# Patient Record
Sex: Female | Born: 1935 | Race: White | Hispanic: No | Marital: Married | Smoking: Never smoker
Health system: Southern US, Community
[De-identification: ages and names within clinical notes are randomized; demographics above are authoritative.]

## PROBLEM LIST (undated history)

## (undated) DIAGNOSIS — J449 Chronic obstructive pulmonary disease, unspecified: Secondary | ICD-10-CM

## (undated) DIAGNOSIS — I509 Heart failure, unspecified: Secondary | ICD-10-CM

## (undated) DIAGNOSIS — E785 Hyperlipidemia, unspecified: Secondary | ICD-10-CM

## (undated) DIAGNOSIS — E119 Type 2 diabetes mellitus without complications: Secondary | ICD-10-CM

## (undated) DIAGNOSIS — K7460 Unspecified cirrhosis of liver: Secondary | ICD-10-CM

## (undated) HISTORY — PX: ESOPHAGOGASTRODUODENOSCOPY: SHX1529

## (undated) HISTORY — PX: BLADDER REPAIR: SHX76

## (undated) HISTORY — DX: Hyperlipidemia, unspecified: E78.5

## (undated) HISTORY — PX: APPENDECTOMY: SHX54

## (undated) HISTORY — PX: GALLBLADDER SURGERY: SHX652

## (undated) HISTORY — PX: COLONOSCOPY: SHX174

## (undated) HISTORY — PX: ABDOMINAL HYSTERECTOMY: SHX81

---

## 2007-03-15 ENCOUNTER — Encounter (INDEPENDENT_AMBULATORY_CARE_PROVIDER_SITE_OTHER): Payer: Self-pay | Admitting: Interventional Radiology

## 2007-03-15 ENCOUNTER — Ambulatory Visit (HOSPITAL_COMMUNITY): Admission: RE | Admit: 2007-03-15 | Discharge: 2007-03-15 | Payer: Self-pay | Admitting: Internal Medicine

## 2007-04-05 ENCOUNTER — Ambulatory Visit (HOSPITAL_COMMUNITY): Admission: RE | Admit: 2007-04-05 | Discharge: 2007-04-05 | Payer: Self-pay | Admitting: Internal Medicine

## 2007-05-23 ENCOUNTER — Ambulatory Visit: Payer: Self-pay | Admitting: Cardiology

## 2007-07-02 ENCOUNTER — Ambulatory Visit: Payer: Self-pay | Admitting: Cardiology

## 2007-07-29 ENCOUNTER — Ambulatory Visit: Payer: Self-pay | Admitting: Cardiology

## 2007-09-13 ENCOUNTER — Ambulatory Visit (HOSPITAL_COMMUNITY): Admission: RE | Admit: 2007-09-13 | Discharge: 2007-09-13 | Payer: Self-pay | Admitting: Internal Medicine

## 2010-02-12 ENCOUNTER — Ambulatory Visit (HOSPITAL_COMMUNITY)
Admission: RE | Admit: 2010-02-12 | Discharge: 2010-02-12 | Payer: Self-pay | Source: Home / Self Care | Attending: Internal Medicine | Admitting: Internal Medicine

## 2010-03-31 ENCOUNTER — Encounter (HOSPITAL_COMMUNITY): Payer: Self-pay | Admitting: Internal Medicine

## 2010-04-05 ENCOUNTER — Ambulatory Visit (HOSPITAL_COMMUNITY)
Admission: RE | Admit: 2010-04-05 | Discharge: 2010-04-05 | Payer: Self-pay | Source: Home / Self Care | Attending: Internal Medicine | Admitting: Internal Medicine

## 2010-04-08 ENCOUNTER — Ambulatory Visit: Admit: 2010-04-08 | Payer: Self-pay | Admitting: Internal Medicine

## 2010-04-13 NOTE — Op Note (Signed)
  NAMELATERA, MCLIN             ACCOUNT NO.:  [id number]  MEDICAL RECORD NO.:  [id number]          PATIENT TYPE:  AMB  LOCATION:  DAY                           FACILITY:  [facility]  PHYSICIAN:  Lionel December, M.D.    DATE OF BIRTH:  Sep 05, 1935  DATE OF PROCEDURE:  04/05/2010 DATE OF DISCHARGE:  04/05/2010                              OPERATIVE REPORT   PROCEDURE:  Small bowel given capsule study.  INDICATION:  Ms. Renninger is 75 year old Caucasian female with GI bleed of occult origin and anemia.  She presented with history of dark stools and/or melena and anemia and received 2 units of PRBCs.  She had EGD and a colonoscopy at [facility] in [place] and no source of blood loss was found.  She was therefore referred over for small bowel given capsule study.  The patient told us that she had an episode back in 2009 and required 2 units of PRBCs prior to diagnosis and therapy of her lymphoma.  Procedure risks were reviewed with the patient and informed consent was obtained.  FINDINGS:  The patient was able to swallow given capsule without any difficulty.  Capsule reached her stomach in less than 1 minute and the duodenal bulb in 33 minutes and 35 seconds and ileocecal valve in 2 hours 51 minutes and 38 seconds and cecum at 2 hours 51 minutes and 39 seconds.  There was focal prominence to bulbar mucosa but this was only seen in frame at 35 minutes and 0 second and felt to be possibly an artifact. Rest of the small bowel mucosa was normal.  In the stomach, there are multiple telangiectasia at antrum.  FINAL DIAGNOSES: 1. 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 2. Normal small bowel study.  RECOMMENDATIONS:  The patient advised to continue hold off oral iron therapy.  If she experience melena, we would consider EGD with APC ablation of these vascular lesions in the stomach.  However, if she has bright red blood per rectum, may consider GI bleeding  scan first.  If we are not able to maintain her hemoglobin up to a respectable level or above 10 g and her stools are always heme-positive, we would consider exam in her upper GI tract and APC ablation of these telangiectasia.     Lionel December, M.D.     NR/MEDQ  D:  04/11/2010  T:  04/12/2010  Job:  [id number]  cc:   Lia Hopping Fax: [phone]  Normand Sloop, MD Fax: [phone]  Electronically Signed by Lionel December M.D. on 04/13/2010 02:02:25 PM

## 2010-07-12 DIAGNOSIS — R079 Chest pain, unspecified: Secondary | ICD-10-CM

## 2010-07-23 NOTE — Assessment & Plan Note (Signed)
[facility]                          [facility] OFFICE NOTE   NAME:Brooke Gutierrez, Brooke Gutierrez                    MRN:          [id number]  DATE:07/02/2007                            DOB:          28-Feb-1936    REFERRING PHYSICIAN:  Erasmo Downer, MD   REFERRING PHYSICIAN:  Dr. Erasmo Downer and Dr. Ubaldo Glassing.   HISTORY OF PRESENT ILLNESS:  The patient is a 75 year old female with  Hodgkin's lymphoma undergoing chemotherapy.  The patient was  hospitalized in March with ectopic atrial tachycardia and started on  Cardizem.  She reports now several recurrences of palpitations.  She was  also admitted on June 11, 2007, with hyperglycemia.  Although the  records indicate the patient has history of atrial fibrillation this  is not correct and this was never documented.  All prior tracings  demonstrate a narrow complex QRS tachycardia.  The patient states that  on exertion and sometimes at rest she has palpitations almost occurring  on a daily basis.  She has no chest pain or shortness of breath.   MEDICATIONS:  1. Lisinopril 5 mg a day.  2. Nitrofur 100 mg p.o. daily.  3. Imipramine.  4. TriCor.  5. Metformin.  6. Diltiazem 360 daily.  7. Protonix 40 daily.  8. Humalog.  9. NovoLog.  10.Lantus insulin.   PHYSICAL EXAMINATION:  VITAL SIGNS:  Blood pressure is 102/64, heart  rate 93, weight 154 pounds.  NECK:  Normal carotid upstroke, no carotid bruits.  LUNGS:  Clear breath sounds bilaterally.  HEART:  Regular rate and rhythm.  Normal S1, S2.  No murmurs, rubs or  gallops.  ABDOMEN:  Soft, nontender, no rebound or guarding.  Good bowel sounds.  EXTREMITIES:  No clubbing, cyanosis or edema.   PROBLEM LIST:  1. History of Hodgkin's lymphoma.  2. Status post chemotherapy.  3. History of pleuritic chest pain negative for PE by CT scan.  4. Atrial tachycardia, possible recurrence.  5. No evidence of structural heart disease by 2-D echo despite  prior      use of Adriamycin.   PLAN:  1. A followup echo will be obtained in the next couple of weeks but I      do not think the patient has cardiomyopathy.  2. The patient will be given a cardiac monitor to reassess her      arrhythmia burden.  3. Have given the patient a prescription for Lopressor as needed if      she has recurrent palpitations to use in conjunction with her      Cardizem.     Learta Codding, MD,FACC  Electronically Signed    GED/MedQ  DD: 07/02/2007  DT: 07/02/2007  Job #: [id number]   cc:   Lebron Conners. Darovsky, M.D.  Erasmo Downer, MD

## 2010-11-28 LAB — CBC
HCT: 35.2 — ABNORMAL LOW
MCV: 83.4
Platelets: 200
WBC: 4.3

## 2011-05-06 DIAGNOSIS — I5033 Acute on chronic diastolic (congestive) heart failure: Secondary | ICD-10-CM

## 2011-08-01 ENCOUNTER — Encounter: Payer: Medicare Other | Admitting: Internal Medicine

## 2011-08-01 DIAGNOSIS — D696 Thrombocytopenia, unspecified: Secondary | ICD-10-CM

## 2011-08-01 DIAGNOSIS — R161 Splenomegaly, not elsewhere classified: Secondary | ICD-10-CM

## 2011-08-01 DIAGNOSIS — D649 Anemia, unspecified: Secondary | ICD-10-CM

## 2011-08-01 DIAGNOSIS — C8190 Hodgkin lymphoma, unspecified, unspecified site: Secondary | ICD-10-CM

## 2011-09-12 DIAGNOSIS — C8589 Other specified types of non-Hodgkin lymphoma, extranodal and solid organ sites: Secondary | ICD-10-CM

## 2011-09-12 DIAGNOSIS — Z452 Encounter for adjustment and management of vascular access device: Secondary | ICD-10-CM

## 2011-09-25 ENCOUNTER — Encounter (INDEPENDENT_AMBULATORY_CARE_PROVIDER_SITE_OTHER): Payer: Medicare Other | Admitting: Internal Medicine

## 2011-09-25 DIAGNOSIS — C8190 Hodgkin lymphoma, unspecified, unspecified site: Secondary | ICD-10-CM

## 2011-09-25 DIAGNOSIS — K7460 Unspecified cirrhosis of liver: Secondary | ICD-10-CM

## 2011-10-28 DIAGNOSIS — C8589 Other specified types of non-Hodgkin lymphoma, extranodal and solid organ sites: Secondary | ICD-10-CM

## 2011-10-28 DIAGNOSIS — Z452 Encounter for adjustment and management of vascular access device: Secondary | ICD-10-CM

## 2011-12-10 ENCOUNTER — Encounter (INDEPENDENT_AMBULATORY_CARE_PROVIDER_SITE_OTHER): Payer: Self-pay | Admitting: Internal Medicine

## 2011-12-10 DIAGNOSIS — Z452 Encounter for adjustment and management of vascular access device: Secondary | ICD-10-CM

## 2011-12-10 DIAGNOSIS — C8589 Other specified types of non-Hodgkin lymphoma, extranodal and solid organ sites: Secondary | ICD-10-CM

## 2012-01-21 DIAGNOSIS — C8589 Other specified types of non-Hodgkin lymphoma, extranodal and solid organ sites: Secondary | ICD-10-CM

## 2012-01-21 DIAGNOSIS — Z452 Encounter for adjustment and management of vascular access device: Secondary | ICD-10-CM

## 2012-03-01 DIAGNOSIS — Z452 Encounter for adjustment and management of vascular access device: Secondary | ICD-10-CM

## 2012-03-01 DIAGNOSIS — C8190 Hodgkin lymphoma, unspecified, unspecified site: Secondary | ICD-10-CM

## 2012-04-13 DIAGNOSIS — C8190 Hodgkin lymphoma, unspecified, unspecified site: Secondary | ICD-10-CM

## 2012-04-13 DIAGNOSIS — Z452 Encounter for adjustment and management of vascular access device: Secondary | ICD-10-CM

## 2012-05-25 DIAGNOSIS — Z452 Encounter for adjustment and management of vascular access device: Secondary | ICD-10-CM

## 2012-05-25 DIAGNOSIS — C8190 Hodgkin lymphoma, unspecified, unspecified site: Secondary | ICD-10-CM

## 2012-07-06 DIAGNOSIS — C8190 Hodgkin lymphoma, unspecified, unspecified site: Secondary | ICD-10-CM

## 2012-07-06 DIAGNOSIS — Z452 Encounter for adjustment and management of vascular access device: Secondary | ICD-10-CM

## 2012-08-17 DIAGNOSIS — Z452 Encounter for adjustment and management of vascular access device: Secondary | ICD-10-CM

## 2012-08-17 DIAGNOSIS — C8190 Hodgkin lymphoma, unspecified, unspecified site: Secondary | ICD-10-CM

## 2012-09-16 DIAGNOSIS — C8190 Hodgkin lymphoma, unspecified, unspecified site: Secondary | ICD-10-CM

## 2012-09-28 DIAGNOSIS — Z452 Encounter for adjustment and management of vascular access device: Secondary | ICD-10-CM

## 2012-09-28 DIAGNOSIS — C8190 Hodgkin lymphoma, unspecified, unspecified site: Secondary | ICD-10-CM

## 2012-11-09 DIAGNOSIS — Z452 Encounter for adjustment and management of vascular access device: Secondary | ICD-10-CM

## 2012-11-09 DIAGNOSIS — C8589 Other specified types of non-Hodgkin lymphoma, extranodal and solid organ sites: Secondary | ICD-10-CM

## 2012-12-22 DIAGNOSIS — Z452 Encounter for adjustment and management of vascular access device: Secondary | ICD-10-CM

## 2012-12-22 DIAGNOSIS — C8190 Hodgkin lymphoma, unspecified, unspecified site: Secondary | ICD-10-CM

## 2013-02-02 DIAGNOSIS — Z452 Encounter for adjustment and management of vascular access device: Secondary | ICD-10-CM

## 2013-02-02 DIAGNOSIS — C8190 Hodgkin lymphoma, unspecified, unspecified site: Secondary | ICD-10-CM

## 2014-10-11 ENCOUNTER — Other Ambulatory Visit: Payer: Self-pay | Admitting: Internal Medicine

## 2014-10-11 DIAGNOSIS — R921 Mammographic calcification found on diagnostic imaging of breast: Secondary | ICD-10-CM

## 2014-10-17 ENCOUNTER — Ambulatory Visit
Admission: RE | Admit: 2014-10-17 | Discharge: 2014-10-17 | Disposition: A | Discharge status: Home / Self Care | Payer: Medicare Other | Source: Ambulatory Visit | Attending: Internal Medicine | Admitting: Internal Medicine

## 2014-10-17 ENCOUNTER — Other Ambulatory Visit: Payer: Self-pay | Admitting: Internal Medicine

## 2014-10-17 DIAGNOSIS — R921 Mammographic calcification found on diagnostic imaging of breast: Secondary | ICD-10-CM

## 2015-04-16 DIAGNOSIS — J45909 Unspecified asthma, uncomplicated: Secondary | ICD-10-CM | POA: Diagnosis not present

## 2015-04-16 DIAGNOSIS — J441 Chronic obstructive pulmonary disease with (acute) exacerbation: Secondary | ICD-10-CM | POA: Diagnosis not present

## 2015-04-16 DIAGNOSIS — C8588 Other specified types of non-Hodgkin lymphoma, lymph nodes of multiple sites: Secondary | ICD-10-CM | POA: Diagnosis not present

## 2015-04-16 DIAGNOSIS — E785 Hyperlipidemia, unspecified: Secondary | ICD-10-CM | POA: Diagnosis not present

## 2015-04-16 DIAGNOSIS — G8929 Other chronic pain: Secondary | ICD-10-CM | POA: Diagnosis not present

## 2015-04-16 DIAGNOSIS — K7581 Nonalcoholic steatohepatitis (NASH): Secondary | ICD-10-CM | POA: Diagnosis not present

## 2015-04-16 DIAGNOSIS — C8590 Non-Hodgkin lymphoma, unspecified, unspecified site: Secondary | ICD-10-CM | POA: Diagnosis not present

## 2015-04-16 DIAGNOSIS — E119 Type 2 diabetes mellitus without complications: Secondary | ICD-10-CM | POA: Diagnosis not present

## 2015-04-16 DIAGNOSIS — N12 Tubulo-interstitial nephritis, not specified as acute or chronic: Secondary | ICD-10-CM | POA: Diagnosis not present

## 2015-04-16 DIAGNOSIS — R52 Pain, unspecified: Secondary | ICD-10-CM | POA: Diagnosis not present

## 2015-04-16 DIAGNOSIS — I10 Essential (primary) hypertension: Secondary | ICD-10-CM | POA: Diagnosis not present

## 2015-04-16 DIAGNOSIS — J449 Chronic obstructive pulmonary disease, unspecified: Secondary | ICD-10-CM | POA: Diagnosis not present

## 2015-04-26 DIAGNOSIS — N3000 Acute cystitis without hematuria: Secondary | ICD-10-CM | POA: Diagnosis not present

## 2015-04-26 DIAGNOSIS — Z6830 Body mass index (BMI) 30.0-30.9, adult: Secondary | ICD-10-CM | POA: Diagnosis not present

## 2015-04-27 DIAGNOSIS — E119 Type 2 diabetes mellitus without complications: Secondary | ICD-10-CM | POA: Diagnosis not present

## 2015-05-03 DIAGNOSIS — Z95828 Presence of other vascular implants and grafts: Secondary | ICD-10-CM | POA: Diagnosis not present

## 2015-05-03 DIAGNOSIS — I864 Gastric varices: Secondary | ICD-10-CM | POA: Diagnosis not present

## 2015-05-03 DIAGNOSIS — D638 Anemia in other chronic diseases classified elsewhere: Secondary | ICD-10-CM | POA: Diagnosis not present

## 2015-05-03 DIAGNOSIS — C8120 Mixed cellularity classical Hodgkin lymphoma, unspecified site: Secondary | ICD-10-CM | POA: Diagnosis not present

## 2015-05-03 DIAGNOSIS — D696 Thrombocytopenia, unspecified: Secondary | ICD-10-CM | POA: Diagnosis not present

## 2015-05-03 DIAGNOSIS — K745 Biliary cirrhosis, unspecified: Secondary | ICD-10-CM | POA: Diagnosis not present

## 2015-05-08 DIAGNOSIS — R339 Retention of urine, unspecified: Secondary | ICD-10-CM | POA: Diagnosis not present

## 2015-05-28 DIAGNOSIS — Z95828 Presence of other vascular implants and grafts: Secondary | ICD-10-CM | POA: Diagnosis not present

## 2015-06-12 DIAGNOSIS — R339 Retention of urine, unspecified: Secondary | ICD-10-CM | POA: Diagnosis not present

## 2015-06-22 DIAGNOSIS — E1165 Type 2 diabetes mellitus with hyperglycemia: Secondary | ICD-10-CM | POA: Diagnosis not present

## 2015-07-09 DIAGNOSIS — Z95828 Presence of other vascular implants and grafts: Secondary | ICD-10-CM | POA: Diagnosis not present

## 2015-07-10 DIAGNOSIS — R339 Retention of urine, unspecified: Secondary | ICD-10-CM | POA: Diagnosis not present

## 2015-07-30 DIAGNOSIS — I10 Essential (primary) hypertension: Secondary | ICD-10-CM | POA: Diagnosis not present

## 2015-07-30 DIAGNOSIS — Z6830 Body mass index (BMI) 30.0-30.9, adult: Secondary | ICD-10-CM | POA: Diagnosis not present

## 2015-07-30 DIAGNOSIS — E119 Type 2 diabetes mellitus without complications: Secondary | ICD-10-CM | POA: Diagnosis not present

## 2015-07-30 DIAGNOSIS — E1165 Type 2 diabetes mellitus with hyperglycemia: Secondary | ICD-10-CM | POA: Diagnosis not present

## 2015-07-30 DIAGNOSIS — Z79899 Other long term (current) drug therapy: Secondary | ICD-10-CM | POA: Diagnosis not present

## 2015-07-30 DIAGNOSIS — K7469 Other cirrhosis of liver: Secondary | ICD-10-CM | POA: Diagnosis not present

## 2015-08-03 DIAGNOSIS — K140 Glossitis: Secondary | ICD-10-CM | POA: Diagnosis not present

## 2015-08-07 DIAGNOSIS — R339 Retention of urine, unspecified: Secondary | ICD-10-CM | POA: Diagnosis not present

## 2015-08-20 DIAGNOSIS — Z95828 Presence of other vascular implants and grafts: Secondary | ICD-10-CM | POA: Diagnosis not present

## 2015-08-23 DIAGNOSIS — E119 Type 2 diabetes mellitus without complications: Secondary | ICD-10-CM | POA: Diagnosis not present

## 2015-08-23 DIAGNOSIS — H43812 Vitreous degeneration, left eye: Secondary | ICD-10-CM | POA: Diagnosis not present

## 2015-08-23 DIAGNOSIS — Z961 Presence of intraocular lens: Secondary | ICD-10-CM | POA: Diagnosis not present

## 2015-09-05 DIAGNOSIS — R339 Retention of urine, unspecified: Secondary | ICD-10-CM | POA: Diagnosis not present

## 2015-09-05 DIAGNOSIS — N390 Urinary tract infection, site not specified: Secondary | ICD-10-CM | POA: Diagnosis not present

## 2015-09-07 DIAGNOSIS — R339 Retention of urine, unspecified: Secondary | ICD-10-CM | POA: Diagnosis not present

## 2015-09-10 DIAGNOSIS — E1165 Type 2 diabetes mellitus with hyperglycemia: Secondary | ICD-10-CM | POA: Diagnosis not present

## 2015-09-13 DIAGNOSIS — N390 Urinary tract infection, site not specified: Secondary | ICD-10-CM | POA: Diagnosis not present

## 2015-09-14 DIAGNOSIS — N390 Urinary tract infection, site not specified: Secondary | ICD-10-CM | POA: Diagnosis not present

## 2015-09-14 DIAGNOSIS — K140 Glossitis: Secondary | ICD-10-CM | POA: Diagnosis not present

## 2015-09-15 DIAGNOSIS — N390 Urinary tract infection, site not specified: Secondary | ICD-10-CM | POA: Diagnosis not present

## 2015-09-17 DIAGNOSIS — N390 Urinary tract infection, site not specified: Secondary | ICD-10-CM | POA: Diagnosis not present

## 2015-09-17 DIAGNOSIS — R339 Retention of urine, unspecified: Secondary | ICD-10-CM | POA: Diagnosis not present

## 2015-09-18 DIAGNOSIS — N390 Urinary tract infection, site not specified: Secondary | ICD-10-CM | POA: Diagnosis not present

## 2015-09-19 DIAGNOSIS — N390 Urinary tract infection, site not specified: Secondary | ICD-10-CM | POA: Diagnosis not present

## 2015-09-20 DIAGNOSIS — N390 Urinary tract infection, site not specified: Secondary | ICD-10-CM | POA: Diagnosis not present

## 2015-09-21 DIAGNOSIS — N390 Urinary tract infection, site not specified: Secondary | ICD-10-CM | POA: Diagnosis not present

## 2015-09-22 DIAGNOSIS — N390 Urinary tract infection, site not specified: Secondary | ICD-10-CM | POA: Diagnosis not present

## 2015-10-01 ENCOUNTER — Encounter (HOSPITAL_COMMUNITY): Payer: Medicare Other

## 2015-10-01 DIAGNOSIS — Z452 Encounter for adjustment and management of vascular access device: Secondary | ICD-10-CM | POA: Diagnosis not present

## 2015-10-03 DIAGNOSIS — N390 Urinary tract infection, site not specified: Secondary | ICD-10-CM | POA: Diagnosis not present

## 2015-10-03 DIAGNOSIS — R339 Retention of urine, unspecified: Secondary | ICD-10-CM | POA: Diagnosis not present

## 2015-10-08 DIAGNOSIS — R339 Retention of urine, unspecified: Secondary | ICD-10-CM | POA: Diagnosis not present

## 2015-10-30 DIAGNOSIS — I10 Essential (primary) hypertension: Secondary | ICD-10-CM | POA: Diagnosis not present

## 2015-10-30 DIAGNOSIS — Z6831 Body mass index (BMI) 31.0-31.9, adult: Secondary | ICD-10-CM | POA: Diagnosis not present

## 2015-10-30 DIAGNOSIS — K210 Gastro-esophageal reflux disease with esophagitis: Secondary | ICD-10-CM | POA: Diagnosis not present

## 2015-10-30 DIAGNOSIS — K7469 Other cirrhosis of liver: Secondary | ICD-10-CM | POA: Diagnosis not present

## 2015-10-30 DIAGNOSIS — E119 Type 2 diabetes mellitus without complications: Secondary | ICD-10-CM | POA: Diagnosis not present

## 2015-11-07 DIAGNOSIS — R339 Retention of urine, unspecified: Secondary | ICD-10-CM | POA: Diagnosis not present

## 2015-11-13 DIAGNOSIS — Z452 Encounter for adjustment and management of vascular access device: Secondary | ICD-10-CM | POA: Diagnosis not present

## 2015-11-15 DIAGNOSIS — N390 Urinary tract infection, site not specified: Secondary | ICD-10-CM | POA: Diagnosis not present

## 2015-11-15 DIAGNOSIS — R3915 Urgency of urination: Secondary | ICD-10-CM | POA: Diagnosis not present

## 2015-11-19 DIAGNOSIS — E1165 Type 2 diabetes mellitus with hyperglycemia: Secondary | ICD-10-CM | POA: Diagnosis not present

## 2015-11-28 DIAGNOSIS — R3915 Urgency of urination: Secondary | ICD-10-CM | POA: Diagnosis not present

## 2015-12-10 DIAGNOSIS — R339 Retention of urine, unspecified: Secondary | ICD-10-CM | POA: Diagnosis not present

## 2015-12-25 DIAGNOSIS — Z452 Encounter for adjustment and management of vascular access device: Secondary | ICD-10-CM | POA: Diagnosis not present

## 2015-12-26 DIAGNOSIS — C44722 Squamous cell carcinoma of skin of right lower limb, including hip: Secondary | ICD-10-CM | POA: Diagnosis not present

## 2015-12-26 DIAGNOSIS — L821 Other seborrheic keratosis: Secondary | ICD-10-CM | POA: Diagnosis not present

## 2015-12-26 DIAGNOSIS — L570 Actinic keratosis: Secondary | ICD-10-CM | POA: Diagnosis not present

## 2015-12-26 DIAGNOSIS — L720 Epidermal cyst: Secondary | ICD-10-CM | POA: Diagnosis not present

## 2015-12-26 DIAGNOSIS — L309 Dermatitis, unspecified: Secondary | ICD-10-CM | POA: Diagnosis not present

## 2015-12-26 DIAGNOSIS — D485 Neoplasm of uncertain behavior of skin: Secondary | ICD-10-CM | POA: Diagnosis not present

## 2016-01-07 DIAGNOSIS — R339 Retention of urine, unspecified: Secondary | ICD-10-CM | POA: Diagnosis not present

## 2016-01-23 DIAGNOSIS — N390 Urinary tract infection, site not specified: Secondary | ICD-10-CM | POA: Diagnosis not present

## 2016-01-23 DIAGNOSIS — R300 Dysuria: Secondary | ICD-10-CM | POA: Diagnosis not present

## 2016-01-24 DIAGNOSIS — D485 Neoplasm of uncertain behavior of skin: Secondary | ICD-10-CM | POA: Diagnosis not present

## 2016-01-24 DIAGNOSIS — C44722 Squamous cell carcinoma of skin of right lower limb, including hip: Secondary | ICD-10-CM | POA: Diagnosis not present

## 2016-01-24 DIAGNOSIS — D0472 Carcinoma in situ of skin of left lower limb, including hip: Secondary | ICD-10-CM | POA: Diagnosis not present

## 2016-01-24 DIAGNOSIS — L570 Actinic keratosis: Secondary | ICD-10-CM | POA: Diagnosis not present

## 2016-01-24 DIAGNOSIS — D0471 Carcinoma in situ of skin of right lower limb, including hip: Secondary | ICD-10-CM | POA: Diagnosis not present

## 2016-01-28 DIAGNOSIS — N390 Urinary tract infection, site not specified: Secondary | ICD-10-CM | POA: Diagnosis not present

## 2016-01-28 DIAGNOSIS — L0100 Impetigo, unspecified: Secondary | ICD-10-CM | POA: Diagnosis not present

## 2016-01-29 DIAGNOSIS — N390 Urinary tract infection, site not specified: Secondary | ICD-10-CM | POA: Diagnosis not present

## 2016-01-30 DIAGNOSIS — I10 Essential (primary) hypertension: Secondary | ICD-10-CM | POA: Diagnosis not present

## 2016-01-30 DIAGNOSIS — N390 Urinary tract infection, site not specified: Secondary | ICD-10-CM | POA: Diagnosis not present

## 2016-01-30 DIAGNOSIS — Z6831 Body mass index (BMI) 31.0-31.9, adult: Secondary | ICD-10-CM | POA: Diagnosis not present

## 2016-01-30 DIAGNOSIS — E119 Type 2 diabetes mellitus without complications: Secondary | ICD-10-CM | POA: Diagnosis not present

## 2016-01-30 DIAGNOSIS — Z0000 Encounter for general adult medical examination without abnormal findings: Secondary | ICD-10-CM | POA: Diagnosis not present

## 2016-01-30 DIAGNOSIS — K210 Gastro-esophageal reflux disease with esophagitis: Secondary | ICD-10-CM | POA: Diagnosis not present

## 2016-01-30 DIAGNOSIS — K7469 Other cirrhosis of liver: Secondary | ICD-10-CM | POA: Diagnosis not present

## 2016-01-30 DIAGNOSIS — Z1389 Encounter for screening for other disorder: Secondary | ICD-10-CM | POA: Diagnosis not present

## 2016-01-31 DIAGNOSIS — N390 Urinary tract infection, site not specified: Secondary | ICD-10-CM | POA: Diagnosis not present

## 2016-01-31 DIAGNOSIS — Z881 Allergy status to other antibiotic agents status: Secondary | ICD-10-CM | POA: Diagnosis not present

## 2016-02-01 DIAGNOSIS — N390 Urinary tract infection, site not specified: Secondary | ICD-10-CM | POA: Diagnosis not present

## 2016-02-02 DIAGNOSIS — N390 Urinary tract infection, site not specified: Secondary | ICD-10-CM | POA: Diagnosis not present

## 2016-02-04 DIAGNOSIS — N390 Urinary tract infection, site not specified: Secondary | ICD-10-CM | POA: Diagnosis not present

## 2016-02-05 DIAGNOSIS — N390 Urinary tract infection, site not specified: Secondary | ICD-10-CM | POA: Diagnosis not present

## 2016-02-05 DIAGNOSIS — Z452 Encounter for adjustment and management of vascular access device: Secondary | ICD-10-CM | POA: Diagnosis not present

## 2016-02-05 DIAGNOSIS — E1165 Type 2 diabetes mellitus with hyperglycemia: Secondary | ICD-10-CM | POA: Diagnosis not present

## 2016-02-06 DIAGNOSIS — N390 Urinary tract infection, site not specified: Secondary | ICD-10-CM | POA: Diagnosis not present

## 2016-02-07 DIAGNOSIS — R339 Retention of urine, unspecified: Secondary | ICD-10-CM | POA: Diagnosis not present

## 2016-02-14 DIAGNOSIS — C44722 Squamous cell carcinoma of skin of right lower limb, including hip: Secondary | ICD-10-CM | POA: Diagnosis not present

## 2016-02-20 DIAGNOSIS — N390 Urinary tract infection, site not specified: Secondary | ICD-10-CM | POA: Diagnosis not present

## 2016-02-20 DIAGNOSIS — R3915 Urgency of urination: Secondary | ICD-10-CM | POA: Diagnosis not present

## 2016-02-22 DIAGNOSIS — N390 Urinary tract infection, site not specified: Secondary | ICD-10-CM | POA: Diagnosis not present

## 2016-02-23 DIAGNOSIS — N390 Urinary tract infection, site not specified: Secondary | ICD-10-CM | POA: Diagnosis not present

## 2016-02-25 DIAGNOSIS — N390 Urinary tract infection, site not specified: Secondary | ICD-10-CM | POA: Diagnosis not present

## 2016-02-26 DIAGNOSIS — N390 Urinary tract infection, site not specified: Secondary | ICD-10-CM | POA: Diagnosis not present

## 2016-02-27 DIAGNOSIS — N390 Urinary tract infection, site not specified: Secondary | ICD-10-CM | POA: Diagnosis not present

## 2016-02-28 DIAGNOSIS — N390 Urinary tract infection, site not specified: Secondary | ICD-10-CM | POA: Diagnosis not present

## 2016-02-28 DIAGNOSIS — C44722 Squamous cell carcinoma of skin of right lower limb, including hip: Secondary | ICD-10-CM | POA: Diagnosis not present

## 2016-02-29 DIAGNOSIS — N390 Urinary tract infection, site not specified: Secondary | ICD-10-CM | POA: Diagnosis not present

## 2016-03-01 DIAGNOSIS — N390 Urinary tract infection, site not specified: Secondary | ICD-10-CM | POA: Diagnosis not present

## 2016-03-02 DIAGNOSIS — N390 Urinary tract infection, site not specified: Secondary | ICD-10-CM | POA: Diagnosis not present

## 2016-03-11 DIAGNOSIS — R339 Retention of urine, unspecified: Secondary | ICD-10-CM | POA: Diagnosis not present

## 2016-03-18 DIAGNOSIS — Z452 Encounter for adjustment and management of vascular access device: Secondary | ICD-10-CM | POA: Diagnosis not present

## 2016-03-19 DIAGNOSIS — N390 Urinary tract infection, site not specified: Secondary | ICD-10-CM | POA: Diagnosis not present

## 2016-04-08 DIAGNOSIS — R339 Retention of urine, unspecified: Secondary | ICD-10-CM | POA: Diagnosis not present

## 2016-04-15 ENCOUNTER — Ambulatory Visit (HOSPITAL_COMMUNITY): Payer: Medicare Other | Admitting: Hematology & Oncology

## 2016-04-17 DIAGNOSIS — E1165 Type 2 diabetes mellitus with hyperglycemia: Secondary | ICD-10-CM | POA: Diagnosis not present

## 2016-04-29 DIAGNOSIS — Z452 Encounter for adjustment and management of vascular access device: Secondary | ICD-10-CM | POA: Diagnosis not present

## 2016-05-07 DIAGNOSIS — R339 Retention of urine, unspecified: Secondary | ICD-10-CM | POA: Diagnosis not present

## 2016-05-08 DIAGNOSIS — I10 Essential (primary) hypertension: Secondary | ICD-10-CM | POA: Diagnosis not present

## 2016-05-08 DIAGNOSIS — K7469 Other cirrhosis of liver: Secondary | ICD-10-CM | POA: Diagnosis not present

## 2016-05-08 DIAGNOSIS — K210 Gastro-esophageal reflux disease with esophagitis: Secondary | ICD-10-CM | POA: Diagnosis not present

## 2016-05-08 DIAGNOSIS — E119 Type 2 diabetes mellitus without complications: Secondary | ICD-10-CM | POA: Diagnosis not present

## 2016-05-13 ENCOUNTER — Ambulatory Visit (HOSPITAL_COMMUNITY): Payer: Medicare Other

## 2016-06-06 DIAGNOSIS — R339 Retention of urine, unspecified: Secondary | ICD-10-CM | POA: Diagnosis not present

## 2016-06-17 IMAGING — MG MM BREAST BX W/ LOC DEV 1ST LESION IMAGE BX SPEC STEREO GUIDE*R*
7 series · 8 of 19 positions shown · non-contrast
Comparison: Mammograms dated 10/11/2014, 09/22/2014, 07/28/2013,
12/06/2009 from [facility] [REDACTED]

ADDENDUM:
Pathology revealed a fibroadenoma with calcifications in the right
breast. This was found to be concordant by Dr. R Alejandro Basoria.
Pathology was discussed with the patient by telephone. She reported
doing well after the biopsy. Post biopsy instructions and care were
reviewed and her questions were answered. She was encouraged to call
She was asked to return to [facility] [REDACTED] for annual
screening mammography.

Pathology results reported by Massiva Merville RN, BSN on October 19, 2014.
CLINICAL DATA: RIGHT BREAST CALCIFICATIONS.
EXAM:
RIGHT BREAST STEREOTACTIC CORE NEEDLE BIOPSY

[R LM (1 of 4)]
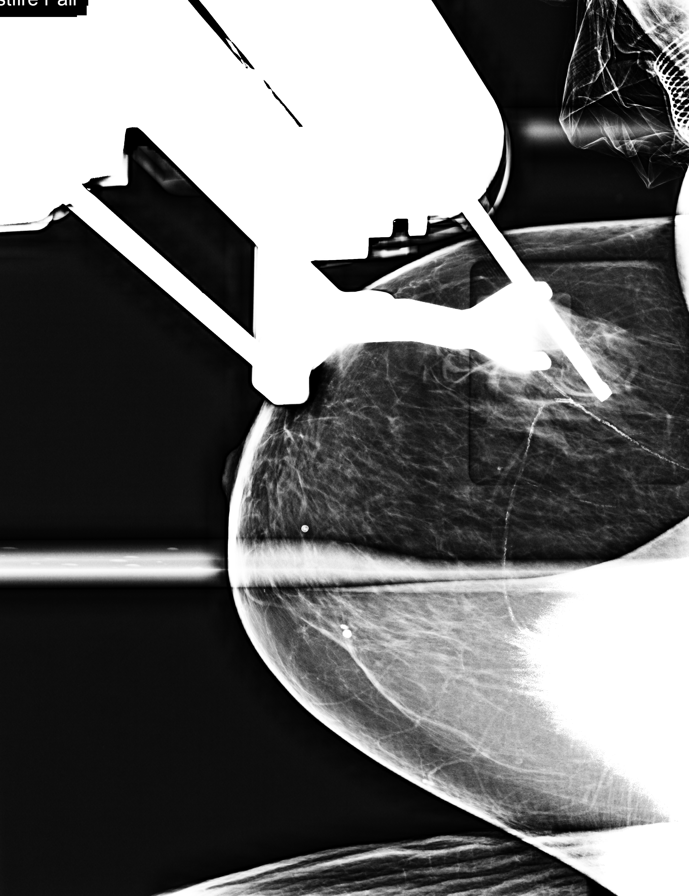

[R LM (2 of 4)]
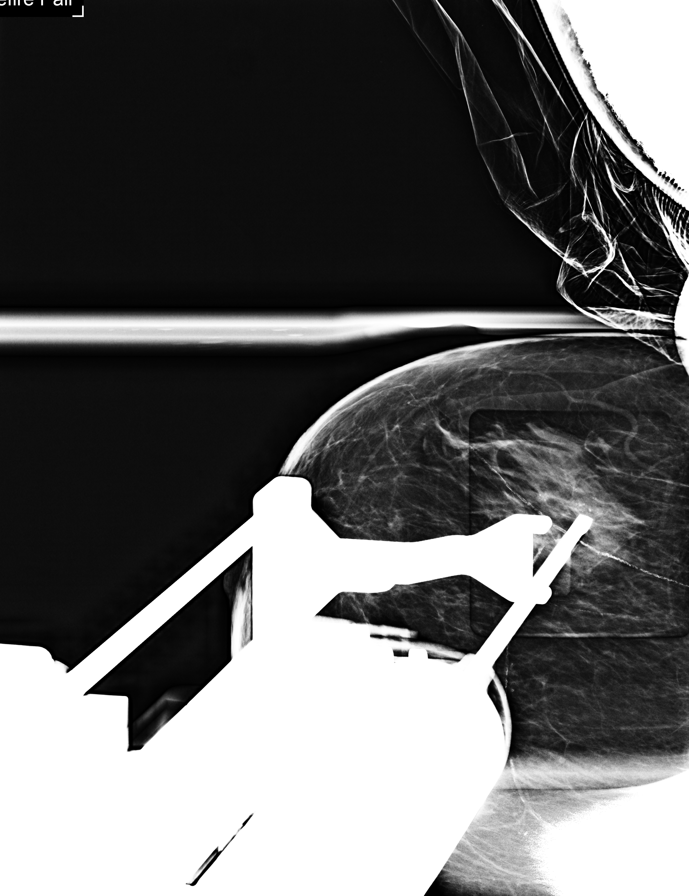

[R LM (3 of 4)]
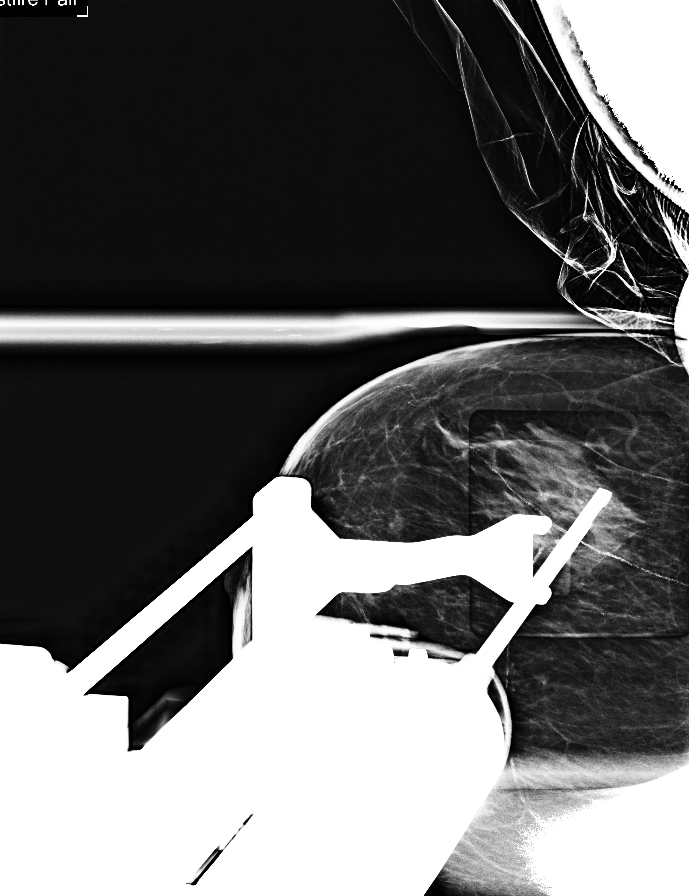

[R LM (4 of 4)]
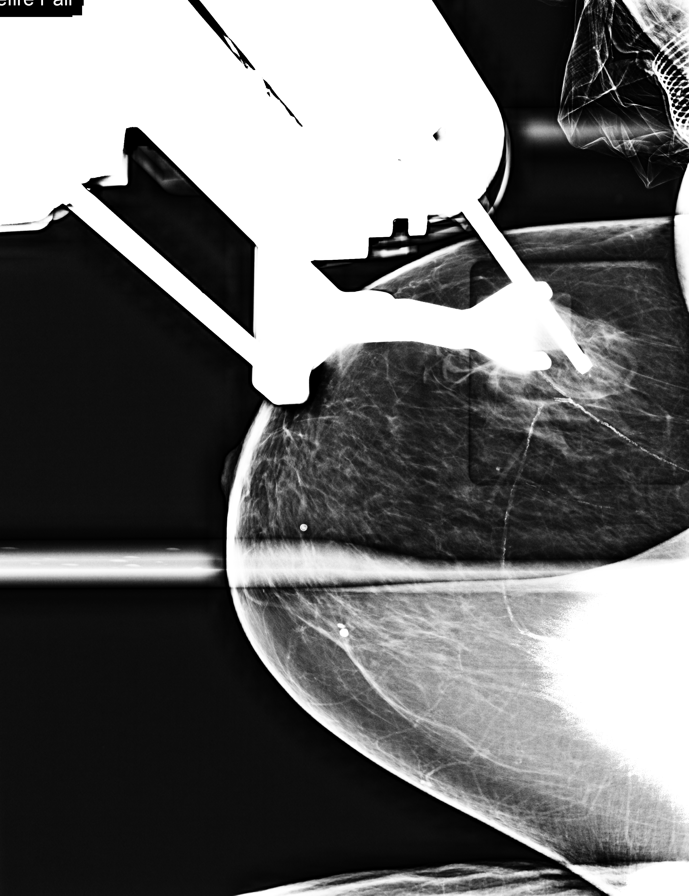

[R LM tomo · 2 of 47 frames shown (1 of 3)]
[frame 16/47]
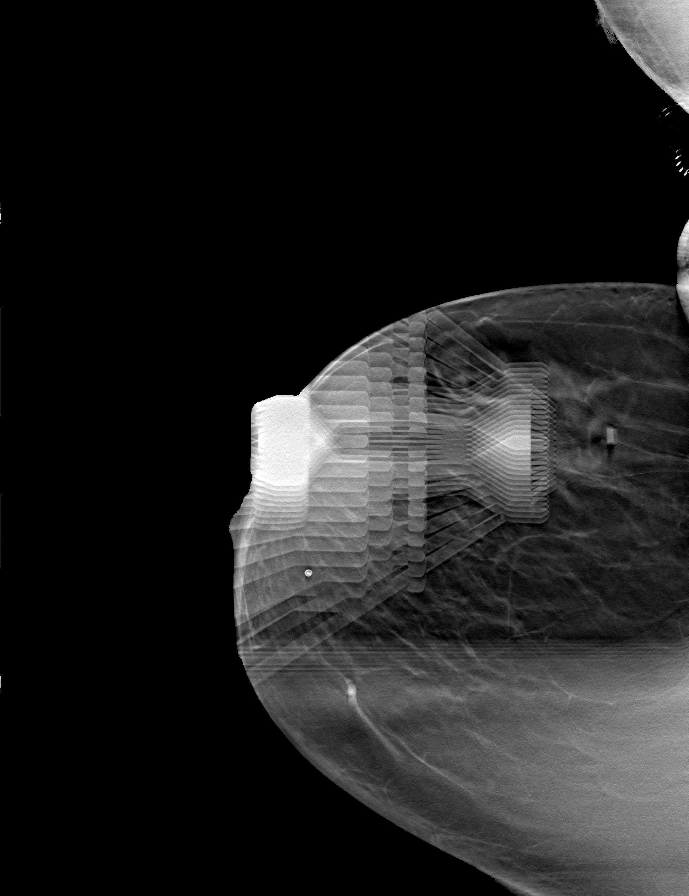
[frame 24/47]
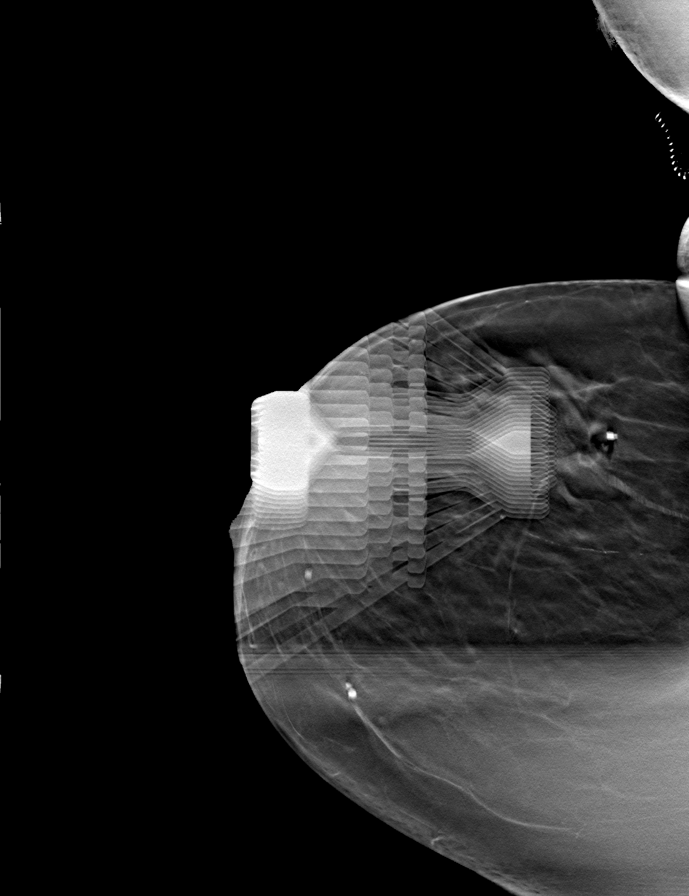

[R LM tomo (2 of 3) · tomo slice 25/48.0]
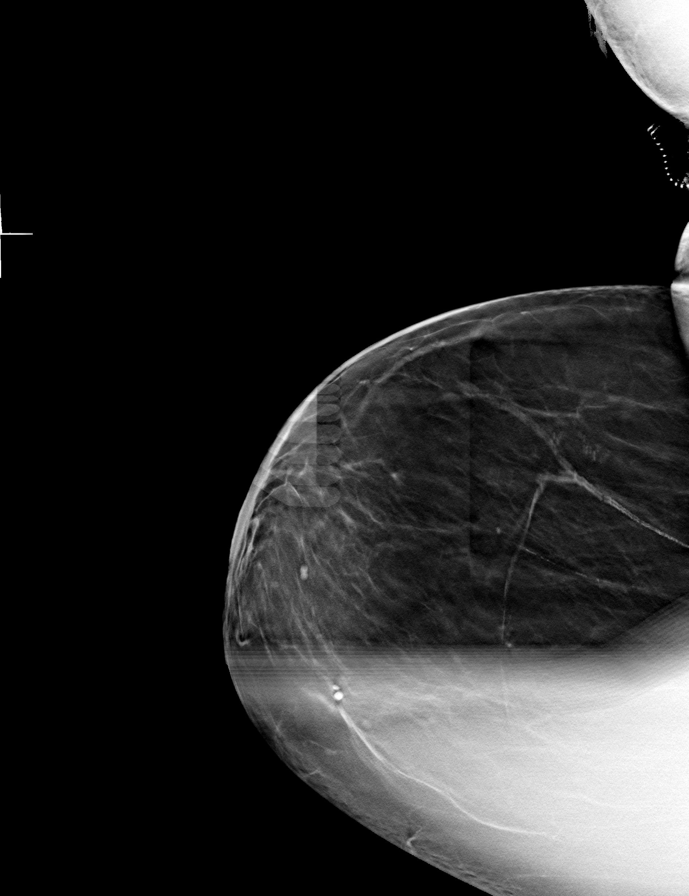

[R LM tomo (3 of 3) · tomo slice 24/47.0]
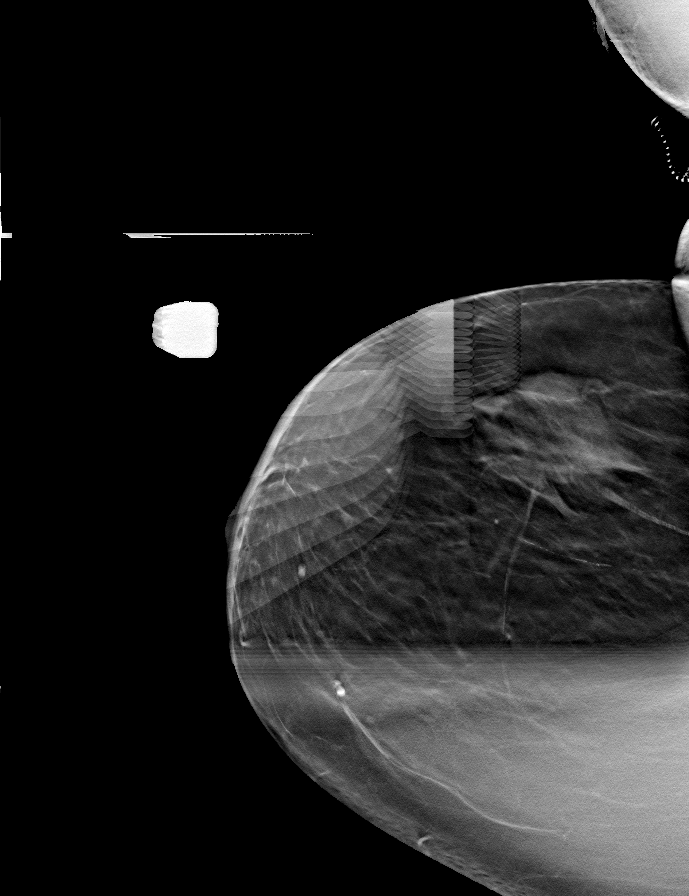

[8 of 19 positions shown; findings below may reference images not displayed]



Using sterile technique and 2% lidocaine as local anesthetic, under
stereotactic guidance, a 9 gauge vacuum assisted device was used to
perform core needle biopsy of calcifications within the lower outer
quadrant right breast using a lateral approach. Specimen radiograph
was performed showing representative calcifications within the
specimen. Specimens with calcifications are identified for
pathology.

At the conclusion of the procedure, a coil shaped tissue marker clip
was deployed into the biopsy cavity. Follow-up 2-view mammogram was
performed and dictated separately.
IMPRESSION: Stereotactic-guided biopsy of right breast calcifications as
discussed. No apparent complications.

## 2016-07-07 DIAGNOSIS — R339 Retention of urine, unspecified: Secondary | ICD-10-CM | POA: Diagnosis not present

## 2016-08-06 DIAGNOSIS — R339 Retention of urine, unspecified: Secondary | ICD-10-CM | POA: Diagnosis not present

## 2016-08-12 DIAGNOSIS — Z79899 Other long term (current) drug therapy: Secondary | ICD-10-CM | POA: Diagnosis not present

## 2016-08-12 DIAGNOSIS — E119 Type 2 diabetes mellitus without complications: Secondary | ICD-10-CM | POA: Diagnosis not present

## 2016-08-12 DIAGNOSIS — N3001 Acute cystitis with hematuria: Secondary | ICD-10-CM | POA: Diagnosis not present

## 2016-08-12 DIAGNOSIS — I10 Essential (primary) hypertension: Secondary | ICD-10-CM | POA: Diagnosis not present

## 2016-08-12 DIAGNOSIS — E1165 Type 2 diabetes mellitus with hyperglycemia: Secondary | ICD-10-CM | POA: Diagnosis not present

## 2016-08-12 DIAGNOSIS — K210 Gastro-esophageal reflux disease with esophagitis: Secondary | ICD-10-CM | POA: Diagnosis not present

## 2016-08-21 DIAGNOSIS — E119 Type 2 diabetes mellitus without complications: Secondary | ICD-10-CM | POA: Diagnosis not present

## 2016-08-21 DIAGNOSIS — Z961 Presence of intraocular lens: Secondary | ICD-10-CM | POA: Diagnosis not present

## 2016-08-21 DIAGNOSIS — H43812 Vitreous degeneration, left eye: Secondary | ICD-10-CM | POA: Diagnosis not present

## 2016-08-26 DIAGNOSIS — L409 Psoriasis, unspecified: Secondary | ICD-10-CM | POA: Diagnosis not present

## 2016-08-26 DIAGNOSIS — L570 Actinic keratosis: Secondary | ICD-10-CM | POA: Diagnosis not present

## 2016-08-26 DIAGNOSIS — Z85828 Personal history of other malignant neoplasm of skin: Secondary | ICD-10-CM | POA: Diagnosis not present

## 2016-09-03 DIAGNOSIS — E1165 Type 2 diabetes mellitus with hyperglycemia: Secondary | ICD-10-CM | POA: Diagnosis not present

## 2016-09-08 DIAGNOSIS — R339 Retention of urine, unspecified: Secondary | ICD-10-CM | POA: Diagnosis not present

## 2016-10-06 DIAGNOSIS — R339 Retention of urine, unspecified: Secondary | ICD-10-CM | POA: Diagnosis not present

## 2016-10-20 DIAGNOSIS — B9629 Other Escherichia coli [E. coli] as the cause of diseases classified elsewhere: Secondary | ICD-10-CM | POA: Diagnosis not present

## 2016-10-20 DIAGNOSIS — J449 Chronic obstructive pulmonary disease, unspecified: Secondary | ICD-10-CM | POA: Diagnosis not present

## 2016-10-20 DIAGNOSIS — I10 Essential (primary) hypertension: Secondary | ICD-10-CM | POA: Diagnosis not present

## 2016-10-20 DIAGNOSIS — C8590 Non-Hodgkin lymphoma, unspecified, unspecified site: Secondary | ICD-10-CM | POA: Diagnosis not present

## 2016-10-20 DIAGNOSIS — R0602 Shortness of breath: Secondary | ICD-10-CM | POA: Diagnosis not present

## 2016-10-20 DIAGNOSIS — K7581 Nonalcoholic steatohepatitis (NASH): Secondary | ICD-10-CM | POA: Diagnosis not present

## 2016-10-20 DIAGNOSIS — B9620 Unspecified Escherichia coli [E. coli] as the cause of diseases classified elsewhere: Secondary | ICD-10-CM | POA: Diagnosis not present

## 2016-10-20 DIAGNOSIS — E119 Type 2 diabetes mellitus without complications: Secondary | ICD-10-CM | POA: Diagnosis not present

## 2016-10-20 DIAGNOSIS — N10 Acute tubulo-interstitial nephritis: Secondary | ICD-10-CM | POA: Diagnosis not present

## 2016-10-20 DIAGNOSIS — E785 Hyperlipidemia, unspecified: Secondary | ICD-10-CM | POA: Diagnosis not present

## 2016-10-20 DIAGNOSIS — M25511 Pain in right shoulder: Secondary | ICD-10-CM | POA: Diagnosis not present

## 2016-10-20 DIAGNOSIS — S4991XA Unspecified injury of right shoulder and upper arm, initial encounter: Secondary | ICD-10-CM | POA: Diagnosis not present

## 2016-10-20 DIAGNOSIS — C8588 Other specified types of non-Hodgkin lymphoma, lymph nodes of multiple sites: Secondary | ICD-10-CM | POA: Diagnosis not present

## 2016-10-20 DIAGNOSIS — K7460 Unspecified cirrhosis of liver: Secondary | ICD-10-CM | POA: Diagnosis not present

## 2016-10-20 DIAGNOSIS — D696 Thrombocytopenia, unspecified: Secondary | ICD-10-CM | POA: Diagnosis not present

## 2016-10-21 DIAGNOSIS — B9629 Other Escherichia coli [E. coli] as the cause of diseases classified elsewhere: Secondary | ICD-10-CM | POA: Diagnosis not present

## 2016-10-21 DIAGNOSIS — K7581 Nonalcoholic steatohepatitis (NASH): Secondary | ICD-10-CM | POA: Diagnosis not present

## 2016-10-21 DIAGNOSIS — C8588 Other specified types of non-Hodgkin lymphoma, lymph nodes of multiple sites: Secondary | ICD-10-CM | POA: Diagnosis not present

## 2016-10-21 DIAGNOSIS — N10 Acute tubulo-interstitial nephritis: Secondary | ICD-10-CM | POA: Diagnosis not present

## 2016-10-23 DIAGNOSIS — C8588 Other specified types of non-Hodgkin lymphoma, lymph nodes of multiple sites: Secondary | ICD-10-CM | POA: Diagnosis not present

## 2016-10-23 DIAGNOSIS — K7581 Nonalcoholic steatohepatitis (NASH): Secondary | ICD-10-CM | POA: Diagnosis not present

## 2016-10-23 DIAGNOSIS — B9629 Other Escherichia coli [E. coli] as the cause of diseases classified elsewhere: Secondary | ICD-10-CM | POA: Diagnosis not present

## 2016-10-23 DIAGNOSIS — N10 Acute tubulo-interstitial nephritis: Secondary | ICD-10-CM | POA: Diagnosis not present

## 2016-10-27 DIAGNOSIS — N3080 Other cystitis without hematuria: Secondary | ICD-10-CM | POA: Diagnosis not present

## 2016-10-27 DIAGNOSIS — Z6830 Body mass index (BMI) 30.0-30.9, adult: Secondary | ICD-10-CM | POA: Diagnosis not present

## 2016-10-27 DIAGNOSIS — E1165 Type 2 diabetes mellitus with hyperglycemia: Secondary | ICD-10-CM | POA: Diagnosis not present

## 2016-11-06 DIAGNOSIS — R339 Retention of urine, unspecified: Secondary | ICD-10-CM | POA: Diagnosis not present

## 2016-11-06 DIAGNOSIS — N3001 Acute cystitis with hematuria: Secondary | ICD-10-CM | POA: Diagnosis not present

## 2016-11-06 DIAGNOSIS — Z6830 Body mass index (BMI) 30.0-30.9, adult: Secondary | ICD-10-CM | POA: Diagnosis not present

## 2016-11-20 DIAGNOSIS — K219 Gastro-esophageal reflux disease without esophagitis: Secondary | ICD-10-CM | POA: Diagnosis not present

## 2016-11-20 DIAGNOSIS — E1342 Other specified diabetes mellitus with diabetic polyneuropathy: Secondary | ICD-10-CM | POA: Diagnosis not present

## 2016-11-20 DIAGNOSIS — E784 Other hyperlipidemia: Secondary | ICD-10-CM | POA: Diagnosis not present

## 2016-11-20 DIAGNOSIS — K7469 Other cirrhosis of liver: Secondary | ICD-10-CM | POA: Diagnosis not present

## 2016-12-08 DIAGNOSIS — Z23 Encounter for immunization: Secondary | ICD-10-CM | POA: Diagnosis not present

## 2016-12-08 DIAGNOSIS — R339 Retention of urine, unspecified: Secondary | ICD-10-CM | POA: Diagnosis not present

## 2017-01-06 DIAGNOSIS — R339 Retention of urine, unspecified: Secondary | ICD-10-CM | POA: Diagnosis not present

## 2017-01-07 DIAGNOSIS — T85618D Breakdown (mechanical) of other specified internal prosthetic devices, implants and grafts, subsequent encounter: Secondary | ICD-10-CM | POA: Diagnosis not present

## 2017-01-09 DIAGNOSIS — Z882 Allergy status to sulfonamides status: Secondary | ICD-10-CM | POA: Diagnosis not present

## 2017-01-09 DIAGNOSIS — N289 Disorder of kidney and ureter, unspecified: Secondary | ICD-10-CM | POA: Diagnosis not present

## 2017-01-09 DIAGNOSIS — E1165 Type 2 diabetes mellitus with hyperglycemia: Secondary | ICD-10-CM | POA: Diagnosis not present

## 2017-01-09 DIAGNOSIS — Z79899 Other long term (current) drug therapy: Secondary | ICD-10-CM | POA: Diagnosis not present

## 2017-01-09 DIAGNOSIS — J45909 Unspecified asthma, uncomplicated: Secondary | ICD-10-CM | POA: Diagnosis not present

## 2017-01-09 DIAGNOSIS — Z90710 Acquired absence of both cervix and uterus: Secondary | ICD-10-CM | POA: Diagnosis not present

## 2017-01-09 DIAGNOSIS — Z794 Long term (current) use of insulin: Secondary | ICD-10-CM | POA: Diagnosis not present

## 2017-01-09 DIAGNOSIS — Z823 Family history of stroke: Secondary | ICD-10-CM | POA: Diagnosis not present

## 2017-01-09 DIAGNOSIS — I10 Essential (primary) hypertension: Secondary | ICD-10-CM | POA: Diagnosis not present

## 2017-01-09 DIAGNOSIS — Z91040 Latex allergy status: Secondary | ICD-10-CM | POA: Diagnosis not present

## 2017-01-09 DIAGNOSIS — T82598A Other mechanical complication of other cardiac and vascular devices and implants, initial encounter: Secondary | ICD-10-CM | POA: Diagnosis not present

## 2017-01-09 DIAGNOSIS — E119 Type 2 diabetes mellitus without complications: Secondary | ICD-10-CM | POA: Diagnosis not present

## 2017-01-09 DIAGNOSIS — K7581 Nonalcoholic steatohepatitis (NASH): Secondary | ICD-10-CM | POA: Diagnosis not present

## 2017-01-09 DIAGNOSIS — Z888 Allergy status to other drugs, medicaments and biological substances status: Secondary | ICD-10-CM | POA: Diagnosis not present

## 2017-01-09 DIAGNOSIS — C8590 Non-Hodgkin lymphoma, unspecified, unspecified site: Secondary | ICD-10-CM | POA: Diagnosis not present

## 2017-01-09 DIAGNOSIS — K7469 Other cirrhosis of liver: Secondary | ICD-10-CM | POA: Diagnosis not present

## 2017-01-09 DIAGNOSIS — Z8249 Family history of ischemic heart disease and other diseases of the circulatory system: Secondary | ICD-10-CM | POA: Diagnosis not present

## 2017-01-13 DIAGNOSIS — Z91040 Latex allergy status: Secondary | ICD-10-CM | POA: Diagnosis not present

## 2017-01-13 DIAGNOSIS — Z4682 Encounter for fitting and adjustment of non-vascular catheter: Secondary | ICD-10-CM | POA: Diagnosis not present

## 2017-01-13 DIAGNOSIS — K7469 Other cirrhosis of liver: Secondary | ICD-10-CM | POA: Diagnosis not present

## 2017-01-13 DIAGNOSIS — Z882 Allergy status to sulfonamides status: Secondary | ICD-10-CM | POA: Diagnosis not present

## 2017-01-13 DIAGNOSIS — T82598A Other mechanical complication of other cardiac and vascular devices and implants, initial encounter: Secondary | ICD-10-CM | POA: Diagnosis not present

## 2017-01-13 DIAGNOSIS — K7581 Nonalcoholic steatohepatitis (NASH): Secondary | ICD-10-CM | POA: Diagnosis not present

## 2017-01-13 DIAGNOSIS — Z888 Allergy status to other drugs, medicaments and biological substances status: Secondary | ICD-10-CM | POA: Diagnosis not present

## 2017-01-13 DIAGNOSIS — I10 Essential (primary) hypertension: Secondary | ICD-10-CM | POA: Diagnosis not present

## 2017-01-13 DIAGNOSIS — C8590 Non-Hodgkin lymphoma, unspecified, unspecified site: Secondary | ICD-10-CM | POA: Diagnosis not present

## 2017-01-13 DIAGNOSIS — E119 Type 2 diabetes mellitus without complications: Secondary | ICD-10-CM | POA: Diagnosis not present

## 2017-01-13 DIAGNOSIS — Z794 Long term (current) use of insulin: Secondary | ICD-10-CM | POA: Diagnosis not present

## 2017-01-13 DIAGNOSIS — J45909 Unspecified asthma, uncomplicated: Secondary | ICD-10-CM | POA: Diagnosis not present

## 2017-01-13 DIAGNOSIS — Z823 Family history of stroke: Secondary | ICD-10-CM | POA: Diagnosis not present

## 2017-01-13 DIAGNOSIS — Z8249 Family history of ischemic heart disease and other diseases of the circulatory system: Secondary | ICD-10-CM | POA: Diagnosis not present

## 2017-01-13 DIAGNOSIS — Z79899 Other long term (current) drug therapy: Secondary | ICD-10-CM | POA: Diagnosis not present

## 2017-01-13 DIAGNOSIS — Z90710 Acquired absence of both cervix and uterus: Secondary | ICD-10-CM | POA: Diagnosis not present

## 2017-01-13 DIAGNOSIS — N289 Disorder of kidney and ureter, unspecified: Secondary | ICD-10-CM | POA: Diagnosis not present

## 2017-01-14 DIAGNOSIS — T82598A Other mechanical complication of other cardiac and vascular devices and implants, initial encounter: Secondary | ICD-10-CM | POA: Diagnosis not present

## 2017-01-14 DIAGNOSIS — C8590 Non-Hodgkin lymphoma, unspecified, unspecified site: Secondary | ICD-10-CM | POA: Diagnosis not present

## 2017-02-06 DIAGNOSIS — R339 Retention of urine, unspecified: Secondary | ICD-10-CM | POA: Diagnosis not present

## 2017-02-20 DIAGNOSIS — Z6830 Body mass index (BMI) 30.0-30.9, adult: Secondary | ICD-10-CM | POA: Diagnosis not present

## 2017-02-20 DIAGNOSIS — R5383 Other fatigue: Secondary | ICD-10-CM | POA: Diagnosis not present

## 2017-02-20 DIAGNOSIS — I10 Essential (primary) hypertension: Secondary | ICD-10-CM | POA: Diagnosis not present

## 2017-02-20 DIAGNOSIS — E7849 Other hyperlipidemia: Secondary | ICD-10-CM | POA: Diagnosis not present

## 2017-02-20 DIAGNOSIS — E1342 Other specified diabetes mellitus with diabetic polyneuropathy: Secondary | ICD-10-CM | POA: Diagnosis not present

## 2017-02-20 DIAGNOSIS — K7469 Other cirrhosis of liver: Secondary | ICD-10-CM | POA: Diagnosis not present

## 2017-02-23 DIAGNOSIS — L430 Hypertrophic lichen planus: Secondary | ICD-10-CM | POA: Diagnosis not present

## 2017-02-23 DIAGNOSIS — L570 Actinic keratosis: Secondary | ICD-10-CM | POA: Diagnosis not present

## 2017-02-23 DIAGNOSIS — D485 Neoplasm of uncertain behavior of skin: Secondary | ICD-10-CM | POA: Diagnosis not present

## 2017-02-23 DIAGNOSIS — Z85828 Personal history of other malignant neoplasm of skin: Secondary | ICD-10-CM | POA: Diagnosis not present

## 2017-03-09 DIAGNOSIS — R339 Retention of urine, unspecified: Secondary | ICD-10-CM | POA: Diagnosis not present

## 2017-03-17 DIAGNOSIS — Z6831 Body mass index (BMI) 31.0-31.9, adult: Secondary | ICD-10-CM | POA: Diagnosis not present

## 2017-03-17 DIAGNOSIS — N3080 Other cystitis without hematuria: Secondary | ICD-10-CM | POA: Diagnosis not present

## 2017-03-22 DIAGNOSIS — E1165 Type 2 diabetes mellitus with hyperglycemia: Secondary | ICD-10-CM | POA: Diagnosis not present

## 2017-04-08 DIAGNOSIS — R339 Retention of urine, unspecified: Secondary | ICD-10-CM | POA: Diagnosis not present

## 2017-04-09 DIAGNOSIS — N3021 Other chronic cystitis with hematuria: Secondary | ICD-10-CM | POA: Diagnosis not present

## 2017-05-07 DIAGNOSIS — R339 Retention of urine, unspecified: Secondary | ICD-10-CM | POA: Diagnosis not present

## 2017-05-26 DIAGNOSIS — K210 Gastro-esophageal reflux disease with esophagitis: Secondary | ICD-10-CM | POA: Diagnosis not present

## 2017-05-26 DIAGNOSIS — Z6831 Body mass index (BMI) 31.0-31.9, adult: Secondary | ICD-10-CM | POA: Diagnosis not present

## 2017-05-26 DIAGNOSIS — I5032 Chronic diastolic (congestive) heart failure: Secondary | ICD-10-CM | POA: Diagnosis not present

## 2017-05-26 DIAGNOSIS — E7849 Other hyperlipidemia: Secondary | ICD-10-CM | POA: Diagnosis not present

## 2017-05-26 DIAGNOSIS — K7469 Other cirrhosis of liver: Secondary | ICD-10-CM | POA: Diagnosis not present

## 2017-05-26 DIAGNOSIS — N3080 Other cystitis without hematuria: Secondary | ICD-10-CM | POA: Diagnosis not present

## 2017-05-26 DIAGNOSIS — E119 Type 2 diabetes mellitus without complications: Secondary | ICD-10-CM | POA: Diagnosis not present

## 2017-05-27 ENCOUNTER — Ambulatory Visit: Payer: Medicare Other | Admitting: Urology

## 2017-05-27 ENCOUNTER — Other Ambulatory Visit (HOSPITAL_COMMUNITY)
Admission: RE | Admit: 2017-05-27 | Discharge: 2017-05-27 | Disposition: A | Discharge status: Home / Self Care | Payer: Medicare Other | Source: Other Acute Inpatient Hospital | Attending: Urology | Admitting: Urology

## 2017-05-27 DIAGNOSIS — N3021 Other chronic cystitis with hematuria: Secondary | ICD-10-CM

## 2017-05-29 LAB — URINE CULTURE: Culture: >=100000 — AB

## 2017-06-04 DIAGNOSIS — R339 Retention of urine, unspecified: Secondary | ICD-10-CM | POA: Diagnosis not present

## 2017-06-24 ENCOUNTER — Ambulatory Visit: Payer: Medicare Other | Admitting: Urology

## 2017-06-24 DIAGNOSIS — N3021 Other chronic cystitis with hematuria: Secondary | ICD-10-CM | POA: Diagnosis not present

## 2017-07-01 DIAGNOSIS — Z1389 Encounter for screening for other disorder: Secondary | ICD-10-CM | POA: Diagnosis not present

## 2017-07-01 DIAGNOSIS — M5431 Sciatica, right side: Secondary | ICD-10-CM | POA: Diagnosis not present

## 2017-07-01 DIAGNOSIS — M545 Low back pain: Secondary | ICD-10-CM | POA: Diagnosis not present

## 2017-07-01 DIAGNOSIS — Z6830 Body mass index (BMI) 30.0-30.9, adult: Secondary | ICD-10-CM | POA: Diagnosis not present

## 2017-07-01 DIAGNOSIS — K7469 Other cirrhosis of liver: Secondary | ICD-10-CM | POA: Diagnosis not present

## 2017-07-01 DIAGNOSIS — Z0000 Encounter for general adult medical examination without abnormal findings: Secondary | ICD-10-CM | POA: Diagnosis not present

## 2017-07-01 DIAGNOSIS — E7849 Other hyperlipidemia: Secondary | ICD-10-CM | POA: Diagnosis not present

## 2017-07-07 DIAGNOSIS — R339 Retention of urine, unspecified: Secondary | ICD-10-CM | POA: Diagnosis not present

## 2017-07-14 DIAGNOSIS — H2513 Age-related nuclear cataract, bilateral: Secondary | ICD-10-CM | POA: Diagnosis not present

## 2017-07-14 DIAGNOSIS — H40033 Anatomical narrow angle, bilateral: Secondary | ICD-10-CM | POA: Diagnosis not present

## 2017-07-20 DIAGNOSIS — E1165 Type 2 diabetes mellitus with hyperglycemia: Secondary | ICD-10-CM | POA: Diagnosis not present

## 2017-07-27 DIAGNOSIS — M85832 Other specified disorders of bone density and structure, left forearm: Secondary | ICD-10-CM | POA: Diagnosis not present

## 2017-07-28 DIAGNOSIS — Z1231 Encounter for screening mammogram for malignant neoplasm of breast: Secondary | ICD-10-CM | POA: Diagnosis not present

## 2017-08-01 DIAGNOSIS — H04123 Dry eye syndrome of bilateral lacrimal glands: Secondary | ICD-10-CM | POA: Diagnosis not present

## 2017-08-06 DIAGNOSIS — R339 Retention of urine, unspecified: Secondary | ICD-10-CM | POA: Diagnosis not present

## 2017-08-12 ENCOUNTER — Other Ambulatory Visit (HOSPITAL_COMMUNITY)
Admission: AD | Admit: 2017-08-12 | Discharge: 2017-08-12 | Disposition: A | Discharge status: Home / Self Care | Payer: Medicare Other | Source: Skilled Nursing Facility | Attending: Urology | Admitting: Urology

## 2017-08-12 DIAGNOSIS — N3021 Other chronic cystitis with hematuria: Secondary | ICD-10-CM | POA: Diagnosis present

## 2017-08-14 LAB — URINE CULTURE: Culture: >=100000 — AB

## 2017-08-20 DIAGNOSIS — H35361 Drusen (degenerative) of macula, right eye: Secondary | ICD-10-CM | POA: Diagnosis not present

## 2017-08-20 DIAGNOSIS — H43812 Vitreous degeneration, left eye: Secondary | ICD-10-CM | POA: Diagnosis not present

## 2017-08-20 DIAGNOSIS — E119 Type 2 diabetes mellitus without complications: Secondary | ICD-10-CM | POA: Diagnosis not present

## 2017-08-20 DIAGNOSIS — Z961 Presence of intraocular lens: Secondary | ICD-10-CM | POA: Diagnosis not present

## 2017-08-25 DIAGNOSIS — B079 Viral wart, unspecified: Secondary | ICD-10-CM | POA: Diagnosis not present

## 2017-08-25 DIAGNOSIS — D485 Neoplasm of uncertain behavior of skin: Secondary | ICD-10-CM | POA: Diagnosis not present

## 2017-08-25 DIAGNOSIS — Z85828 Personal history of other malignant neoplasm of skin: Secondary | ICD-10-CM | POA: Diagnosis not present

## 2017-08-25 DIAGNOSIS — L570 Actinic keratosis: Secondary | ICD-10-CM | POA: Diagnosis not present

## 2017-09-02 DIAGNOSIS — E7849 Other hyperlipidemia: Secondary | ICD-10-CM | POA: Diagnosis not present

## 2017-09-02 DIAGNOSIS — I10 Essential (primary) hypertension: Secondary | ICD-10-CM | POA: Diagnosis not present

## 2017-09-02 DIAGNOSIS — E119 Type 2 diabetes mellitus without complications: Secondary | ICD-10-CM | POA: Diagnosis not present

## 2017-09-02 DIAGNOSIS — K210 Gastro-esophageal reflux disease with esophagitis: Secondary | ICD-10-CM | POA: Diagnosis not present

## 2017-09-23 ENCOUNTER — Ambulatory Visit: Payer: Medicare Other | Admitting: Urology

## 2017-09-23 DIAGNOSIS — N3021 Other chronic cystitis with hematuria: Secondary | ICD-10-CM

## 2017-10-03 DIAGNOSIS — E1165 Type 2 diabetes mellitus with hyperglycemia: Secondary | ICD-10-CM | POA: Diagnosis not present

## 2017-10-05 DIAGNOSIS — R339 Retention of urine, unspecified: Secondary | ICD-10-CM | POA: Diagnosis not present

## 2017-10-12 DIAGNOSIS — J40 Bronchitis, not specified as acute or chronic: Secondary | ICD-10-CM | POA: Diagnosis not present

## 2017-10-12 DIAGNOSIS — Z6830 Body mass index (BMI) 30.0-30.9, adult: Secondary | ICD-10-CM | POA: Diagnosis not present

## 2017-11-03 DIAGNOSIS — R339 Retention of urine, unspecified: Secondary | ICD-10-CM | POA: Diagnosis not present

## 2017-11-12 DIAGNOSIS — Z452 Encounter for adjustment and management of vascular access device: Secondary | ICD-10-CM | POA: Diagnosis not present

## 2017-11-16 DIAGNOSIS — K121 Other forms of stomatitis: Secondary | ICD-10-CM | POA: Diagnosis not present

## 2017-11-16 DIAGNOSIS — J40 Bronchitis, not specified as acute or chronic: Secondary | ICD-10-CM | POA: Diagnosis not present

## 2017-11-16 DIAGNOSIS — R5383 Other fatigue: Secondary | ICD-10-CM | POA: Diagnosis not present

## 2017-11-16 DIAGNOSIS — Z6830 Body mass index (BMI) 30.0-30.9, adult: Secondary | ICD-10-CM | POA: Diagnosis not present

## 2017-11-18 DIAGNOSIS — D649 Anemia, unspecified: Secondary | ICD-10-CM | POA: Diagnosis not present

## 2017-12-01 DIAGNOSIS — R339 Retention of urine, unspecified: Secondary | ICD-10-CM | POA: Diagnosis not present

## 2017-12-14 DIAGNOSIS — E1165 Type 2 diabetes mellitus with hyperglycemia: Secondary | ICD-10-CM | POA: Diagnosis not present

## 2017-12-14 DIAGNOSIS — Z23 Encounter for immunization: Secondary | ICD-10-CM | POA: Diagnosis not present

## 2017-12-15 DIAGNOSIS — K7689 Other specified diseases of liver: Secondary | ICD-10-CM | POA: Diagnosis not present

## 2017-12-15 DIAGNOSIS — K121 Other forms of stomatitis: Secondary | ICD-10-CM | POA: Diagnosis not present

## 2017-12-15 DIAGNOSIS — Z0000 Encounter for general adult medical examination without abnormal findings: Secondary | ICD-10-CM | POA: Diagnosis not present

## 2017-12-15 DIAGNOSIS — Z6830 Body mass index (BMI) 30.0-30.9, adult: Secondary | ICD-10-CM | POA: Diagnosis not present

## 2017-12-15 DIAGNOSIS — R5383 Other fatigue: Secondary | ICD-10-CM | POA: Diagnosis not present

## 2017-12-24 DIAGNOSIS — Z452 Encounter for adjustment and management of vascular access device: Secondary | ICD-10-CM | POA: Diagnosis not present

## 2017-12-30 ENCOUNTER — Ambulatory Visit: Payer: Medicare Other | Admitting: Urology

## 2017-12-30 ENCOUNTER — Other Ambulatory Visit (HOSPITAL_COMMUNITY)
Admission: RE | Admit: 2017-12-30 | Discharge: 2017-12-30 | Disposition: A | Discharge status: Home / Self Care | Payer: Medicare Other | Source: Ambulatory Visit | Attending: Urology | Admitting: Urology

## 2017-12-30 DIAGNOSIS — N3021 Other chronic cystitis with hematuria: Secondary | ICD-10-CM | POA: Insufficient documentation

## 2018-01-01 LAB — URINE CULTURE

## 2018-01-05 DIAGNOSIS — R339 Retention of urine, unspecified: Secondary | ICD-10-CM | POA: Diagnosis not present

## 2018-01-28 DIAGNOSIS — R339 Retention of urine, unspecified: Secondary | ICD-10-CM | POA: Diagnosis not present

## 2018-02-02 DIAGNOSIS — Z452 Encounter for adjustment and management of vascular access device: Secondary | ICD-10-CM | POA: Diagnosis not present

## 2018-02-15 DIAGNOSIS — Z6830 Body mass index (BMI) 30.0-30.9, adult: Secondary | ICD-10-CM | POA: Diagnosis not present

## 2018-02-15 DIAGNOSIS — M5441 Lumbago with sciatica, right side: Secondary | ICD-10-CM | POA: Diagnosis not present

## 2018-02-15 DIAGNOSIS — M545 Low back pain: Secondary | ICD-10-CM | POA: Diagnosis not present

## 2018-02-22 DIAGNOSIS — Z8572 Personal history of non-Hodgkin lymphomas: Secondary | ICD-10-CM | POA: Diagnosis not present

## 2018-02-22 DIAGNOSIS — Z91048 Other nonmedicinal substance allergy status: Secondary | ICD-10-CM | POA: Diagnosis not present

## 2018-02-22 DIAGNOSIS — I10 Essential (primary) hypertension: Secondary | ICD-10-CM | POA: Diagnosis not present

## 2018-02-22 DIAGNOSIS — K7581 Nonalcoholic steatohepatitis (NASH): Secondary | ICD-10-CM | POA: Diagnosis not present

## 2018-02-22 DIAGNOSIS — Z79899 Other long term (current) drug therapy: Secondary | ICD-10-CM | POA: Diagnosis not present

## 2018-02-22 DIAGNOSIS — Z794 Long term (current) use of insulin: Secondary | ICD-10-CM | POA: Diagnosis not present

## 2018-02-22 DIAGNOSIS — Z882 Allergy status to sulfonamides status: Secondary | ICD-10-CM | POA: Diagnosis not present

## 2018-02-22 DIAGNOSIS — Z881 Allergy status to other antibiotic agents status: Secondary | ICD-10-CM | POA: Diagnosis not present

## 2018-02-22 DIAGNOSIS — R339 Retention of urine, unspecified: Secondary | ICD-10-CM | POA: Diagnosis not present

## 2018-02-22 DIAGNOSIS — G459 Transient cerebral ischemic attack, unspecified: Secondary | ICD-10-CM | POA: Diagnosis not present

## 2018-02-22 DIAGNOSIS — E119 Type 2 diabetes mellitus without complications: Secondary | ICD-10-CM | POA: Diagnosis not present

## 2018-02-22 DIAGNOSIS — E785 Hyperlipidemia, unspecified: Secondary | ICD-10-CM | POA: Diagnosis not present

## 2018-02-22 DIAGNOSIS — N390 Urinary tract infection, site not specified: Secondary | ICD-10-CM | POA: Diagnosis not present

## 2018-02-22 DIAGNOSIS — Z9049 Acquired absence of other specified parts of digestive tract: Secondary | ICD-10-CM | POA: Diagnosis not present

## 2018-02-22 DIAGNOSIS — R55 Syncope and collapse: Secondary | ICD-10-CM | POA: Diagnosis not present

## 2018-02-22 DIAGNOSIS — J449 Chronic obstructive pulmonary disease, unspecified: Secondary | ICD-10-CM | POA: Diagnosis not present

## 2018-02-22 DIAGNOSIS — K7460 Unspecified cirrhosis of liver: Secondary | ICD-10-CM | POA: Diagnosis not present

## 2018-02-22 DIAGNOSIS — E871 Hypo-osmolality and hyponatremia: Secondary | ICD-10-CM | POA: Diagnosis not present

## 2018-03-04 DIAGNOSIS — N3091 Cystitis, unspecified with hematuria: Secondary | ICD-10-CM | POA: Diagnosis not present

## 2018-03-04 DIAGNOSIS — Z6831 Body mass index (BMI) 31.0-31.9, adult: Secondary | ICD-10-CM | POA: Diagnosis not present

## 2018-03-04 DIAGNOSIS — E1165 Type 2 diabetes mellitus with hyperglycemia: Secondary | ICD-10-CM | POA: Diagnosis not present

## 2018-03-05 DIAGNOSIS — N3091 Cystitis, unspecified with hematuria: Secondary | ICD-10-CM | POA: Diagnosis not present

## 2018-03-16 DIAGNOSIS — Z452 Encounter for adjustment and management of vascular access device: Secondary | ICD-10-CM | POA: Diagnosis not present

## 2018-03-23 DIAGNOSIS — K7469 Other cirrhosis of liver: Secondary | ICD-10-CM | POA: Diagnosis not present

## 2018-03-23 DIAGNOSIS — E782 Mixed hyperlipidemia: Secondary | ICD-10-CM | POA: Diagnosis not present

## 2018-03-23 DIAGNOSIS — R339 Retention of urine, unspecified: Secondary | ICD-10-CM | POA: Diagnosis not present

## 2018-03-23 DIAGNOSIS — E119 Type 2 diabetes mellitus without complications: Secondary | ICD-10-CM | POA: Diagnosis not present

## 2018-03-23 DIAGNOSIS — K210 Gastro-esophageal reflux disease with esophagitis: Secondary | ICD-10-CM | POA: Diagnosis not present

## 2018-03-26 DIAGNOSIS — J189 Pneumonia, unspecified organism: Secondary | ICD-10-CM | POA: Diagnosis not present

## 2018-03-26 DIAGNOSIS — R197 Diarrhea, unspecified: Secondary | ICD-10-CM | POA: Diagnosis not present

## 2018-03-26 DIAGNOSIS — K7460 Unspecified cirrhosis of liver: Secondary | ICD-10-CM | POA: Diagnosis not present

## 2018-03-26 DIAGNOSIS — R918 Other nonspecific abnormal finding of lung field: Secondary | ICD-10-CM | POA: Diagnosis not present

## 2018-03-26 DIAGNOSIS — I509 Heart failure, unspecified: Secondary | ICD-10-CM | POA: Diagnosis not present

## 2018-03-26 DIAGNOSIS — Z881 Allergy status to other antibiotic agents status: Secondary | ICD-10-CM | POA: Diagnosis not present

## 2018-03-26 DIAGNOSIS — E785 Hyperlipidemia, unspecified: Secondary | ICD-10-CM | POA: Diagnosis not present

## 2018-03-26 DIAGNOSIS — Z882 Allergy status to sulfonamides status: Secondary | ICD-10-CM | POA: Diagnosis not present

## 2018-03-26 DIAGNOSIS — E119 Type 2 diabetes mellitus without complications: Secondary | ICD-10-CM | POA: Diagnosis not present

## 2018-03-26 DIAGNOSIS — Z8572 Personal history of non-Hodgkin lymphomas: Secondary | ICD-10-CM | POA: Diagnosis not present

## 2018-03-26 DIAGNOSIS — Z79899 Other long term (current) drug therapy: Secondary | ICD-10-CM | POA: Diagnosis not present

## 2018-03-26 DIAGNOSIS — I110 Hypertensive heart disease with heart failure: Secondary | ICD-10-CM | POA: Diagnosis not present

## 2018-03-26 DIAGNOSIS — T83518A Infection and inflammatory reaction due to other urinary catheter, initial encounter: Secondary | ICD-10-CM | POA: Diagnosis not present

## 2018-03-26 DIAGNOSIS — J449 Chronic obstructive pulmonary disease, unspecified: Secondary | ICD-10-CM | POA: Diagnosis not present

## 2018-03-26 DIAGNOSIS — Z794 Long term (current) use of insulin: Secondary | ICD-10-CM | POA: Diagnosis not present

## 2018-03-26 DIAGNOSIS — Z87440 Personal history of urinary (tract) infections: Secondary | ICD-10-CM | POA: Diagnosis not present

## 2018-03-26 DIAGNOSIS — R51 Headache: Secondary | ICD-10-CM | POA: Diagnosis not present

## 2018-03-26 DIAGNOSIS — N390 Urinary tract infection, site not specified: Secondary | ICD-10-CM | POA: Diagnosis not present

## 2018-03-26 DIAGNOSIS — A0472 Enterocolitis due to Clostridium difficile, not specified as recurrent: Secondary | ICD-10-CM | POA: Diagnosis not present

## 2018-03-26 DIAGNOSIS — R1111 Vomiting without nausea: Secondary | ICD-10-CM | POA: Diagnosis not present

## 2018-03-26 DIAGNOSIS — R609 Edema, unspecified: Secondary | ICD-10-CM | POA: Diagnosis not present

## 2018-03-26 DIAGNOSIS — J440 Chronic obstructive pulmonary disease with acute lower respiratory infection: Secondary | ICD-10-CM | POA: Diagnosis not present

## 2018-03-31 DIAGNOSIS — J181 Lobar pneumonia, unspecified organism: Secondary | ICD-10-CM | POA: Diagnosis not present

## 2018-03-31 DIAGNOSIS — N12 Tubulo-interstitial nephritis, not specified as acute or chronic: Secondary | ICD-10-CM | POA: Diagnosis not present

## 2018-03-31 DIAGNOSIS — E119 Type 2 diabetes mellitus without complications: Secondary | ICD-10-CM | POA: Diagnosis not present

## 2018-03-31 DIAGNOSIS — Z9981 Dependence on supplemental oxygen: Secondary | ICD-10-CM | POA: Diagnosis not present

## 2018-03-31 DIAGNOSIS — Z794 Long term (current) use of insulin: Secondary | ICD-10-CM | POA: Diagnosis not present

## 2018-03-31 DIAGNOSIS — Z87440 Personal history of urinary (tract) infections: Secondary | ICD-10-CM | POA: Diagnosis not present

## 2018-03-31 DIAGNOSIS — I10 Essential (primary) hypertension: Secondary | ICD-10-CM | POA: Diagnosis not present

## 2018-03-31 DIAGNOSIS — J440 Chronic obstructive pulmonary disease with acute lower respiratory infection: Secondary | ICD-10-CM | POA: Diagnosis not present

## 2018-04-06 DIAGNOSIS — B967 Clostridium perfringens [C. perfringens] as the cause of diseases classified elsewhere: Secondary | ICD-10-CM | POA: Diagnosis not present

## 2018-04-06 DIAGNOSIS — Z6829 Body mass index (BMI) 29.0-29.9, adult: Secondary | ICD-10-CM | POA: Diagnosis not present

## 2018-04-06 DIAGNOSIS — J189 Pneumonia, unspecified organism: Secondary | ICD-10-CM | POA: Diagnosis not present

## 2018-04-07 ENCOUNTER — Ambulatory Visit: Payer: Medicare Other | Admitting: Urology

## 2018-04-21 DIAGNOSIS — E1140 Type 2 diabetes mellitus with diabetic neuropathy, unspecified: Secondary | ICD-10-CM | POA: Diagnosis not present

## 2018-04-21 DIAGNOSIS — M79671 Pain in right foot: Secondary | ICD-10-CM | POA: Diagnosis not present

## 2018-04-21 DIAGNOSIS — R339 Retention of urine, unspecified: Secondary | ICD-10-CM | POA: Diagnosis not present

## 2018-04-21 DIAGNOSIS — L110 Acquired keratosis follicularis: Secondary | ICD-10-CM | POA: Diagnosis not present

## 2018-04-21 DIAGNOSIS — M79672 Pain in left foot: Secondary | ICD-10-CM | POA: Diagnosis not present

## 2018-04-27 DIAGNOSIS — Z452 Encounter for adjustment and management of vascular access device: Secondary | ICD-10-CM | POA: Diagnosis not present

## 2018-04-30 DIAGNOSIS — N12 Tubulo-interstitial nephritis, not specified as acute or chronic: Secondary | ICD-10-CM | POA: Diagnosis not present

## 2018-04-30 DIAGNOSIS — E119 Type 2 diabetes mellitus without complications: Secondary | ICD-10-CM | POA: Diagnosis not present

## 2018-04-30 DIAGNOSIS — Z794 Long term (current) use of insulin: Secondary | ICD-10-CM | POA: Diagnosis not present

## 2018-04-30 DIAGNOSIS — I10 Essential (primary) hypertension: Secondary | ICD-10-CM | POA: Diagnosis not present

## 2018-04-30 DIAGNOSIS — J440 Chronic obstructive pulmonary disease with acute lower respiratory infection: Secondary | ICD-10-CM | POA: Diagnosis not present

## 2018-04-30 DIAGNOSIS — Z9981 Dependence on supplemental oxygen: Secondary | ICD-10-CM | POA: Diagnosis not present

## 2018-04-30 DIAGNOSIS — Z87440 Personal history of urinary (tract) infections: Secondary | ICD-10-CM | POA: Diagnosis not present

## 2018-04-30 DIAGNOSIS — J181 Lobar pneumonia, unspecified organism: Secondary | ICD-10-CM | POA: Diagnosis not present

## 2018-05-03 DIAGNOSIS — N390 Urinary tract infection, site not specified: Secondary | ICD-10-CM | POA: Diagnosis not present

## 2018-05-19 DIAGNOSIS — R339 Retention of urine, unspecified: Secondary | ICD-10-CM | POA: Diagnosis not present

## 2018-06-08 DIAGNOSIS — Z452 Encounter for adjustment and management of vascular access device: Secondary | ICD-10-CM | POA: Diagnosis not present

## 2018-06-17 DIAGNOSIS — R339 Retention of urine, unspecified: Secondary | ICD-10-CM | POA: Diagnosis not present

## 2018-06-22 DIAGNOSIS — E119 Type 2 diabetes mellitus without complications: Secondary | ICD-10-CM | POA: Diagnosis not present

## 2018-06-22 DIAGNOSIS — K210 Gastro-esophageal reflux disease with esophagitis: Secondary | ICD-10-CM | POA: Diagnosis not present

## 2018-06-22 DIAGNOSIS — I10 Essential (primary) hypertension: Secondary | ICD-10-CM | POA: Diagnosis not present

## 2018-06-22 DIAGNOSIS — E7849 Other hyperlipidemia: Secondary | ICD-10-CM | POA: Diagnosis not present

## 2018-07-14 DIAGNOSIS — M79672 Pain in left foot: Secondary | ICD-10-CM | POA: Diagnosis not present

## 2018-07-14 DIAGNOSIS — R339 Retention of urine, unspecified: Secondary | ICD-10-CM | POA: Diagnosis not present

## 2018-07-14 DIAGNOSIS — E1140 Type 2 diabetes mellitus with diabetic neuropathy, unspecified: Secondary | ICD-10-CM | POA: Diagnosis not present

## 2018-07-14 DIAGNOSIS — L110 Acquired keratosis follicularis: Secondary | ICD-10-CM | POA: Diagnosis not present

## 2018-07-14 DIAGNOSIS — M79671 Pain in right foot: Secondary | ICD-10-CM | POA: Diagnosis not present

## 2018-07-20 DIAGNOSIS — Z452 Encounter for adjustment and management of vascular access device: Secondary | ICD-10-CM | POA: Diagnosis not present

## 2018-08-11 DIAGNOSIS — R339 Retention of urine, unspecified: Secondary | ICD-10-CM | POA: Diagnosis not present

## 2018-08-25 DIAGNOSIS — H43812 Vitreous degeneration, left eye: Secondary | ICD-10-CM | POA: Diagnosis not present

## 2018-08-31 DIAGNOSIS — Z452 Encounter for adjustment and management of vascular access device: Secondary | ICD-10-CM | POA: Diagnosis not present

## 2018-09-09 DIAGNOSIS — R339 Retention of urine, unspecified: Secondary | ICD-10-CM | POA: Diagnosis not present

## 2018-09-21 DIAGNOSIS — I10 Essential (primary) hypertension: Secondary | ICD-10-CM | POA: Diagnosis not present

## 2018-09-21 DIAGNOSIS — M25551 Pain in right hip: Secondary | ICD-10-CM | POA: Diagnosis not present

## 2018-09-21 DIAGNOSIS — K210 Gastro-esophageal reflux disease with esophagitis: Secondary | ICD-10-CM | POA: Diagnosis not present

## 2018-09-21 DIAGNOSIS — M1611 Unilateral primary osteoarthritis, right hip: Secondary | ICD-10-CM | POA: Diagnosis not present

## 2018-09-21 DIAGNOSIS — E119 Type 2 diabetes mellitus without complications: Secondary | ICD-10-CM | POA: Diagnosis not present

## 2018-09-21 DIAGNOSIS — E7849 Other hyperlipidemia: Secondary | ICD-10-CM | POA: Diagnosis not present

## 2018-09-29 DIAGNOSIS — M79672 Pain in left foot: Secondary | ICD-10-CM | POA: Diagnosis not present

## 2018-09-29 DIAGNOSIS — M79671 Pain in right foot: Secondary | ICD-10-CM | POA: Diagnosis not present

## 2018-09-29 DIAGNOSIS — L110 Acquired keratosis follicularis: Secondary | ICD-10-CM | POA: Diagnosis not present

## 2018-09-29 DIAGNOSIS — E1140 Type 2 diabetes mellitus with diabetic neuropathy, unspecified: Secondary | ICD-10-CM | POA: Diagnosis not present

## 2018-09-30 ENCOUNTER — Encounter: Payer: Self-pay | Admitting: Orthopaedic Surgery

## 2018-09-30 ENCOUNTER — Other Ambulatory Visit: Payer: Self-pay

## 2018-09-30 ENCOUNTER — Ambulatory Visit (INDEPENDENT_AMBULATORY_CARE_PROVIDER_SITE_OTHER): Payer: Medicare Other | Admitting: Orthopaedic Surgery

## 2018-09-30 DIAGNOSIS — M1611 Unilateral primary osteoarthritis, right hip: Secondary | ICD-10-CM

## 2018-09-30 NOTE — Progress Notes (Signed)
Office Visit Note   Patient: Brooke Gutierrez           Date of Birth: [birth year]           MRN: [id number] Visit Date: 09/30/2018              Requested by: Brooke Herb, MD [address] [place],  [place] [address] PCP: Brooke Herb, MD   Assessment & Plan: Visit Diagnoses:  1. Unilateral primary osteoarthritis, right hip     Plan: Patient has severe right hip osteoarthritis and may have some possible avascular necrosis.  We discussed total hip arthroplasty with a direct anterior approach with lower dislocation rate and short of postoperative course to resume normal ambulation.  She would need preoperative clearance with Dr.Hasanaj and it appears last A1c was 7.0.  She can return in 4 weeks to discuss this further.  X-ray results were reviewed with patient.  Indications for surgery was discussed.  Thank you for the opportunity to share in her care.  Follow-Up Instructions: Return in about 4 weeks (around 10/28/2018).   Orders:  No orders of the defined types were placed in this encounter.  No orders of the defined types were placed in this encounter.     Procedures: No procedures performed   Clinical Data: No additional findings.   Subjective: Chief Complaint  Patient presents with  . Right Leg - Pain    HPI 83 year old female seen with right groin pain and hip pain which she rates at 8 out of 10.  She has had some chronic back pain in the past with MRI scan 2015 showing moderate spinal stenosis at L4-5 and left side disc protrusion L5-S1 on the opposite side from her groin pain.  Patient is used Tylenol with slight improvement.  She is ambulated with Trendelenburg gait.  Pain is progressed over the last year.  Patient is a diabetic on insulin states Lantus 10 units at bedtime and sliding scale with meals.  She also is on metformin.  Patient referred by [facility] for hip x-rays done at [facility] on 09/22/2018 that showed severe right hip degenerative changes  possible avascular necrosis.  Negative for fractures or dislocations.  Review of Systems 14 point view of systems positive for peptic ulcer disease, hypertension history of lymphoma.  Hypercholesterolemia, GERD, IBS.  Nonalcoholic cirrhosis.  Urethral stricture diverticulosis she is on a self-catheterization program.  Positive for bronchitis asthma.  Previous appendectomy, C-section, bladder tack up procedure, umbilical hernia fixed with mesh.  Sinus repair and hysterectomy.  No anesthetic problems.  Patient is a non-smoker.  Patient lives with her husband Brooke Gutierrez.   Objective: Vital Signs: BP 130/65   Pulse 74   Ht 5\' 2"  (1.575 m)   Wt 162 lb (73.5 kg)   BMI 29.63 kg/m   Physical Exam Constitutional:      Appearance: She is well-developed.  HENT:     Head: Normocephalic.     Right Ear: External ear normal.     Left Ear: External ear normal.  Eyes:     Pupils: Pupils are equal, round, and reactive to light.  Neck:     Thyroid: No thyromegaly.     Trachea: No tracheal deviation.  Cardiovascular:     Rate and Rhythm: Normal rate.  Pulmonary:     Effort: Pulmonary effort is normal.  Abdominal:     Palpations: Abdomen is soft.  Skin:    General: Skin is warm and dry.  Neurological:     Mental Status: She is alert and oriented to person, place, and time.  Psychiatric:        Behavior: Behavior normal.     Ortho Exam patient has mild sciatic notch tenderness right and left.  Negative straight leg raising.  She has 20 degrees internal rotation right hip with severe groin pain that radiates down to the distal thigh.  Minimal knee crepitus both knees reach full extension distal pulses are 2+ and symmetrical. Specialty Comments:  No specialty comments available.  Imaging: No results found.   PMFS History: Patient Active Problem List   Diagnosis Date Noted  . Unilateral primary osteoarthritis, right hip 10/02/2018   History reviewed. No pertinent past medical history.   History reviewed. No pertinent family history.  History reviewed. No pertinent surgical history. Social History   Occupational History  . Not on file  Tobacco Use  . Smoking status: Never Smoker  . Smokeless tobacco: Never Used  Substance and Sexual Activity  . Alcohol use: Not on file  . Drug use: Not on file  . Sexual activity: Not on file

## 2018-10-02 ENCOUNTER — Encounter: Payer: Self-pay | Admitting: Orthopaedic Surgery

## 2018-10-02 DIAGNOSIS — M1611 Unilateral primary osteoarthritis, right hip: Secondary | ICD-10-CM | POA: Insufficient documentation

## 2018-10-12 DIAGNOSIS — Z452 Encounter for adjustment and management of vascular access device: Secondary | ICD-10-CM | POA: Diagnosis not present

## 2018-10-12 DIAGNOSIS — R339 Retention of urine, unspecified: Secondary | ICD-10-CM | POA: Diagnosis not present

## 2018-10-28 ENCOUNTER — Other Ambulatory Visit: Payer: Self-pay

## 2018-10-28 ENCOUNTER — Ambulatory Visit (INDEPENDENT_AMBULATORY_CARE_PROVIDER_SITE_OTHER): Payer: Medicare Other | Admitting: Orthopaedic Surgery

## 2018-10-28 VITALS — BP 111/41 | HR 73 | Ht 62.0 in | Wt 167.0 lb

## 2018-10-28 DIAGNOSIS — M1611 Unilateral primary osteoarthritis, right hip: Secondary | ICD-10-CM | POA: Diagnosis not present

## 2018-10-28 NOTE — Progress Notes (Signed)
Office Visit Note   Patient: Brooke Gutierrez           Date of Birth: 23-Oct-1935           MRN: [id number] Visit Date: 10/28/2018              Requested by: Chipper Herb, MD [address] [place],  [place] [address] PCP: Chipper Herb, MD   Assessment & Plan: Visit Diagnoses:  1. Unilateral primary osteoarthritis, right hip     Plan: Injection performed right hip supine position 22-gauge spinal needle intra-articular with excellent relief of pain.  She can walk in the office without limping and at least had temporary pain relief.  We reviewed the previous x-rays and discussed with her that right total hip arthroplasty will likely be required.  I will recheck her in 3 months she can continue Tylenol arthritis and continue her good diabetic management with hemoglobin A1c last test being 7.0.  She would need preoperative clearance by Dr. Sherrie Sport if she decides to proceed with elective right total up arthroplasty.  Recheck 3 months.  Follow-Up Instructions: No follow-ups on file.   Orders:  Orders Placed This Encounter  Procedures  . Large Joint Inj: R hip joint   No orders of the defined types were placed in this encounter.     Procedures: Large Joint Inj: R hip joint on 10/28/2018 3:05 PM Details: anterior approach Medications: 0.5 mL lidocaine 1 %; 40 mg methylPREDNISolone acetate 40 MG/ML; 2 mL bupivacaine 0.5 %      Clinical Data: No additional findings.   Subjective: Chief Complaint  Patient presents with  . Right Hip - Pain    HPI 83 year old female seen with right severe hip osteoarthritis for follow-up after 09/30/2018 office visit.  We discussed total hip arthroplasty on previous visit and patient is requesting hip injection today and is concerned about her age for undergoing surgery and would like to avoid surgery if possible.  Patient's had right groin pain with ambulation which gets worse if she is more active.  X-ray 09/22/2018 showed severe right hip  degenerative changes with some changes in the head suggesting possible avascular necrosis.  She had complete loss of joint space subchondral sclerosis subchondral cyst formation in the weightbearing portion of the head.  Opposite left hip showed some mild osteoarthritic changes with mild joint space narrowing.  Review of Systems 14 point update unchanged from 09/30/2018 office visit.  She does have a history of nonalcoholic cirrhosis, history of lymphoma peptic ulcer disease and hypertension of note.  Non-smoker.   Objective: Vital Signs: BP (!) 111/41   Pulse 73   Ht 5\' 2"  (1.575 m)   Wt 167 lb (75.8 kg)   BMI 30.54 kg/m   Physical Exam Constitutional:      Appearance: She is well-developed.  HENT:     Head: Normocephalic.     Right Ear: External ear normal.     Left Ear: External ear normal.  Eyes:     Pupils: Pupils are equal, round, and reactive to light.  Neck:     Thyroid: No thyromegaly.     Trachea: No tracheal deviation.  Cardiovascular:     Rate and Rhythm: Normal rate.  Pulmonary:     Effort: Pulmonary effort is normal.  Abdominal:     Palpations: Abdomen is soft.  Skin:    General: Skin is warm and dry.  Neurological:     Mental Status: She is alert and  oriented to person, place, and time.  Psychiatric:        Behavior: Behavior normal.     Ortho Exam patient has 5 degrees internal rotation right hip with extreme pain and cries out.  10 degree hip flexion contracture.  40 degrees external rotation with mild discomfort.  Opposite left hip as internal/external rotation 3040 degrees with no pain or discomfort.  Mild knee crepitus with knee flexion extension distal pulses are intact no sciatic notch tenderness mild trochanteric bursal tenderness.  Specialty Comments:  No specialty comments available.  Imaging: No results found.   PMFS History: Patient Active Problem List   Diagnosis Date Noted  . Unilateral primary osteoarthritis, right hip 10/02/2018   No  past medical history on file.  No family history on file.  No past surgical history on file. Social History   Occupational History  . Not on file  Tobacco Use  . Smoking status: Never Smoker  . Smokeless tobacco: Never Used  Substance and Sexual Activity  . Alcohol use: Not on file  . Drug use: Not on file  . Sexual activity: Not on file

## 2018-10-29 ENCOUNTER — Encounter: Payer: Self-pay | Admitting: Orthopaedic Surgery

## 2018-11-01 DIAGNOSIS — N390 Urinary tract infection, site not specified: Secondary | ICD-10-CM | POA: Diagnosis not present

## 2018-11-01 MED ORDER — METHYLPREDNISOLONE ACETATE 40 MG/ML IJ SUSP
40.0000 mg | INTRAMUSCULAR | Status: AC | PRN
  Administered 2018-10-28: 40 mg via INTRA_ARTICULAR

## 2018-11-01 MED ORDER — BUPIVACAINE HCL 0.5 % IJ SOLN
2.0000 mL | INTRAMUSCULAR | Status: AC | PRN
  Administered 2018-10-28: 2 mL via INTRA_ARTICULAR

## 2018-11-01 MED ORDER — LIDOCAINE HCL 1 % IJ SOLN
0.5000 mL | INTRAMUSCULAR | Status: AC | PRN
  Administered 2018-10-28: .5 mL

## 2018-11-09 DIAGNOSIS — R339 Retention of urine, unspecified: Secondary | ICD-10-CM | POA: Diagnosis not present

## 2018-11-23 DIAGNOSIS — Z452 Encounter for adjustment and management of vascular access device: Secondary | ICD-10-CM | POA: Diagnosis not present

## 2018-12-09 DIAGNOSIS — R339 Retention of urine, unspecified: Secondary | ICD-10-CM | POA: Diagnosis not present

## 2018-12-21 DIAGNOSIS — Z0000 Encounter for general adult medical examination without abnormal findings: Secondary | ICD-10-CM | POA: Diagnosis not present

## 2018-12-21 DIAGNOSIS — I10 Essential (primary) hypertension: Secondary | ICD-10-CM | POA: Diagnosis not present

## 2018-12-21 DIAGNOSIS — K219 Gastro-esophageal reflux disease without esophagitis: Secondary | ICD-10-CM | POA: Diagnosis not present

## 2018-12-21 DIAGNOSIS — E7849 Other hyperlipidemia: Secondary | ICD-10-CM | POA: Diagnosis not present

## 2018-12-21 DIAGNOSIS — E119 Type 2 diabetes mellitus without complications: Secondary | ICD-10-CM | POA: Diagnosis not present

## 2018-12-22 DIAGNOSIS — M79672 Pain in left foot: Secondary | ICD-10-CM | POA: Diagnosis not present

## 2018-12-22 DIAGNOSIS — E1140 Type 2 diabetes mellitus with diabetic neuropathy, unspecified: Secondary | ICD-10-CM | POA: Diagnosis not present

## 2018-12-22 DIAGNOSIS — L110 Acquired keratosis follicularis: Secondary | ICD-10-CM | POA: Diagnosis not present

## 2018-12-22 DIAGNOSIS — M79671 Pain in right foot: Secondary | ICD-10-CM | POA: Diagnosis not present

## 2019-01-04 DIAGNOSIS — Z452 Encounter for adjustment and management of vascular access device: Secondary | ICD-10-CM | POA: Diagnosis not present

## 2019-01-07 DIAGNOSIS — R339 Retention of urine, unspecified: Secondary | ICD-10-CM | POA: Diagnosis not present

## 2019-01-27 ENCOUNTER — Ambulatory Visit (INDEPENDENT_AMBULATORY_CARE_PROVIDER_SITE_OTHER): Payer: Medicare Other | Admitting: Orthopaedic Surgery

## 2019-01-27 ENCOUNTER — Encounter: Payer: Self-pay | Admitting: Orthopaedic Surgery

## 2019-01-27 VITALS — Ht 62.0 in | Wt 170.0 lb

## 2019-01-27 DIAGNOSIS — M1611 Unilateral primary osteoarthritis, right hip: Secondary | ICD-10-CM | POA: Diagnosis not present

## 2019-01-27 NOTE — Progress Notes (Signed)
Office Visit Note   Patient: Brooke Gutierrez           Date of Birth: 02-Oct-1935           MRN: [id number] Visit Date: 01/27/2019              Requested by: No referring provider defined for this encounter. PCP: Chipper Herb, MD (Inactive)   Assessment & Plan: Visit Diagnoses:  1. Unilateral primary osteoarthritis, right hip     Plan: Hip injection performed with good relief.  She still using her walker but states previous injection did well.  She can follow-up with me as needed.  Again we discussed total hip arthroplasty if she changes her mind she can call let us know.  Follow-Up Instructions: Return in about 3 months (around 04/29/2019).   Orders:  Orders Placed This Encounter  Procedures  . Large Joint Inj   No orders of the defined types were placed in this encounter.     Procedures: Large Joint Inj: R hip joint on 01/28/2019 11:37 AM Details: anterior approach Medications: 0.5 mL lidocaine 1 %; 40 mg methylPREDNISolone acetate 40 MG/ML; 2 mL bupivacaine 0.25 %      Clinical Data: No additional findings.   Subjective: Chief Complaint  Patient presents with  . Right Hip - Pain, Follow-up    HPI 83 year old female returns with bone-on-bone changes in her right hip recurrent right groin pain and with ambulation with a limp and use of a walker.  Her husband has 2 bad knees he also uses a walker and neither one of them want to consider surgical intervention at this point.  Patient's had previous surgeries and states that her age she would like to have a repeat injection since the previous one in August gave her good relief.  Review of Systems 14 point system update unchanged from August 2020 office visit other than as mentioned in HPI.  Objective: Vital Signs: Ht 5\' 2"  (1.575 m)   Wt 170 lb (77.1 kg)   BMI 31.09 kg/m   Physical Exam Constitutional:      Appearance: She is well-developed.  HENT:     Head: Normocephalic.     Right Ear: External ear  normal.     Left Ear: External ear normal.  Eyes:     Pupils: Pupils are equal, round, and reactive to light.  Neck:     Thyroid: No thyromegaly.     Trachea: No tracheal deviation.  Cardiovascular:     Rate and Rhythm: Normal rate.  Pulmonary:     Effort: Pulmonary effort is normal.  Abdominal:     Palpations: Abdomen is soft.  Skin:    General: Skin is warm and dry.  Neurological:     Mental Status: She is alert and oriented to person, place, and time.  Psychiatric:        Behavior: Behavior normal.     Ortho Exam patient's 5 degrees internal rotation right hip reproduces her pain no pain with left hip internal/external rotation 30 to 40 degrees.  She is amatory Trendelenburg gait mild tenderness over the sciatic notch and trochanter but extreme pain in the groin with hip internal rotation particularly.  Knees reach full extension.  Specialty Comments:  No specialty comments available.  Imaging: No results found.   PMFS History: Patient Active Problem List   Diagnosis Date Noted  . Unilateral primary osteoarthritis, right hip 10/02/2018   No past medical history on file.  No family  history on file.  No past surgical history on file. Social History   Occupational History  . Not on file  Tobacco Use  . Smoking status: Never Smoker  . Smokeless tobacco: Never Used  Substance and Sexual Activity  . Alcohol use: Not on file  . Drug use: Not on file  . Sexual activity: Not on file

## 2019-01-28 DIAGNOSIS — M1611 Unilateral primary osteoarthritis, right hip: Secondary | ICD-10-CM | POA: Diagnosis not present

## 2019-01-28 MED ORDER — LIDOCAINE HCL 1 % IJ SOLN
0.5000 mL | INTRAMUSCULAR | Status: AC | PRN
  Administered 2019-01-28: .5 mL

## 2019-01-28 MED ORDER — BUPIVACAINE HCL 0.25 % IJ SOLN
2.0000 mL | INTRAMUSCULAR | Status: AC | PRN
  Administered 2019-01-28: 12:00:00 2 mL via INTRA_ARTICULAR

## 2019-01-28 MED ORDER — METHYLPREDNISOLONE ACETATE 40 MG/ML IJ SUSP
40.0000 mg | INTRAMUSCULAR | Status: AC | PRN
  Administered 2019-01-28: 40 mg via INTRA_ARTICULAR

## 2019-02-07 DIAGNOSIS — R339 Retention of urine, unspecified: Secondary | ICD-10-CM | POA: Diagnosis not present

## 2019-02-16 DIAGNOSIS — Z452 Encounter for adjustment and management of vascular access device: Secondary | ICD-10-CM | POA: Diagnosis not present

## 2019-03-08 DIAGNOSIS — R339 Retention of urine, unspecified: Secondary | ICD-10-CM | POA: Diagnosis not present

## 2019-03-16 DIAGNOSIS — M79672 Pain in left foot: Secondary | ICD-10-CM | POA: Diagnosis not present

## 2019-03-16 DIAGNOSIS — L110 Acquired keratosis follicularis: Secondary | ICD-10-CM | POA: Diagnosis not present

## 2019-03-16 DIAGNOSIS — M79671 Pain in right foot: Secondary | ICD-10-CM | POA: Diagnosis not present

## 2019-03-16 DIAGNOSIS — E1140 Type 2 diabetes mellitus with diabetic neuropathy, unspecified: Secondary | ICD-10-CM | POA: Diagnosis not present

## 2019-03-21 DIAGNOSIS — Z0120 Encounter for dental examination and cleaning without abnormal findings: Secondary | ICD-10-CM | POA: Diagnosis not present

## 2019-03-23 DIAGNOSIS — Z0000 Encounter for general adult medical examination without abnormal findings: Secondary | ICD-10-CM | POA: Diagnosis not present

## 2019-03-23 DIAGNOSIS — I10 Essential (primary) hypertension: Secondary | ICD-10-CM | POA: Diagnosis not present

## 2019-03-23 DIAGNOSIS — K2100 Gastro-esophageal reflux disease with esophagitis, without bleeding: Secondary | ICD-10-CM | POA: Diagnosis not present

## 2019-03-23 DIAGNOSIS — E7849 Other hyperlipidemia: Secondary | ICD-10-CM | POA: Diagnosis not present

## 2019-03-23 DIAGNOSIS — E119 Type 2 diabetes mellitus without complications: Secondary | ICD-10-CM | POA: Diagnosis not present

## 2019-03-30 DIAGNOSIS — Z452 Encounter for adjustment and management of vascular access device: Secondary | ICD-10-CM | POA: Diagnosis not present

## 2019-04-06 DIAGNOSIS — R339 Retention of urine, unspecified: Secondary | ICD-10-CM | POA: Diagnosis not present

## 2019-04-18 DIAGNOSIS — S32029A Unspecified fracture of second lumbar vertebra, initial encounter for closed fracture: Secondary | ICD-10-CM | POA: Diagnosis not present

## 2019-04-18 DIAGNOSIS — Z8572 Personal history of non-Hodgkin lymphomas: Secondary | ICD-10-CM | POA: Diagnosis not present

## 2019-04-18 DIAGNOSIS — S32039A Unspecified fracture of third lumbar vertebra, initial encounter for closed fracture: Secondary | ICD-10-CM | POA: Diagnosis not present

## 2019-04-18 DIAGNOSIS — Z79899 Other long term (current) drug therapy: Secondary | ICD-10-CM | POA: Diagnosis not present

## 2019-04-18 DIAGNOSIS — E119 Type 2 diabetes mellitus without complications: Secondary | ICD-10-CM | POA: Diagnosis not present

## 2019-04-18 DIAGNOSIS — I10 Essential (primary) hypertension: Secondary | ICD-10-CM | POA: Diagnosis not present

## 2019-04-18 DIAGNOSIS — K219 Gastro-esophageal reflux disease without esophagitis: Secondary | ICD-10-CM | POA: Diagnosis not present

## 2019-04-18 DIAGNOSIS — S32028A Other fracture of second lumbar vertebra, initial encounter for closed fracture: Secondary | ICD-10-CM | POA: Diagnosis not present

## 2019-04-18 DIAGNOSIS — E7800 Pure hypercholesterolemia, unspecified: Secondary | ICD-10-CM | POA: Diagnosis not present

## 2019-04-18 DIAGNOSIS — S32049A Unspecified fracture of fourth lumbar vertebra, initial encounter for closed fracture: Secondary | ICD-10-CM | POA: Diagnosis not present

## 2019-04-18 DIAGNOSIS — S3992XA Unspecified injury of lower back, initial encounter: Secondary | ICD-10-CM | POA: Diagnosis not present

## 2019-04-18 DIAGNOSIS — Z882 Allergy status to sulfonamides status: Secondary | ICD-10-CM | POA: Diagnosis not present

## 2019-04-18 DIAGNOSIS — Z794 Long term (current) use of insulin: Secondary | ICD-10-CM | POA: Diagnosis not present

## 2019-04-18 DIAGNOSIS — M1990 Unspecified osteoarthritis, unspecified site: Secondary | ICD-10-CM | POA: Diagnosis not present

## 2019-04-18 DIAGNOSIS — Z881 Allergy status to other antibiotic agents status: Secondary | ICD-10-CM | POA: Diagnosis not present

## 2019-04-28 ENCOUNTER — Ambulatory Visit: Payer: Medicare Other | Admitting: Orthopaedic Surgery

## 2019-05-03 DIAGNOSIS — R339 Retention of urine, unspecified: Secondary | ICD-10-CM | POA: Diagnosis not present

## 2019-05-05 ENCOUNTER — Ambulatory Visit (INDEPENDENT_AMBULATORY_CARE_PROVIDER_SITE_OTHER): Payer: Medicare Other | Admitting: Orthopaedic Surgery

## 2019-05-05 ENCOUNTER — Encounter: Payer: Self-pay | Admitting: Orthopaedic Surgery

## 2019-05-05 VITALS — Ht 63.0 in | Wt 162.0 lb

## 2019-05-05 DIAGNOSIS — S32009A Unspecified fracture of unspecified lumbar vertebra, initial encounter for closed fracture: Secondary | ICD-10-CM | POA: Diagnosis not present

## 2019-05-05 DIAGNOSIS — M1611 Unilateral primary osteoarthritis, right hip: Secondary | ICD-10-CM

## 2019-05-05 NOTE — Progress Notes (Signed)
Office Visit Note   Patient: Brooke Gutierrez           Date of Birth: 04/01/1935           MRN: [id number] Visit Date: 05/05/2019              Requested by: No referring provider defined for this encounter. PCP: Chipper Herb, MD (Inactive)   Assessment & Plan: Visit Diagnoses:  1. Unilateral primary osteoarthritis, right hip   2. Closed fracture of transverse process of lumbar vertebra, initial encounter [facility])     Plan: Patient can return she has increased problems with her right hip osteoarthritis.  She is already improved from her transverse process fractures.  We discussed fall prevention and she can return when she has increased hip pain.  We again discussed right total of arthroplasty direct anterior approach likely overnight stay in hospital.  She will call when she is ready to proceed or call if she like to have a repeat injection.  Follow-Up Instructions: No follow-ups on file.   Orders:  No orders of the defined types were placed in this encounter.  No orders of the defined types were placed in this encounter.     Procedures: No procedures performed   Clinical Data: No additional findings.   Subjective: Chief Complaint  Patient presents with  . Right Hip - Follow-up    HPI patient returns for a new injury slipped on the ice 2 weeks ago suffered transverse process fractures at L2-L3 and L4 with the fall.  She has an old T12 injury and had previous hip injection for severe right hip osteoarthritis back in November which is still giving her relief.  She ambulates with a walker but she does her own household chores and cleaning without problems.  She has some groin pain but not as severe as previously.  She had history of UTIs and thinks she has 1 currently and is getting this checked out tomorrow.  Review of Systems review of systems updated unchanged last office note other than as mentioned in HPI.  Principal problem is right hip osteoarthritis which is done  well since injection November 2020.   Objective: Vital Signs: Ht 5\' 3"  (1.6 m)   Wt 162 lb (73.5 kg)   BMI 28.70 kg/m   Physical Exam Constitutional:      Appearance: She is well-developed.  HENT:     Head: Normocephalic.     Right Ear: External ear normal.     Left Ear: External ear normal.  Eyes:     Pupils: Pupils are equal, round, and reactive to light.  Neck:     Thyroid: No thyromegaly.     Trachea: No tracheal deviation.  Cardiovascular:     Rate and Rhythm: Normal rate.  Pulmonary:     Effort: Pulmonary effort is normal.  Abdominal:     Palpations: Abdomen is soft.  Skin:    General: Skin is warm and dry.  Neurological:     Mental Status: She is alert and oriented to person, place, and time.  Psychiatric:        Behavior: Behavior normal.     Ortho Exam patient with has pain with internal rotation right hip none with the left.  No pain with left hip resisted flexion.  Sensation her leg is intact.  Specialty Comments:  No specialty comments available.  Imaging: No results found.   PMFS History: Patient Active Problem List   Diagnosis Date Noted  .  Lumbar transverse process fracture ([place]) 05/05/2019  . Unilateral primary osteoarthritis, right hip 10/02/2018   No past medical history on file.  No family history on file.  No past surgical history on file. Social History   Occupational History  . Not on file  Tobacco Use  . Smoking status: Never Smoker  . Smokeless tobacco: Never Used  Substance and Sexual Activity  . Alcohol use: Not on file  . Drug use: Not on file  . Sexual activity: Not on file

## 2019-05-11 DIAGNOSIS — Z452 Encounter for adjustment and management of vascular access device: Secondary | ICD-10-CM | POA: Diagnosis not present

## 2019-05-25 DIAGNOSIS — E1140 Type 2 diabetes mellitus with diabetic neuropathy, unspecified: Secondary | ICD-10-CM | POA: Diagnosis not present

## 2019-05-25 DIAGNOSIS — M79672 Pain in left foot: Secondary | ICD-10-CM | POA: Diagnosis not present

## 2019-05-25 DIAGNOSIS — L110 Acquired keratosis follicularis: Secondary | ICD-10-CM | POA: Diagnosis not present

## 2019-05-25 DIAGNOSIS — M79671 Pain in right foot: Secondary | ICD-10-CM | POA: Diagnosis not present

## 2019-05-31 DIAGNOSIS — R339 Retention of urine, unspecified: Secondary | ICD-10-CM | POA: Diagnosis not present

## 2019-06-22 DIAGNOSIS — Z452 Encounter for adjustment and management of vascular access device: Secondary | ICD-10-CM | POA: Diagnosis not present

## 2019-06-22 DIAGNOSIS — E7849 Other hyperlipidemia: Secondary | ICD-10-CM | POA: Diagnosis not present

## 2019-06-22 DIAGNOSIS — E119 Type 2 diabetes mellitus without complications: Secondary | ICD-10-CM | POA: Diagnosis not present

## 2019-06-22 DIAGNOSIS — K2100 Gastro-esophageal reflux disease with esophagitis, without bleeding: Secondary | ICD-10-CM | POA: Diagnosis not present

## 2019-06-22 DIAGNOSIS — I10 Essential (primary) hypertension: Secondary | ICD-10-CM | POA: Diagnosis not present

## 2019-06-30 DIAGNOSIS — R339 Retention of urine, unspecified: Secondary | ICD-10-CM | POA: Diagnosis not present

## 2019-07-28 DIAGNOSIS — R339 Retention of urine, unspecified: Secondary | ICD-10-CM | POA: Diagnosis not present

## 2019-08-03 DIAGNOSIS — Z452 Encounter for adjustment and management of vascular access device: Secondary | ICD-10-CM | POA: Diagnosis not present

## 2019-08-24 DIAGNOSIS — Z0120 Encounter for dental examination and cleaning without abnormal findings: Secondary | ICD-10-CM | POA: Diagnosis not present

## 2019-08-25 ENCOUNTER — Encounter (INDEPENDENT_AMBULATORY_CARE_PROVIDER_SITE_OTHER): Payer: Medicare Other | Admitting: Ophthalmology

## 2019-08-25 ENCOUNTER — Encounter: Payer: Self-pay | Admitting: Orthopaedic Surgery

## 2019-08-25 ENCOUNTER — Ambulatory Visit (INDEPENDENT_AMBULATORY_CARE_PROVIDER_SITE_OTHER): Payer: Medicare Other | Admitting: Orthopaedic Surgery

## 2019-08-25 ENCOUNTER — Other Ambulatory Visit: Payer: Self-pay

## 2019-08-25 VITALS — BP 138/81 | HR 75 | Ht 63.0 in | Wt 160.0 lb

## 2019-08-25 DIAGNOSIS — M1611 Unilateral primary osteoarthritis, right hip: Secondary | ICD-10-CM

## 2019-08-25 MED ORDER — LIDOCAINE HCL 1 % IJ SOLN
0.5000 mL | INTRAMUSCULAR | Status: AC | PRN
  Administered 2019-08-25: .5 mL

## 2019-08-25 MED ORDER — BUPIVACAINE HCL 0.25 % IJ SOLN
2.0000 mL | INTRAMUSCULAR | Status: AC | PRN
  Administered 2019-08-25: 2 mL via INTRA_ARTICULAR

## 2019-08-25 MED ORDER — METHYLPREDNISOLONE ACETATE 40 MG/ML IJ SUSP
40.0000 mg | INTRAMUSCULAR | Status: AC | PRN
  Administered 2019-08-25: 40 mg via INTRA_ARTICULAR

## 2019-08-25 NOTE — Progress Notes (Signed)
   Office Visit Note   Patient: Brooke Gutierrez           Date of Birth: June 25, 1935           MRN: [id number] Visit Date: 08/25/2019              Requested by: No referring provider defined for this encounter. PCP: Chipper Herb, MD (Inactive)   Assessment & Plan: Visit Diagnoses:  1. Unilateral primary osteoarthritis, right hip     Plan: Anterior right hip injection performed she tolerated it well she will return if she has recurrent or increased symptoms. Follow-Up Instructions: No follow-ups on file.   Orders:  Orders Placed This Encounter  Procedures  . Large Joint Inj   No orders of the defined types were placed in this encounter.     Procedures: Large Joint Inj: R hip joint on 08/25/2019 10:42 AM Details: anterior approach Medications: 0.5 mL lidocaine 1 %; 2 mL bupivacaine 0.25 %; 40 mg methylPREDNISolone acetate 40 MG/ML      Clinical Data: No additional findings.   Subjective: Chief Complaint  Patient presents with  . Right Hip - Pain    HPI patient transferred with end-stage right hip osteoarthritis.  Previous injection in November gave her good relief for many months and she is requesting a repeat injection.  We have discussed surgery with her and she states she is trying to avoid surgery.  She is amatory with a walker she uses Tylenol arthritis 1 a day.  Review of Systems updated unchanged.   Objective: Vital Signs: BP 138/81   Pulse 75   Ht 5\' 3"  (1.6 m)   Wt 160 lb (72.6 kg)   BMI 28.34 kg/m   Physical Exam Constitutional:      Appearance: She is well-developed.  HENT:     Head: Normocephalic.     Right Ear: External ear normal.     Left Ear: External ear normal.  Eyes:     Pupils: Pupils are equal, round, and reactive to light.  Neck:     Thyroid: No thyromegaly.     Trachea: No tracheal deviation.  Cardiovascular:     Rate and Rhythm: Normal rate.  Pulmonary:     Effort: Pulmonary effort is normal.  Abdominal:      Palpations: Abdomen is soft.  Skin:    General: Skin is warm and dry.  Neurological:     Mental Status: She is alert and oriented to person, place, and time.  Psychiatric:        Behavior: Behavior normal.     Ortho Exam patient can ambulate without the walker but has significant right hip limp severe pain with internal rotation right hip.  Negative logroll left hip.  Specialty Comments:  No specialty comments available.  Imaging: No results found.   PMFS History: Patient Active Problem List   Diagnosis Date Noted  . Lumbar transverse process fracture ([facility]) 05/05/2019  . Unilateral primary osteoarthritis, right hip 10/02/2018   No past medical history on file.  No family history on file.  No past surgical history on file. Social History   Occupational History  . Not on file  Tobacco Use  . Smoking status: Never Smoker  . Smokeless tobacco: Never Used  Substance and Sexual Activity  . Alcohol use: Not on file  . Drug use: Not on file  . Sexual activity: Not on file

## 2019-08-26 DIAGNOSIS — R339 Retention of urine, unspecified: Secondary | ICD-10-CM | POA: Diagnosis not present

## 2019-08-30 ENCOUNTER — Encounter (INDEPENDENT_AMBULATORY_CARE_PROVIDER_SITE_OTHER): Payer: Self-pay | Admitting: Ophthalmology

## 2019-08-30 ENCOUNTER — Other Ambulatory Visit: Payer: Self-pay

## 2019-08-30 ENCOUNTER — Ambulatory Visit (INDEPENDENT_AMBULATORY_CARE_PROVIDER_SITE_OTHER): Payer: Medicare Other | Admitting: Ophthalmology

## 2019-08-30 DIAGNOSIS — H353132 Nonexudative age-related macular degeneration, bilateral, intermediate dry stage: Secondary | ICD-10-CM | POA: Diagnosis not present

## 2019-08-30 DIAGNOSIS — E119 Type 2 diabetes mellitus without complications: Secondary | ICD-10-CM | POA: Diagnosis not present

## 2019-08-30 NOTE — Progress Notes (Signed)
08/30/2019     CHIEF COMPLAINT Patient presents for Diabetic Eye Exam   HISTORY OF PRESENT ILLNESS: Brooke Gutierrez is a 84 y.o. female who presents to the clinic today for:   HPI    Diabetic Eye Exam    Vision is stable.  Associated Symptoms Negative for Flashes and Floaters.  Diabetes characteristics include Type 2.  Blood sugar level is controlled.  Last Blood Glucose 136.  Last A1C 6.          Comments    1 year follow up - OCT OU Patient denies change in vision and overall has no complaints. LBS 136 // A1C 6.0       Last edited by Gerda Diss on 08/30/2019  9:48 AM. (History)      Referring physician: Hurman Horn, MD [place],  [place] [address]  HISTORICAL INFORMATION:   Selected notes from the MEDICAL RECORD NUMBER       CURRENT MEDICATIONS: No current outpatient medications on file. (Ophthalmic Drugs)   No current facility-administered medications for this visit. (Ophthalmic Drugs)   Current Outpatient Medications (Other)  Medication Sig  . clonazePAM (KLONOPIN) 0.5 MG tablet Take 0.5 mg by mouth daily as needed.  Marland Kitchen dexamethasone 0.5 MG/5ML elixir Take 1 mg by mouth 2 (two) times daily as needed.  Marland Kitchen HUMALOG KWIKPEN 100 UNIT/ML KwikPen INJECT 15 UNITS SUBCUTANEOUSLY BEFORE MEAL(S)  . HUMALOG MIX 75/25 (75-25) 100 UNIT/ML SUSP injection INJECT 10 UNITS SUBCUTANEOUSLY BEFORE MEALS  . HYDROcodone-acetaminophen (NORCO/VICODIN) 5-325 MG tablet Take 1 tablet by mouth 3 (three) times daily as needed. for pain (Patient not taking: Reported on 08/25/2019)  . insulin glargine (LANTUS) 100 UNIT/ML injection Inject into the skin.  Marland Kitchen insulin lispro (HUMALOG) 100 UNIT/ML injection Inject into the skin.  Marland Kitchen insulin lispro protamine-lispro (HUMALOG 75/25 MIX) (75-25) 100 UNIT/ML SUSP injection Inject into the skin.  . metFORMIN (GLUCOPHAGE) 500 MG tablet Take 500 mg by mouth 2 (two) times daily.  . Omega-3 Fatty Acids (FISH OIL) 1000 MG CAPS Take by mouth.   . pantoprazole (PROTONIX) 40 MG tablet   . pravastatin (PRAVACHOL) 20 MG tablet Take by mouth.  . propranolol (INDERAL) 10 MG tablet Take 10 mg by mouth 2 (two) times daily.   No current facility-administered medications for this visit. (Other)      REVIEW OF SYSTEMS:    ALLERGIES Allergies  Allergen Reactions  . Ciprofloxacin   . Levaquin [Levofloxacin]   . Sulfa Antibiotics   . Other Rash    Adhesive tape    PAST MEDICAL HISTORY History reviewed. No pertinent past medical history. History reviewed. No pertinent surgical history.  FAMILY HISTORY History reviewed. No pertinent family history.  SOCIAL HISTORY Social History   Tobacco Use  . Smoking status: Never Smoker  . Smokeless tobacco: Never Used  Substance Use Topics  . Alcohol use: Not on file  . Drug use: Not on file         OPHTHALMIC EXAM:  Base Eye Exam    Visual Acuity (Snellen - Linear)      Right Left   Dist [place] 20/25 20/20-1       Tonometry (Tonopen, 9:52 AM)      Right Left   Pressure 8 9       Pupils      Pupils Dark Light Shape React APD   Right PERRL 4 3 Round Slow None   Left PERRL 4 3 Round Slow None  Visual Fields (Counting fingers)      Left Right    Full Full       Extraocular Movement      Right Left    Full Full       Neuro/Psych    Oriented x3: Yes   Mood/Affect: Normal       Dilation    Both eyes: 1.0% Mydriacyl, 2.5% Phenylephrine @ 9:52 AM        Slit Lamp and Fundus Exam    External Exam      Right Left   External Normal Normal       Slit Lamp Exam      Right Left   Lids/Lashes Normal Normal   Conjunctiva/Sclera White and quiet White and quiet   Cornea Clear Clear   Anterior Chamber Deep and quiet Deep and quiet   Iris Round and reactive Round and reactive   Lens Posterior chamber intraocular lens Posterior chamber intraocular lens   Anterior Vitreous Normal Normal       Fundus Exam      Right Left   Posterior Vitreous Normal  Normal   Disc Normal Normal   C/D Ratio 0.4 0.4   Macula Normal Normal   Vessels no DR no DR   Periphery Normal Normal          IMAGING AND PROCEDURES  Imaging and Procedures for 08/30/19  OCT, Retina - OU - Both Eyes       Right Eye Quality was good. Scan locations included subfoveal. Central Foveal Thickness: 341. Progression has been stable. Findings include retinal drusen .   Left Eye Quality was good. Scan locations included subfoveal. Central Foveal Thickness: 258. Progression has been stable. Findings include retinal drusen .                 ASSESSMENT/PLAN:  Intermediate stage nonexudative age-related macular degeneration of both eyes The nature of age--related macular degeneration was discussed with the patient as well as the distinction between dry and wet types. Checking an [place] Grid daily with advice to return immediately should a distortion develop, was given to the patient. The patient 's smoking status now and in the past was determined and advice based on the AREDS study was provided regarding the consumption of antioxidant supplements. AREDS 2 vitamin formulation was recommended. Consumption of dark leafy vegetables and fresh fruits of various colors was recommended. Treatment modalities for wet macular degeneration particularly the use of intravitreal injections of anti-blood vessel growth factors was discussed with the patient. Avastin, Lucentis, and Eylea are the available options. On occasion, therapy includes the use of photodynamic therapy and thermal laser. Stressed to the patient do not rub eyes.  Patient was advised to check Amsler Grid daily and return immediately if changes are noted. Instructions on using the grid were given to the patient. All patient questions were answered.      ICD-10-CM   1. Intermediate stage nonexudative age-related macular degeneration of both eyes  H35.3132 OCT, Retina - OU - Both Eyes  2. Diabetes mellitus without  complication (HCC)  O24.2     1.  2.  3.  Ophthalmic Meds Ordered this visit:  No orders of the defined types were placed in this encounter.      Return in about 1 year (around 08/29/2020) for DILATE OU, OCT.  There are no Patient Instructions on file for this visit.   Explained the diagnoses, plan, and follow up with the patient and they expressed  understanding.  Patient expressed understanding of the importance of proper follow up care.   Clent Demark Kaylan Yates M.D. Diseases & Surgery of the Retina and Vitreous Retina & Diabetic [place] 08/30/19     Abbreviations: M myopia (nearsighted); A astigmatism; H hyperopia (farsighted); P presbyopia; Mrx spectacle prescription;  CTL contact lenses; OD right eye; OS left eye; OU both eyes  XT exotropia; ET esotropia; PEK punctate epithelial keratitis; PEE punctate epithelial erosions; DES dry eye syndrome; MGD meibomian gland dysfunction; ATs artificial tears; PFAT's preservative free artificial tears; [place] nuclear sclerotic cataract; PSC posterior subcapsular cataract; ERM epi-retinal membrane; PVD posterior vitreous detachment; RD retinal detachment; DM diabetes mellitus; DR diabetic retinopathy; NPDR non-proliferative diabetic retinopathy; PDR proliferative diabetic retinopathy; CSME clinically significant macular edema; DME diabetic macular edema; dbh dot blot hemorrhages; CWS cotton wool spot; POAG primary open angle glaucoma; C/D cup-to-disc ratio; HVF humphrey visual field; GVF goldmann visual field; OCT optical coherence tomography; IOP intraocular pressure; BRVO Branch retinal vein occlusion; CRVO central retinal vein occlusion; CRAO central retinal artery occlusion; BRAO branch retinal artery occlusion; RT retinal tear; SB scleral buckle; PPV pars plana vitrectomy; VH Vitreous hemorrhage; PRP panretinal laser photocoagulation; IVK intravitreal kenalog; VMT vitreomacular traction; MH Macular hole;  NVD neovascularization of the disc; NVE  neovascularization elsewhere; AREDS age related eye disease study; ARMD age related macular degeneration; POAG primary open angle glaucoma; EBMD epithelial/anterior basement membrane dystrophy; ACIOL anterior chamber intraocular lens; IOL intraocular lens; PCIOL posterior chamber intraocular lens; Phaco/IOL phacoemulsification with intraocular lens placement; [place] photorefractive keratectomy; LASIK laser assisted in situ keratomileusis; HTN hypertension; DM diabetes mellitus; COPD chronic obstructive pulmonary disease

## 2019-08-30 NOTE — Assessment & Plan Note (Signed)

## 2019-08-31 DIAGNOSIS — E1140 Type 2 diabetes mellitus with diabetic neuropathy, unspecified: Secondary | ICD-10-CM | POA: Diagnosis not present

## 2019-08-31 DIAGNOSIS — M79672 Pain in left foot: Secondary | ICD-10-CM | POA: Diagnosis not present

## 2019-08-31 DIAGNOSIS — M79671 Pain in right foot: Secondary | ICD-10-CM | POA: Diagnosis not present

## 2019-08-31 DIAGNOSIS — L110 Acquired keratosis follicularis: Secondary | ICD-10-CM | POA: Diagnosis not present

## 2019-09-14 DIAGNOSIS — Z452 Encounter for adjustment and management of vascular access device: Secondary | ICD-10-CM | POA: Diagnosis not present

## 2019-09-20 DIAGNOSIS — K2100 Gastro-esophageal reflux disease with esophagitis, without bleeding: Secondary | ICD-10-CM | POA: Diagnosis not present

## 2019-09-20 DIAGNOSIS — E119 Type 2 diabetes mellitus without complications: Secondary | ICD-10-CM | POA: Diagnosis not present

## 2019-09-20 DIAGNOSIS — E7849 Other hyperlipidemia: Secondary | ICD-10-CM | POA: Diagnosis not present

## 2019-09-20 DIAGNOSIS — I10 Essential (primary) hypertension: Secondary | ICD-10-CM | POA: Diagnosis not present

## 2019-09-27 DIAGNOSIS — R339 Retention of urine, unspecified: Secondary | ICD-10-CM | POA: Diagnosis not present

## 2019-10-26 DIAGNOSIS — Z452 Encounter for adjustment and management of vascular access device: Secondary | ICD-10-CM | POA: Diagnosis not present

## 2019-10-27 DIAGNOSIS — R339 Retention of urine, unspecified: Secondary | ICD-10-CM | POA: Diagnosis not present

## 2019-11-01 DIAGNOSIS — L570 Actinic keratosis: Secondary | ICD-10-CM | POA: Diagnosis not present

## 2019-11-09 DIAGNOSIS — M79672 Pain in left foot: Secondary | ICD-10-CM | POA: Diagnosis not present

## 2019-11-09 DIAGNOSIS — E1140 Type 2 diabetes mellitus with diabetic neuropathy, unspecified: Secondary | ICD-10-CM | POA: Diagnosis not present

## 2019-11-09 DIAGNOSIS — L110 Acquired keratosis follicularis: Secondary | ICD-10-CM | POA: Diagnosis not present

## 2019-11-09 DIAGNOSIS — M79671 Pain in right foot: Secondary | ICD-10-CM | POA: Diagnosis not present

## 2019-11-25 DIAGNOSIS — R339 Retention of urine, unspecified: Secondary | ICD-10-CM | POA: Diagnosis not present

## 2019-12-07 DIAGNOSIS — Z452 Encounter for adjustment and management of vascular access device: Secondary | ICD-10-CM | POA: Diagnosis not present

## 2019-12-16 DIAGNOSIS — Z23 Encounter for immunization: Secondary | ICD-10-CM | POA: Diagnosis not present

## 2019-12-21 DIAGNOSIS — E7849 Other hyperlipidemia: Secondary | ICD-10-CM | POA: Diagnosis not present

## 2019-12-21 DIAGNOSIS — I10 Essential (primary) hypertension: Secondary | ICD-10-CM | POA: Diagnosis not present

## 2019-12-21 DIAGNOSIS — E119 Type 2 diabetes mellitus without complications: Secondary | ICD-10-CM | POA: Diagnosis not present

## 2019-12-21 DIAGNOSIS — F419 Anxiety disorder, unspecified: Secondary | ICD-10-CM | POA: Diagnosis not present

## 2019-12-23 DIAGNOSIS — R339 Retention of urine, unspecified: Secondary | ICD-10-CM | POA: Diagnosis not present

## 2020-01-18 DIAGNOSIS — Z452 Encounter for adjustment and management of vascular access device: Secondary | ICD-10-CM | POA: Diagnosis not present

## 2020-01-19 DIAGNOSIS — R339 Retention of urine, unspecified: Secondary | ICD-10-CM | POA: Diagnosis not present

## 2020-01-25 DIAGNOSIS — M79671 Pain in right foot: Secondary | ICD-10-CM | POA: Diagnosis not present

## 2020-01-25 DIAGNOSIS — L110 Acquired keratosis follicularis: Secondary | ICD-10-CM | POA: Diagnosis not present

## 2020-01-25 DIAGNOSIS — M79672 Pain in left foot: Secondary | ICD-10-CM | POA: Diagnosis not present

## 2020-01-25 DIAGNOSIS — E1140 Type 2 diabetes mellitus with diabetic neuropathy, unspecified: Secondary | ICD-10-CM | POA: Diagnosis not present

## 2020-02-16 DIAGNOSIS — N3000 Acute cystitis without hematuria: Secondary | ICD-10-CM | POA: Diagnosis not present

## 2020-02-16 DIAGNOSIS — N938 Other specified abnormal uterine and vaginal bleeding: Secondary | ICD-10-CM | POA: Diagnosis not present

## 2020-02-16 DIAGNOSIS — N952 Postmenopausal atrophic vaginitis: Secondary | ICD-10-CM | POA: Diagnosis not present

## 2020-02-16 DIAGNOSIS — Z6829 Body mass index (BMI) 29.0-29.9, adult: Secondary | ICD-10-CM | POA: Diagnosis not present

## 2020-02-17 DIAGNOSIS — R339 Retention of urine, unspecified: Secondary | ICD-10-CM | POA: Diagnosis not present

## 2020-02-29 DIAGNOSIS — Z452 Encounter for adjustment and management of vascular access device: Secondary | ICD-10-CM | POA: Diagnosis not present

## 2020-03-20 DIAGNOSIS — R339 Retention of urine, unspecified: Secondary | ICD-10-CM | POA: Diagnosis not present

## 2020-03-22 DIAGNOSIS — E1169 Type 2 diabetes mellitus with other specified complication: Secondary | ICD-10-CM | POA: Diagnosis not present

## 2020-03-22 DIAGNOSIS — E7849 Other hyperlipidemia: Secondary | ICD-10-CM | POA: Diagnosis not present

## 2020-03-22 DIAGNOSIS — M160 Bilateral primary osteoarthritis of hip: Secondary | ICD-10-CM | POA: Diagnosis not present

## 2020-03-22 DIAGNOSIS — K2100 Gastro-esophageal reflux disease with esophagitis, without bleeding: Secondary | ICD-10-CM | POA: Diagnosis not present

## 2020-03-23 DIAGNOSIS — E7849 Other hyperlipidemia: Secondary | ICD-10-CM | POA: Diagnosis not present

## 2020-03-23 DIAGNOSIS — F419 Anxiety disorder, unspecified: Secondary | ICD-10-CM | POA: Diagnosis not present

## 2020-03-23 DIAGNOSIS — K2100 Gastro-esophageal reflux disease with esophagitis, without bleeding: Secondary | ICD-10-CM | POA: Diagnosis not present

## 2020-03-23 DIAGNOSIS — M160 Bilateral primary osteoarthritis of hip: Secondary | ICD-10-CM | POA: Diagnosis not present

## 2020-04-11 DIAGNOSIS — Z452 Encounter for adjustment and management of vascular access device: Secondary | ICD-10-CM | POA: Diagnosis not present

## 2020-04-11 DIAGNOSIS — M79672 Pain in left foot: Secondary | ICD-10-CM | POA: Diagnosis not present

## 2020-04-11 DIAGNOSIS — L110 Acquired keratosis follicularis: Secondary | ICD-10-CM | POA: Diagnosis not present

## 2020-04-11 DIAGNOSIS — E1140 Type 2 diabetes mellitus with diabetic neuropathy, unspecified: Secondary | ICD-10-CM | POA: Diagnosis not present

## 2020-04-11 DIAGNOSIS — M79671 Pain in right foot: Secondary | ICD-10-CM | POA: Diagnosis not present

## 2020-04-17 DIAGNOSIS — R339 Retention of urine, unspecified: Secondary | ICD-10-CM | POA: Diagnosis not present

## 2020-05-08 DIAGNOSIS — R339 Retention of urine, unspecified: Secondary | ICD-10-CM | POA: Diagnosis not present

## 2020-05-23 DIAGNOSIS — Z452 Encounter for adjustment and management of vascular access device: Secondary | ICD-10-CM | POA: Diagnosis not present

## 2020-05-29 DIAGNOSIS — A09 Infectious gastroenteritis and colitis, unspecified: Secondary | ICD-10-CM | POA: Diagnosis not present

## 2020-05-29 DIAGNOSIS — Z6829 Body mass index (BMI) 29.0-29.9, adult: Secondary | ICD-10-CM | POA: Diagnosis not present

## 2020-05-30 DIAGNOSIS — R197 Diarrhea, unspecified: Secondary | ICD-10-CM | POA: Diagnosis not present

## 2020-06-06 DIAGNOSIS — R339 Retention of urine, unspecified: Secondary | ICD-10-CM | POA: Diagnosis not present

## 2020-06-20 DIAGNOSIS — N938 Other specified abnormal uterine and vaginal bleeding: Secondary | ICD-10-CM | POA: Diagnosis not present

## 2020-06-20 DIAGNOSIS — M160 Bilateral primary osteoarthritis of hip: Secondary | ICD-10-CM | POA: Diagnosis not present

## 2020-06-20 DIAGNOSIS — E1169 Type 2 diabetes mellitus with other specified complication: Secondary | ICD-10-CM | POA: Diagnosis not present

## 2020-06-20 DIAGNOSIS — F419 Anxiety disorder, unspecified: Secondary | ICD-10-CM | POA: Diagnosis not present

## 2020-06-27 DIAGNOSIS — L110 Acquired keratosis follicularis: Secondary | ICD-10-CM | POA: Diagnosis not present

## 2020-06-27 DIAGNOSIS — M79672 Pain in left foot: Secondary | ICD-10-CM | POA: Diagnosis not present

## 2020-06-27 DIAGNOSIS — M79671 Pain in right foot: Secondary | ICD-10-CM | POA: Diagnosis not present

## 2020-06-27 DIAGNOSIS — E1140 Type 2 diabetes mellitus with diabetic neuropathy, unspecified: Secondary | ICD-10-CM | POA: Diagnosis not present

## 2020-07-04 DIAGNOSIS — Z452 Encounter for adjustment and management of vascular access device: Secondary | ICD-10-CM | POA: Diagnosis not present

## 2020-07-04 DIAGNOSIS — R339 Retention of urine, unspecified: Secondary | ICD-10-CM | POA: Diagnosis not present

## 2020-07-10 DIAGNOSIS — H04123 Dry eye syndrome of bilateral lacrimal glands: Secondary | ICD-10-CM | POA: Diagnosis not present

## 2020-07-10 DIAGNOSIS — H40033 Anatomical narrow angle, bilateral: Secondary | ICD-10-CM | POA: Diagnosis not present

## 2020-08-01 DIAGNOSIS — R339 Retention of urine, unspecified: Secondary | ICD-10-CM | POA: Diagnosis not present

## 2020-08-09 ENCOUNTER — Ambulatory Visit (INDEPENDENT_AMBULATORY_CARE_PROVIDER_SITE_OTHER): Payer: Medicare Other | Admitting: Ophthalmology

## 2020-08-09 ENCOUNTER — Other Ambulatory Visit: Payer: Self-pay

## 2020-08-09 ENCOUNTER — Encounter (INDEPENDENT_AMBULATORY_CARE_PROVIDER_SITE_OTHER): Payer: Self-pay | Admitting: Ophthalmology

## 2020-08-09 DIAGNOSIS — E119 Type 2 diabetes mellitus without complications: Secondary | ICD-10-CM

## 2020-08-09 DIAGNOSIS — H353132 Nonexudative age-related macular degeneration, bilateral, intermediate dry stage: Secondary | ICD-10-CM

## 2020-08-09 NOTE — Assessment & Plan Note (Signed)
No change over time, 1 year follow-up, no diabetic retinopathy detected

## 2020-08-09 NOTE — Assessment & Plan Note (Signed)
Mild ARMD, not at high risk at all

## 2020-08-09 NOTE — Progress Notes (Signed)
08/09/2020     CHIEF COMPLAINT Patient presents for Retina Follow Up (1 Year F/U OU//Pt sts she can see better with new glasses OU. Pt denies any changes to [facility] or new symptoms OU.)   HISTORY OF PRESENT ILLNESS: Brooke Gutierrez is a 85 y.o. female who presents to the clinic today for:   HPI    Retina Follow Up    Diagnosis: Dry AMD   Laterality: both eyes   Onset: 1 year ago   Severity: mild   Duration: 1 year   Course: stable   Comments: 1 Year F/U OU  Pt sts she can see better with new glasses OU. Pt denies any changes to [place] or new symptoms OU.       Last edited by Rockie Neighbours, [place] on 08/09/2020 10:06 AM. (History)      Referring physician: No referring provider defined for this encounter.  HISTORICAL INFORMATION:   Selected notes from the MEDICAL RECORD NUMBER       CURRENT MEDICATIONS: No current outpatient medications on file. (Ophthalmic Drugs)   No current facility-administered medications for this visit. (Ophthalmic Drugs)   Current Outpatient Medications (Other)  Medication Sig  . clonazePAM (KLONOPIN) 0.5 MG tablet Take 0.5 mg by mouth daily as needed.  Marland Kitchen dexamethasone 0.5 MG/5ML elixir Take 1 mg by mouth 2 (two) times daily as needed.  Marland Kitchen HUMALOG KWIKPEN 100 UNIT/ML KwikPen INJECT 15 UNITS SUBCUTANEOUSLY BEFORE MEAL(S)  . HUMALOG MIX 75/25 (75-25) 100 UNIT/ML SUSP injection INJECT 10 UNITS SUBCUTANEOUSLY BEFORE MEALS  . HYDROcodone-acetaminophen (NORCO/VICODIN) 5-325 MG tablet Take 1 tablet by mouth 3 (three) times daily as needed. for pain (Patient not taking: Reported on 08/25/2019)  . insulin glargine (LANTUS) 100 UNIT/ML injection Inject into the skin.  Marland Kitchen insulin lispro (HUMALOG) 100 UNIT/ML injection Inject into the skin.  Marland Kitchen insulin lispro protamine-lispro (HUMALOG 75/25 MIX) (75-25) 100 UNIT/ML SUSP injection Inject into the skin.  . metFORMIN (GLUCOPHAGE) 500 MG tablet Take 500 mg by mouth 2 (two) times daily.  . Omega-3 Fatty Acids (FISH OIL)  1000 MG CAPS Take by mouth.  . pantoprazole (PROTONIX) 40 MG tablet   . pravastatin (PRAVACHOL) 20 MG tablet Take by mouth.  . propranolol (INDERAL) 10 MG tablet Take 10 mg by mouth 2 (two) times daily.   No current facility-administered medications for this visit. (Other)      REVIEW OF SYSTEMS:    ALLERGIES Allergies  Allergen Reactions  . Ciprofloxacin   . Levaquin [Levofloxacin]   . Sulfa Antibiotics   . Other Rash    Adhesive tape    PAST MEDICAL HISTORY History reviewed. No pertinent past medical history. History reviewed. No pertinent surgical history.  FAMILY HISTORY History reviewed. No pertinent family history.  SOCIAL HISTORY Social History   Tobacco Use  . Smoking status: Never Smoker  . Smokeless tobacco: Never Used         OPHTHALMIC EXAM: Base Eye Exam    Visual Acuity (ETDRS)      Right Left   Dist cc 20/25 -2 20/30 -1   Dist ph cc  NI   Correction: Glasses       Tonometry (Tonopen, 10:09 AM)      Right Left   Pressure 15 15       Pupils      Pupils Dark Light Shape React APD   Right PERRL 5 4 Round Slow None   Left PERRL 5 4 Round Slow None  Visual Fields (Counting fingers)      Left Right    Full Full       Extraocular Movement      Right Left    Full Full       Neuro/Psych    Oriented x3: Yes   Mood/Affect: Normal       Dilation    Both eyes: 1.0% Mydriacyl, 2.5% Phenylephrine @ 10:09 AM        Slit Lamp and Fundus Exam    External Exam      Right Left   External Normal Normal       Slit Lamp Exam      Right Left   Lids/Lashes Normal Normal   Conjunctiva/Sclera White and quiet White and quiet   Cornea Clear Clear   Anterior Chamber Deep and quiet Deep and quiet   Iris Round and reactive Round and reactive   Lens Posterior chamber intraocular lens Posterior chamber intraocular lens   Anterior Vitreous Normal Normal       Fundus Exam      Right Left   Posterior Vitreous Normal Normal   Disc  Normal Normal   C/D Ratio 0.4 0.4   Macula Normal Normal   Vessels no DR no DR   Periphery Normal Normal          IMAGING AND PROCEDURES  Imaging and Procedures for 08/09/20  OCT, Retina - OU - Both Eyes       Right Eye Quality was good. Scan locations included subfoveal. Central Foveal Thickness: 327. Progression has been stable. Findings include retinal drusen , no IRF, no SRF.   Left Eye Quality was good. Scan locations included subfoveal. Central Foveal Thickness: 257. Progression has been stable. Findings include retinal drusen , no IRF, no SRF.   Notes No active maculopathy in either eye                ASSESSMENT/PLAN:  Diabetes mellitus without complication (HCC) No change over time, 1 year follow-up, no diabetic retinopathy detected  Intermediate stage nonexudative age-related macular degeneration of both eyes Mild ARMD, not at high risk at all      ICD-10-CM   1. Intermediate stage nonexudative age-related macular degeneration of both eyes  H35.3132 OCT, Retina - OU - Both Eyes  2. Diabetes mellitus without complication (HCC)  K09.3     1.  OU, stable, will continue to monitor and with low risk of low hemoglobin A1c and good blood sugar control may return follow-up visit in 2 years to look for diabetic eye disease development  2.  Low risk ARMD OU stable  3.  Ophthalmic Meds Ordered this visit:  No orders of the defined types were placed in this encounter.      Return in about 2 years (around 08/10/2022) for COLOR FP, DILATE OU.  There are no Patient Instructions on file for this visit.   Explained the diagnoses, plan, and follow up with the patient and they expressed understanding.  Patient expressed understanding of the importance of proper follow up care.   Clent Demark Keyontay Stolz M.D. Diseases & Surgery of the Retina and Vitreous Retina & Diabetic [place] 08/09/20     Abbreviations: M myopia (nearsighted); A astigmatism; H hyperopia  (farsighted); P presbyopia; Mrx spectacle prescription;  CTL contact lenses; OD right eye; OS left eye; OU both eyes  XT exotropia; ET esotropia; PEK punctate epithelial keratitis; PEE punctate epithelial erosions; DES dry eye syndrome; MGD meibomian gland dysfunction;  ATs artificial tears; PFAT's preservative free artificial tears; [place] nuclear sclerotic cataract; PSC posterior subcapsular cataract; ERM epi-retinal membrane; PVD posterior vitreous detachment; RD retinal detachment; DM diabetes mellitus; DR diabetic retinopathy; NPDR non-proliferative diabetic retinopathy; PDR proliferative diabetic retinopathy; CSME clinically significant macular edema; DME diabetic macular edema; dbh dot blot hemorrhages; CWS cotton wool spot; POAG primary open angle glaucoma; C/D cup-to-disc ratio; HVF humphrey visual field; GVF goldmann visual field; OCT optical coherence tomography; IOP intraocular pressure; BRVO Branch retinal vein occlusion; CRVO central retinal vein occlusion; CRAO central retinal artery occlusion; BRAO branch retinal artery occlusion; RT retinal tear; SB scleral buckle; PPV pars plana vitrectomy; VH Vitreous hemorrhage; PRP panretinal laser photocoagulation; IVK intravitreal kenalog; VMT vitreomacular traction; MH Macular hole;  NVD neovascularization of the disc; NVE neovascularization elsewhere; AREDS age related eye disease study; ARMD age related macular degeneration; POAG primary open angle glaucoma; EBMD epithelial/anterior basement membrane dystrophy; ACIOL anterior chamber intraocular lens; IOL intraocular lens; PCIOL posterior chamber intraocular lens; Phaco/IOL phacoemulsification with intraocular lens placement; [place] photorefractive keratectomy; LASIK laser assisted in situ keratomileusis; HTN hypertension; DM diabetes mellitus; COPD chronic obstructive pulmonary disease

## 2020-08-14 DIAGNOSIS — Z452 Encounter for adjustment and management of vascular access device: Secondary | ICD-10-CM | POA: Diagnosis not present

## 2020-08-30 ENCOUNTER — Encounter (INDEPENDENT_AMBULATORY_CARE_PROVIDER_SITE_OTHER): Payer: Medicare Other | Admitting: Ophthalmology

## 2020-08-31 DIAGNOSIS — R339 Retention of urine, unspecified: Secondary | ICD-10-CM | POA: Diagnosis not present

## 2020-09-05 DIAGNOSIS — E1140 Type 2 diabetes mellitus with diabetic neuropathy, unspecified: Secondary | ICD-10-CM | POA: Diagnosis not present

## 2020-09-05 DIAGNOSIS — M79672 Pain in left foot: Secondary | ICD-10-CM | POA: Diagnosis not present

## 2020-09-05 DIAGNOSIS — L110 Acquired keratosis follicularis: Secondary | ICD-10-CM | POA: Diagnosis not present

## 2020-09-05 DIAGNOSIS — M79671 Pain in right foot: Secondary | ICD-10-CM | POA: Diagnosis not present

## 2020-09-15 DIAGNOSIS — H04123 Dry eye syndrome of bilateral lacrimal glands: Secondary | ICD-10-CM | POA: Diagnosis not present

## 2020-09-19 DIAGNOSIS — R0602 Shortness of breath: Secondary | ICD-10-CM | POA: Diagnosis not present

## 2020-09-19 DIAGNOSIS — N938 Other specified abnormal uterine and vaginal bleeding: Secondary | ICD-10-CM | POA: Diagnosis not present

## 2020-09-19 DIAGNOSIS — E1169 Type 2 diabetes mellitus with other specified complication: Secondary | ICD-10-CM | POA: Diagnosis not present

## 2020-09-19 DIAGNOSIS — E7849 Other hyperlipidemia: Secondary | ICD-10-CM | POA: Diagnosis not present

## 2020-09-19 DIAGNOSIS — M160 Bilateral primary osteoarthritis of hip: Secondary | ICD-10-CM | POA: Diagnosis not present

## 2020-09-19 DIAGNOSIS — F419 Anxiety disorder, unspecified: Secondary | ICD-10-CM | POA: Diagnosis not present

## 2020-09-19 DIAGNOSIS — K2100 Gastro-esophageal reflux disease with esophagitis, without bleeding: Secondary | ICD-10-CM | POA: Diagnosis not present

## 2020-09-25 DIAGNOSIS — Z452 Encounter for adjustment and management of vascular access device: Secondary | ICD-10-CM | POA: Diagnosis not present

## 2020-10-01 DIAGNOSIS — R339 Retention of urine, unspecified: Secondary | ICD-10-CM | POA: Diagnosis not present

## 2020-10-31 DIAGNOSIS — R339 Retention of urine, unspecified: Secondary | ICD-10-CM | POA: Diagnosis not present

## 2020-11-06 DIAGNOSIS — Z452 Encounter for adjustment and management of vascular access device: Secondary | ICD-10-CM | POA: Diagnosis not present

## 2020-11-28 DIAGNOSIS — M79671 Pain in right foot: Secondary | ICD-10-CM | POA: Diagnosis not present

## 2020-11-28 DIAGNOSIS — E1140 Type 2 diabetes mellitus with diabetic neuropathy, unspecified: Secondary | ICD-10-CM | POA: Diagnosis not present

## 2020-11-28 DIAGNOSIS — L110 Acquired keratosis follicularis: Secondary | ICD-10-CM | POA: Diagnosis not present

## 2020-11-28 DIAGNOSIS — M79672 Pain in left foot: Secondary | ICD-10-CM | POA: Diagnosis not present

## 2020-12-18 DIAGNOSIS — Z452 Encounter for adjustment and management of vascular access device: Secondary | ICD-10-CM | POA: Diagnosis not present

## 2020-12-20 DIAGNOSIS — E1169 Type 2 diabetes mellitus with other specified complication: Secondary | ICD-10-CM | POA: Diagnosis not present

## 2020-12-20 DIAGNOSIS — I10 Essential (primary) hypertension: Secondary | ICD-10-CM | POA: Diagnosis not present

## 2020-12-20 DIAGNOSIS — N3091 Cystitis, unspecified with hematuria: Secondary | ICD-10-CM | POA: Diagnosis not present

## 2020-12-20 DIAGNOSIS — E7849 Other hyperlipidemia: Secondary | ICD-10-CM | POA: Diagnosis not present

## 2021-01-29 DIAGNOSIS — Z452 Encounter for adjustment and management of vascular access device: Secondary | ICD-10-CM | POA: Diagnosis not present

## 2021-02-06 DIAGNOSIS — E1140 Type 2 diabetes mellitus with diabetic neuropathy, unspecified: Secondary | ICD-10-CM | POA: Diagnosis not present

## 2021-02-06 DIAGNOSIS — M79671 Pain in right foot: Secondary | ICD-10-CM | POA: Diagnosis not present

## 2021-02-06 DIAGNOSIS — M79672 Pain in left foot: Secondary | ICD-10-CM | POA: Diagnosis not present

## 2021-02-06 DIAGNOSIS — L110 Acquired keratosis follicularis: Secondary | ICD-10-CM | POA: Diagnosis not present

## 2021-03-05 DIAGNOSIS — I5041 Acute combined systolic (congestive) and diastolic (congestive) heart failure: Secondary | ICD-10-CM | POA: Diagnosis not present

## 2021-03-05 DIAGNOSIS — N3091 Cystitis, unspecified with hematuria: Secondary | ICD-10-CM | POA: Diagnosis not present

## 2021-03-05 DIAGNOSIS — Z6829 Body mass index (BMI) 29.0-29.9, adult: Secondary | ICD-10-CM | POA: Diagnosis not present

## 2021-03-05 DIAGNOSIS — K51518 Left sided colitis with other complication: Secondary | ICD-10-CM | POA: Diagnosis not present

## 2021-03-06 DIAGNOSIS — N3080 Other cystitis without hematuria: Secondary | ICD-10-CM | POA: Diagnosis not present

## 2021-03-06 DIAGNOSIS — I5041 Acute combined systolic (congestive) and diastolic (congestive) heart failure: Secondary | ICD-10-CM | POA: Diagnosis not present

## 2021-03-06 DIAGNOSIS — A0472 Enterocolitis due to Clostridium difficile, not specified as recurrent: Secondary | ICD-10-CM | POA: Diagnosis not present

## 2021-03-06 DIAGNOSIS — K51518 Left sided colitis with other complication: Secondary | ICD-10-CM | POA: Diagnosis not present

## 2021-03-06 DIAGNOSIS — N3091 Cystitis, unspecified with hematuria: Secondary | ICD-10-CM | POA: Diagnosis not present

## 2021-03-08 DIAGNOSIS — I3481 Nonrheumatic mitral (valve) annulus calcification: Secondary | ICD-10-CM | POA: Diagnosis not present

## 2021-03-08 DIAGNOSIS — I358 Other nonrheumatic aortic valve disorders: Secondary | ICD-10-CM | POA: Diagnosis not present

## 2021-03-08 DIAGNOSIS — I5041 Acute combined systolic (congestive) and diastolic (congestive) heart failure: Secondary | ICD-10-CM | POA: Diagnosis not present

## 2021-03-14 DIAGNOSIS — Z452 Encounter for adjustment and management of vascular access device: Secondary | ICD-10-CM | POA: Diagnosis not present

## 2021-03-21 ENCOUNTER — Other Ambulatory Visit: Payer: Self-pay

## 2021-03-21 ENCOUNTER — Encounter (HOSPITAL_COMMUNITY): Payer: Self-pay | Admitting: *Deleted

## 2021-03-21 ENCOUNTER — Observation Stay (HOSPITAL_COMMUNITY)
Admission: EM | Admit: 2021-03-21 | Discharge: 2021-03-22 | Disposition: A | Discharge status: Home / Self Care | Payer: Medicare Other | Attending: Family Medicine | Admitting: Family Medicine

## 2021-03-21 ENCOUNTER — Emergency Department (HOSPITAL_COMMUNITY): Payer: Medicare Other

## 2021-03-21 DIAGNOSIS — K7460 Unspecified cirrhosis of liver: Secondary | ICD-10-CM | POA: Insufficient documentation

## 2021-03-21 DIAGNOSIS — R1110 Vomiting, unspecified: Secondary | ICD-10-CM

## 2021-03-21 DIAGNOSIS — R197 Diarrhea, unspecified: Secondary | ICD-10-CM

## 2021-03-21 DIAGNOSIS — D61818 Other pancytopenia: Secondary | ICD-10-CM

## 2021-03-21 DIAGNOSIS — I4581 Long QT syndrome: Secondary | ICD-10-CM

## 2021-03-21 DIAGNOSIS — K529 Noninfective gastroenteritis and colitis, unspecified: Secondary | ICD-10-CM | POA: Diagnosis not present

## 2021-03-21 DIAGNOSIS — M7989 Other specified soft tissue disorders: Secondary | ICD-10-CM

## 2021-03-21 DIAGNOSIS — I509 Heart failure, unspecified: Secondary | ICD-10-CM | POA: Diagnosis not present

## 2021-03-21 DIAGNOSIS — J449 Chronic obstructive pulmonary disease, unspecified: Secondary | ICD-10-CM | POA: Diagnosis not present

## 2021-03-21 DIAGNOSIS — C8590 Non-Hodgkin lymphoma, unspecified, unspecified site: Secondary | ICD-10-CM | POA: Diagnosis present

## 2021-03-21 DIAGNOSIS — Z7984 Long term (current) use of oral hypoglycemic drugs: Secondary | ICD-10-CM | POA: Diagnosis not present

## 2021-03-21 DIAGNOSIS — R0602 Shortness of breath: Secondary | ICD-10-CM | POA: Diagnosis not present

## 2021-03-21 DIAGNOSIS — E119 Type 2 diabetes mellitus without complications: Secondary | ICD-10-CM | POA: Insufficient documentation

## 2021-03-21 DIAGNOSIS — R7989 Other specified abnormal findings of blood chemistry: Secondary | ICD-10-CM | POA: Diagnosis not present

## 2021-03-21 DIAGNOSIS — E8809 Other disorders of plasma-protein metabolism, not elsewhere classified: Secondary | ICD-10-CM | POA: Diagnosis present

## 2021-03-21 DIAGNOSIS — K219 Gastro-esophageal reflux disease without esophagitis: Secondary | ICD-10-CM

## 2021-03-21 DIAGNOSIS — I5033 Acute on chronic diastolic (congestive) heart failure: Secondary | ICD-10-CM | POA: Diagnosis present

## 2021-03-21 DIAGNOSIS — R609 Edema, unspecified: Secondary | ICD-10-CM | POA: Diagnosis not present

## 2021-03-21 DIAGNOSIS — J90 Pleural effusion, not elsewhere classified: Secondary | ICD-10-CM | POA: Diagnosis not present

## 2021-03-21 DIAGNOSIS — Z8571 Personal history of Hodgkin lymphoma: Secondary | ICD-10-CM | POA: Diagnosis not present

## 2021-03-21 DIAGNOSIS — E876 Hypokalemia: Secondary | ICD-10-CM

## 2021-03-21 DIAGNOSIS — Z794 Long term (current) use of insulin: Secondary | ICD-10-CM | POA: Diagnosis not present

## 2021-03-21 DIAGNOSIS — Z20822 Contact with and (suspected) exposure to covid-19: Secondary | ICD-10-CM | POA: Insufficient documentation

## 2021-03-21 DIAGNOSIS — R9431 Abnormal electrocardiogram [ECG] [EKG]: Secondary | ICD-10-CM | POA: Diagnosis present

## 2021-03-21 DIAGNOSIS — E782 Mixed hyperlipidemia: Secondary | ICD-10-CM | POA: Diagnosis present

## 2021-03-21 DIAGNOSIS — K7581 Nonalcoholic steatohepatitis (NASH): Secondary | ICD-10-CM | POA: Diagnosis present

## 2021-03-21 DIAGNOSIS — R2243 Localized swelling, mass and lump, lower limb, bilateral: Secondary | ICD-10-CM | POA: Diagnosis present

## 2021-03-21 HISTORY — DX: Type 2 diabetes mellitus without complications: E11.9

## 2021-03-21 HISTORY — DX: Unspecified cirrhosis of liver: K74.60

## 2021-03-21 HISTORY — DX: Chronic obstructive pulmonary disease, unspecified: J44.9

## 2021-03-21 LAB — TROPONIN I (HIGH SENSITIVITY)
Troponin I (High Sensitivity): 7 ng/L (ref <18)
Troponin I (High Sensitivity): 8 ng/L (ref <18)

## 2021-03-21 LAB — COMPREHENSIVE METABOLIC PANEL
ALT: 12 U/L (ref 0–44)
AST: 29 U/L (ref 15–41)
Albumin: 3.1 g/dL — ABNORMAL LOW (ref 3.5–5.0)
Alkaline Phosphatase: 77 U/L (ref 38–126)
Anion gap: 13 (ref 5–15)
BUN: 13 mg/dL (ref 8–23)
CO2: 21 mmol/L — ABNORMAL LOW (ref 22–32)
Calcium: 7.3 mg/dL — ABNORMAL LOW (ref 8.9–10.3)
Chloride: 102 mmol/L (ref 98–111)
Creatinine, Ser: 0.84 mg/dL (ref 0.44–1.00)
GFR, Estimated: >60 mL/min (ref >60)
Glucose, Bld: 124 mg/dL — ABNORMAL HIGH (ref 70–99)
Potassium: 3.2 mmol/L — ABNORMAL LOW (ref 3.5–5.1)
Sodium: 136 mmol/L (ref 135–145)
Total Bilirubin: 2.1 mg/dL — ABNORMAL HIGH (ref 0.3–1.2)
Total Protein: 5.7 g/dL — ABNORMAL LOW (ref 6.5–8.1)

## 2021-03-21 LAB — URINALYSIS, ROUTINE W REFLEX MICROSCOPIC
Bilirubin Urine: NEGATIVE
Glucose, UA: NEGATIVE mg/dL
Ketones, ur: NEGATIVE mg/dL
Nitrite: NEGATIVE
Protein, ur: NEGATIVE mg/dL
Specific Gravity, Urine: 1.009 (ref 1.005–1.030)
WBC, UA: >50 WBC/hpf — ABNORMAL HIGH (ref 0–5)
pH: 6 (ref 5.0–8.0)

## 2021-03-21 LAB — CBC WITH DIFFERENTIAL/PLATELET
Abs Immature Granulocytes: 0.02 10*3/uL (ref 0.00–0.07)
Basophils Absolute: 0 10*3/uL (ref 0.0–0.1)
Basophils Relative: 1 %
Eosinophils Absolute: 0.1 10*3/uL (ref 0.0–0.5)
Eosinophils Relative: 2 %
HCT: 27.4 % — ABNORMAL LOW (ref 36.0–46.0)
Hemoglobin: 8.4 g/dL — ABNORMAL LOW (ref 12.0–15.0)
Immature Granulocytes: 1 %
Lymphocytes Relative: 28 %
Lymphs Abs: 0.9 10*3/uL (ref 0.7–4.0)
MCH: 25.6 pg — ABNORMAL LOW (ref 26.0–34.0)
MCHC: 30.7 g/dL (ref 30.0–36.0)
MCV: 83.5 fL (ref 80.0–100.0)
Monocytes Absolute: 0.2 10*3/uL (ref 0.1–1.0)
Monocytes Relative: 7 %
Neutro Abs: 2 10*3/uL (ref 1.7–7.7)
Neutrophils Relative %: 61 %
Platelets: 70 10*3/uL — ABNORMAL LOW (ref 150–400)
RBC: 3.28 MIL/uL — ABNORMAL LOW (ref 3.87–5.11)
RDW: 18 % — ABNORMAL HIGH (ref 11.5–15.5)
WBC: 3.2 10*3/uL — ABNORMAL LOW (ref 4.0–10.5)
nRBC: 0 % (ref 0.0–0.2)

## 2021-03-21 LAB — BRAIN NATRIURETIC PEPTIDE: B Natriuretic Peptide: 144 pg/mL — ABNORMAL HIGH (ref 0.0–100.0)

## 2021-03-21 LAB — RESP PANEL BY RT-PCR (FLU A&B, COVID) ARPGX2
Influenza A by PCR: NEGATIVE
Influenza B by PCR: NEGATIVE
SARS Coronavirus 2 by RT PCR: NEGATIVE

## 2021-03-21 LAB — GLUCOSE, CAPILLARY: Glucose-Capillary: 117 mg/dL — ABNORMAL HIGH (ref 70–99)

## 2021-03-21 MED ORDER — FUROSEMIDE 10 MG/ML IJ SOLN
40.0000 mg | Freq: Once | INTRAMUSCULAR | Status: AC
  Administered 2021-03-21: 40 mg via INTRAVENOUS
  Filled 2021-03-21: qty 4

## 2021-03-21 MED ORDER — INSULIN GLARGINE-YFGN 100 UNIT/ML ~~LOC~~ SOLN
5.0000 [IU] | Freq: Every day | SUBCUTANEOUS | Status: DC
  Filled 2021-03-21: qty 0.05

## 2021-03-21 MED ORDER — SODIUM CHLORIDE 0.9 % IV SOLN
1.0000 g | Freq: Once | INTRAVENOUS | Status: AC
  Administered 2021-03-21: 1 g via INTRAVENOUS
  Filled 2021-03-21: qty 10

## 2021-03-21 MED ORDER — PANTOPRAZOLE SODIUM 40 MG PO TBEC
40.0000 mg | DELAYED_RELEASE_TABLET | Freq: Two times a day (BID) | ORAL | Status: DC
  Administered 2021-03-21 – 2021-03-22 (×2): 40 mg via ORAL
  Filled 2021-03-21 (×2): qty 1

## 2021-03-21 MED ORDER — ACETAMINOPHEN 325 MG PO TABS
650.0000 mg | ORAL_TABLET | Freq: Four times a day (QID) | ORAL | Status: DC | PRN
  Filled 2021-03-21: qty 2

## 2021-03-21 MED ORDER — GLUCERNA SHAKE PO LIQD
237.0000 mL | Freq: Three times a day (TID) | ORAL | Status: DC
  Administered 2021-03-22: 237 mL via ORAL
  Filled 2021-03-21 (×3): qty 237

## 2021-03-21 MED ORDER — PRAVASTATIN SODIUM 10 MG PO TABS
20.0000 mg | ORAL_TABLET | Freq: Every evening | ORAL | Status: DC
  Administered 2021-03-21: 20 mg via ORAL
  Filled 2021-03-21: qty 2

## 2021-03-21 MED ORDER — INSULIN ASPART 100 UNIT/ML IJ SOLN
0.0000 [IU] | Freq: Three times a day (TID) | INTRAMUSCULAR | Status: DC
  Administered 2021-03-22: 2 [IU] via SUBCUTANEOUS
  Administered 2021-03-22: 3 [IU] via SUBCUTANEOUS

## 2021-03-21 MED ORDER — POTASSIUM CHLORIDE CRYS ER 20 MEQ PO TBCR
40.0000 meq | EXTENDED_RELEASE_TABLET | Freq: Once | ORAL | Status: AC
  Administered 2021-03-21: 40 meq via ORAL
  Filled 2021-03-21: qty 2

## 2021-03-21 MED ORDER — INSULIN ASPART 100 UNIT/ML IJ SOLN: 0.0000 [IU] | Freq: Every day | INTRAMUSCULAR | Status: DC

## 2021-03-21 MED ORDER — FUROSEMIDE 10 MG/ML IJ SOLN
40.0000 mg | Freq: Two times a day (BID) | INTRAMUSCULAR | Status: DC
  Administered 2021-03-21 – 2021-03-22 (×2): 40 mg via INTRAVENOUS
  Filled 2021-03-21 (×2): qty 4

## 2021-03-21 MED ORDER — PROCHLORPERAZINE EDISYLATE 10 MG/2ML IJ SOLN: 5.0000 mg | Freq: Four times a day (QID) | INTRAMUSCULAR | Status: DC | PRN

## 2021-03-21 NOTE — ED Notes (Signed)
Patient in/out cath at this time with assistance from [place], [place]. Patient tolerated well and obtained approximately 451mL urine.

## 2021-03-21 NOTE — ED Notes (Signed)
Unable to obtain EKG - patient is in x-ray

## 2021-03-21 NOTE — ED Notes (Signed)
Patient given catheter at this time to self cath in the restroom across the hall.

## 2021-03-21 NOTE — H&P (Addendum)
History and Physical  Brooke Gutierrez IOE:[id number] DOB: [birth year] DOA: 03/21/2021  Referring physician: Rodney Booze, PA-C  PCP: Neale Burly, MD  Patient coming from: Home  Chief Complaint: Leg swelling  HPI: Brooke Gutierrez is a 86 y.o. female with medical history significant for Hodgkin disease mixed cellularity, initial clinical stage IIIB diagnosed in 2008 treated with ABVD, cirrhosis, T2DM, GERD, hyperlipidemia, esophageal varices, GERD, iron deficiency anemia, thrombocytopenia who presents to the emergency department due to 2-week onset of increasing shortness of breath and bilateral leg swelling associated with increasing weakness.  She followed up with her PCP who started her on Lasix 20 mg twice daily since last week without any improvement, she called her PCP about her worsening condition and she was asked to go to the ED for further evaluation and management. Patient also complained of 2-3 month episode of diarrhea which was watery in nature.  Patient states that she had a C. difficile assay test on 02/26/2021 which was for toxigenic C. difficile.  Last bowel movement was yesterday.  Patient endorsed to an episode of nonbloody vomiting about 4 days ago, but denies fever, chills, chest pain, nausea, abdominal pain.  ED Course:  In the emergency department, patient was hemodynamically stable, work-up in the ED showed pancytopenia, hypokalemia, BNP 144, albumin 3.1, troponin x2 was negative.  influenza A, B, SARS coronavirus 2 was negative. Chest x-ray showed possible trace bilateral pleural effusions Patient was treated with IV Lasix 40 mg x 1, potassium was replenished, IV ceftriaxone was given due to presumed UTI.  Hospitalist was asked to admit patient for further evaluation and management.  Review of Systems: A full 10 point Review of Systems was done, except as stated above, all other Review of systems were negative.  Past Medical History:  Diagnosis Date    Cirrhosis ([place])    COPD (chronic obstructive pulmonary disease) ([place])    Diabetes mellitus without complication ([place])    Past Surgical History:  Procedure Laterality Date   ABDOMINAL HYSTERECTOMY     APPENDECTOMY     BLADDER REPAIR     CESAREAN SECTION     GALLBLADDER SURGERY      Social History:  reports that she has never smoked. She has never used smokeless tobacco. She reports that she does not drink alcohol and does not use drugs.   Allergies  Allergen Reactions   Ciprofloxacin    Levaquin [Levofloxacin]    Sulfa Antibiotics    Other Rash    Adhesive tape   Family history: No significant known family history per patient   Prior to Admission medications   Medication Sig Start Date End Date Taking? Authorizing Provider  clonazePAM (KLONOPIN) 0.5 MG tablet Take 0.5 mg by mouth daily as needed. 08/23/19   [provider]  dexamethasone 0.5 MG/5ML elixir Take 1 mg by mouth 2 (two) times daily as needed. 04/12/19   [provider]  HUMALOG KWIKPEN 100 UNIT/ML KwikPen INJECT 15 UNITS SUBCUTANEOUSLY BEFORE MEAL(S) 07/12/18   [provider]  HUMALOG MIX 75/25 (75-25) 100 UNIT/ML SUSP injection INJECT 10 UNITS SUBCUTANEOUSLY BEFORE MEALS 05/06/18   [provider]  HYDROcodone-acetaminophen (NORCO/VICODIN) 5-325 MG tablet Take 1 tablet by mouth 3 (three) times daily as needed. for pain Patient not taking: Reported on 08/25/2019 04/19/19   [provider]  insulin glargine (LANTUS) 100 UNIT/ML injection Inject into the skin.    [provider]  insulin lispro (HUMALOG) 100 UNIT/ML injection Inject into the skin.  [provider]  insulin lispro protamine-lispro (HUMALOG 75/25 MIX) (75-25) 100 UNIT/ML SUSP injection Inject into the skin.    [provider]  metFORMIN (GLUCOPHAGE) 500 MG tablet Take 500 mg by mouth 2 (two) times daily. 04/25/19   [provider]  Omega-3 Fatty Acids (FISH OIL) 1000 MG CAPS Take  by mouth.    [provider]  pantoprazole (PROTONIX) 40 MG tablet  03/22/19   [provider]  pravastatin (PRAVACHOL) 20 MG tablet Take by mouth.    [provider]  propranolol (INDERAL) 10 MG tablet Take 10 mg by mouth 2 (two) times daily. 03/24/19   [provider]    Physical Exam: BP (!) 132/47    Pulse 82    Temp 97.6 F (36.4 C) (Oral)    Resp 17    Ht 5\' 3"  (1.6 m)    Wt 73 kg    SpO2 93%    BMI 28.51 kg/m   General: 86 y.o. year-old female well developed well nourished in no acute distress.  Alert and oriented x3. HEENT: Dry mucous membrane.  NCAT, EOMI Neck: Supple, trachea medial Cardiovascular: Regular rate and rhythm with no rubs or gallops.  No thyromegaly or JVD noted.  2/4 pulses in all 4 extremities. Respiratory: Mild rales in lower lobes bilaterally.  No wheezes  Abdomen: Soft, nontender nondistended with normal bowel sounds x4 quadrants. Muskuloskeletal: Port-A-Cath insertion site noted.  Right leg swelling  > left leg swelling.  Bilateral LE edema. Neuro: CN II-XII intact, strength 5/5 x 4, sensation, reflexes intact Skin: No ulcerative lesions noted or rashes Psychiatry: Mood is appropriate for condition and setting          Labs on Admission:  Basic Metabolic Panel: Recent Labs  Lab 03/21/21 1600  NA 136  K 3.2*  CL 102  CO2 21*  GLUCOSE 124*  BUN 13  CREATININE 0.84  CALCIUM 7.3*   Liver Function Tests: Recent Labs  Lab 03/21/21 1600  AST 29  ALT 12  ALKPHOS 77  BILITOT 2.1*  PROT 5.7*  ALBUMIN 3.1*   No results for input(s): LIPASE, AMYLASE in the last 168 hours. No results for input(s): AMMONIA in the last 168 hours. CBC: Recent Labs  Lab 03/21/21 1600  WBC 3.2*  NEUTROABS 2.0  HGB 8.4*  HCT 27.4*  MCV 83.5  PLT 70*   Cardiac Enzymes: No results for input(s): CKTOTAL, CKMB, CKMBINDEX, TROPONINI in the last 168 hours.  BNP (last 3 results) Recent Labs    03/21/21 1605  BNP 144.0*     ProBNP (last 3 results) No results for input(s): PROBNP in the last 8760 hours.  CBG: No results for input(s): GLUCAP in the last 168 hours.  Radiological Exams on Admission: DG Chest 2 View  Result Date: 03/21/2021 CLINICAL DATA:  Shortness of breath. Bilateral lower extremity edema. Cirrhosis. EXAM: CHEST - 2 VIEW COMPARISON:  Chest radiograph 09/19/2020 FINDINGS: Right chest port with tip in the mid SVC. The heart is normal in size. Unchanged mediastinal contours, aortic atherosclerosis. There may be a trace bilateral pleural effusions. No pulmonary edema. No focal airspace disease. No pneumothorax. No acute osseous abnormalities are seen. IMPRESSION: Possible trace bilateral pleural effusions. Electronically Signed   By: Keith Rake M.D.   On: 03/21/2021 15:06    EKG: I independently viewed the EKG done and my findings are as followed: Normal sinus rhythm at a rate of 78 bpm with QTc of 498 ms  Assessment/Plan  Present on Admission: **None**  Active Problems:   Type 2 diabetes mellitus (HCC)   Elevated brain natriuretic peptide (BNP) level   Chronic diarrhea   Vomiting   Pancytopenia (HCC)   Hypokalemia   Hypoalbuminemia   Prolonged QT interval   History of Hodgkin's disease   GERD (gastroesophageal reflux disease)   Mixed hyperlipidemia   Bilateral pleural effusion   Cirrhosis of liver (HCC)  Elevated BNP, bilateral pleural effusions rule out CHF BNP 144 Patient denies any prior history of CHF Outpatient visit echocardiogram done on 03/10/2021 showed LVEF of 65 to 70% [facility] healthcare-care everywhere) Chest x-ray showed possible trace bilateral pleural effusion Continue total input/output, daily weights and fluid restriction Continue IV Lasix 40 twice daily (as permitted by BP) Continue Cardiac diet  Echocardiogram in the morning  Consider cardiology consult based on echo findings  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 difficile assay test done was negative Consider GI stool panel if patient diarrhea resumes  Vomiting Patient only had an episode of nonbloody vomiting about 4 days ago Continue Compazine as needed  Type II DM Continue ISS and hypoglycemic protocol Continue Semglee 5 units nightly (patient uses 15 units of Lantus nightly) and adjust dose accordingly Metformin will be held at this time  GERD Continue Protonix  Mixed hyperlipidemia Continue Pravachol  History of Hodgkin's disease  DVT prophylaxis: SCDs   Code Status: Full code  Family Communication: None at bedside  Disposition Plan:  Patient is from:                        home Anticipated DC to:                   SNF or family members home Anticipated DC date:               2-3 days Anticipated DC barriers:         Patient requires inpatient management due to severity of symptoms    Consults called: None  Admission status: Inpatient    Bernadette Hoit MD [facility]  03/21/2021, 8:36 PM

## 2021-03-21 NOTE — ED Triage Notes (Signed)
Referred by PCP for evaluation of edema in legs and feet, history of cirrhosis. Concerned she may have a UTI

## 2021-03-21 NOTE — ED Provider Notes (Signed)
[facility] EMERGENCY DEPARTMENT Provider Note   CSN: [id number] Arrival date & time: 03/21/21  1317     History  Chief Complaint  Patient presents with   Edema    Brooke Gutierrez is a 86 y.o. female.  HPI   86 y/o female with a h/o NASH, COPD, DM, frequent UTIs, CHF, who presents to the ED today for eval of ble swelling, sob that started 2 week ago. She is intermittently on lasix for this and recently initiated 20 mg BID for the last week and her symptoms have not improved. Over the course of the last week she has become extremely weak to the point that she can hardly walk.   She further reports cough and NV for the last week. She has had diarrhea for months. Has tested neg for cdiff recently.   She denies chest pain, fevers.  Home Medications Prior to Admission medications   Medication Sig Start Date End Date Taking? Authorizing Provider  clonazePAM (KLONOPIN) 0.5 MG tablet Take 0.5 mg by mouth daily as needed. 08/23/19   [provider]  dexamethasone 0.5 MG/5ML elixir Take 1 mg by mouth 2 (two) times daily as needed. 04/12/19   [provider]  HUMALOG KWIKPEN 100 UNIT/ML KwikPen INJECT 15 UNITS SUBCUTANEOUSLY BEFORE MEAL(S) 07/12/18   [provider]  HUMALOG MIX 75/25 (75-25) 100 UNIT/ML SUSP injection INJECT 10 UNITS SUBCUTANEOUSLY BEFORE MEALS 05/06/18   [provider]  HYDROcodone-acetaminophen (NORCO/VICODIN) 5-325 MG tablet Take 1 tablet by mouth 3 (three) times daily as needed. for pain Patient not taking: Reported on 08/25/2019 04/19/19   [provider]  insulin glargine (LANTUS) 100 UNIT/ML injection Inject into the skin.    [provider]  insulin lispro (HUMALOG) 100 UNIT/ML injection Inject into the skin.    [provider]  insulin lispro protamine-lispro (HUMALOG 75/25 MIX) (75-25) 100 UNIT/ML SUSP injection Inject into the skin.    [provider]  metFORMIN (GLUCOPHAGE) 500 MG tablet Take 500  mg by mouth 2 (two) times daily. 04/25/19   [provider]  Omega-3 Fatty Acids (FISH OIL) 1000 MG CAPS Take by mouth.    [provider]  pantoprazole (PROTONIX) 40 MG tablet  03/22/19   [provider]  pravastatin (PRAVACHOL) 20 MG tablet Take by mouth.    [provider]  propranolol (INDERAL) 10 MG tablet Take 10 mg by mouth 2 (two) times daily. 03/24/19   [provider]      Allergies    Ciprofloxacin, Levaquin [levofloxacin], Sulfa antibiotics, and Other    Review of Systems   Review of Systems  Constitutional:  Negative for chills and fever.  HENT:  Negative for ear pain and sore throat.   Eyes:  Negative for visual disturbance.  Respiratory:  Positive for cough and shortness of breath.   Cardiovascular:  Positive for leg swelling. Negative for chest pain.  Gastrointestinal:  Positive for diarrhea, nausea and vomiting. Negative for abdominal pain.  Genitourinary:  Negative for dysuria and hematuria.  Musculoskeletal:  Negative for back pain.  Skin:  Negative for rash.  Neurological:  Positive for weakness.  All other systems reviewed and are negative.  Physical Exam Updated Vital Signs BP (!) 117/51    Pulse 83    Temp 97.6 F (36.4 C) (Oral)    Resp 19    Ht 5\' 3"  (1.6 m)    Wt 73 kg    SpO2 91%    BMI 28.51  kg/m  Physical Exam Vitals and nursing note reviewed.  Constitutional:      General: She is not in acute distress.    Appearance: She is well-developed.  HENT:     Head: Normocephalic and atraumatic.     Mouth/Throat:     Mouth: Mucous membranes are dry.  Eyes:     Conjunctiva/sclera: Conjunctivae normal.     Comments: Pale conjunctiva  Cardiovascular:     Rate and Rhythm: Normal rate and regular rhythm.     Heart sounds: Normal heart sounds. No murmur heard. Pulmonary:     Effort: Pulmonary effort is normal. No respiratory distress.     Breath sounds: Normal breath sounds. No wheezing or rales.  Abdominal:      General: Bowel sounds are normal.     Palpations: Abdomen is soft.     Tenderness: There is no abdominal tenderness. There is no guarding or rebound.  Musculoskeletal:        General: No swelling.     Cervical back: Neck supple.     Right lower leg: Edema present.     Left lower leg: Edema present.  Skin:    General: Skin is warm and dry.     Capillary Refill: Capillary refill takes less than 2 seconds.  Neurological:     Mental Status: She is alert.  Psychiatric:        Mood and Affect: Mood normal.    ED Results / Procedures / Treatments   Labs (all labs ordered are listed, but only abnormal results are displayed) Labs Reviewed  COMPREHENSIVE METABOLIC PANEL - Abnormal; Notable for the following components:      Result Value   Potassium 3.2 (*)    CO2 21 (*)    Glucose, Bld 124 (*)    Calcium 7.3 (*)    Total Protein 5.7 (*)    Albumin 3.1 (*)    Total Bilirubin 2.1 (*)    All other components within normal limits  CBC WITH DIFFERENTIAL/PLATELET - Abnormal; Notable for the following components:   WBC 3.2 (*)    RBC 3.28 (*)    Hemoglobin 8.4 (*)    HCT 27.4 (*)    MCH 25.6 (*)    RDW 18.0 (*)    Platelets 70 (*)    All other components within normal limits  BRAIN NATRIURETIC PEPTIDE - Abnormal; Notable for the following components:   B Natriuretic Peptide 144.0 (*)    All other components within normal limits  URINALYSIS, ROUTINE W REFLEX MICROSCOPIC - Abnormal; Notable for the following components:   APPearance HAZY (*)    Hgb urine dipstick SMALL (*)    Leukocytes,Ua LARGE (*)    WBC, UA >50 (*)    Bacteria, UA MANY (*)    All other components within normal limits  RESP PANEL BY RT-PCR (FLU A&B, COVID) ARPGX2  TROPONIN I (HIGH SENSITIVITY)  TROPONIN I (HIGH SENSITIVITY)    EKG None  Radiology DG Chest 2 View  Result Date: 03/21/2021 CLINICAL DATA:  Shortness of breath. Bilateral lower extremity edema. Cirrhosis. EXAM: CHEST - 2 VIEW COMPARISON:  Chest  radiograph 09/19/2020 FINDINGS: Right chest port with tip in the mid SVC. The heart is normal in size. Unchanged mediastinal contours, aortic atherosclerosis. There may be a trace bilateral pleural effusions. No pulmonary edema. No focal airspace disease. No pneumothorax. No acute osseous abnormalities are seen. IMPRESSION: Possible trace bilateral pleural effusions. Electronically Signed   By: Aurther Loft.D.  On: 03/21/2021 15:06    Procedures Procedures   7:32 PM Cardiac monitoring reveals NSR, HR 70s (Rate & rhythm), as reviewed and interpreted by me. Cardiac monitoring was ordered due to weakness and to monitor patient for dysrhythmia.   Medications Ordered in ED Medications  furosemide (LASIX) injection 40 mg (40 mg Intravenous Given 03/21/21 1730)  potassium chloride SA (KLOR-CON M) CR tablet 40 mEq (40 mEq Oral Given 03/21/21 1730)  cefTRIAXone (ROCEPHIN) 1 g in sodium chloride 0.9 % 100 mL IVPB (0 g Intravenous Stopped 03/21/21 1858)    ED Course/ Medical Decision Making/ A&P                           Medical Decision Making  86 y/o female presents for eval of ble edema, sob, and generalized weakness  Reviewed/interpreted labs CBC with mild leukopenia, anemia, thrombocytopenia CMP with mild hypokalemia, slightly low bicarb, BUN/creatinine are within normal limits.  She has mildly low calcium, total protein and albumin.  Bilirubin is markedly elevated at 2.1.    - Potassium was replenished in the ED. Trop neg x2 BNP is marginally elevated at 1444  EKG - with NSR, low voltage in the precordial leads, borderline prolonged CT, baseline wander leads in II, III aVF  Reviewed/interpreted imaging CXR - Possible trace bilateral pleural effusions.  Patient presenting for evaluation of bilateral lower extremity edema and generalized weakness.  Also shortness of breath. This is likely multifactorial in setting of CHF and hx cirrhosis.  She has initiated Lasix at home with no  improvement of her symptoms.  She continues to appear fluid overloaded today.  She was given a dose of IV Lasix.  Additionally she appears to have a urinary tract infection based on her urine.  She does intermittently self cath at home so she was given a dose of antibiotics here in the ED to treat this as well.  7:27 PM CONSULT with Dr. Josephine Cables with hospitalist service who accepts patient for admission   Final Clinical Impression(s) / ED Diagnoses Final diagnoses:  Acute on chronic congestive heart failure, unspecified heart failure type [facility])    Rx / DC Orders ED Discharge Orders     None         Bishop Dublin 03/21/21 1932    Isla Pence, MD 03/23/21 [phone]

## 2021-03-22 ENCOUNTER — Inpatient Hospital Stay (HOSPITAL_COMMUNITY): Payer: Medicare Other

## 2021-03-22 ENCOUNTER — Other Ambulatory Visit (HOSPITAL_COMMUNITY): Payer: Medicare Other

## 2021-03-22 DIAGNOSIS — K7460 Unspecified cirrhosis of liver: Secondary | ICD-10-CM

## 2021-03-22 DIAGNOSIS — K219 Gastro-esophageal reflux disease without esophagitis: Secondary | ICD-10-CM

## 2021-03-22 DIAGNOSIS — Z8571 Personal history of Hodgkin lymphoma: Secondary | ICD-10-CM

## 2021-03-22 DIAGNOSIS — E876 Hypokalemia: Secondary | ICD-10-CM

## 2021-03-22 DIAGNOSIS — I5033 Acute on chronic diastolic (congestive) heart failure: Secondary | ICD-10-CM | POA: Diagnosis present

## 2021-03-22 DIAGNOSIS — E8809 Other disorders of plasma-protein metabolism, not elsewhere classified: Secondary | ICD-10-CM

## 2021-03-22 DIAGNOSIS — J90 Pleural effusion, not elsewhere classified: Secondary | ICD-10-CM | POA: Diagnosis not present

## 2021-03-22 DIAGNOSIS — K529 Noninfective gastroenteritis and colitis, unspecified: Secondary | ICD-10-CM

## 2021-03-22 DIAGNOSIS — R7989 Other specified abnormal findings of blood chemistry: Secondary | ICD-10-CM | POA: Diagnosis not present

## 2021-03-22 DIAGNOSIS — E782 Mixed hyperlipidemia: Secondary | ICD-10-CM

## 2021-03-22 DIAGNOSIS — I509 Heart failure, unspecified: Secondary | ICD-10-CM | POA: Insufficient documentation

## 2021-03-22 DIAGNOSIS — C8590 Non-Hodgkin lymphoma, unspecified, unspecified site: Secondary | ICD-10-CM | POA: Diagnosis present

## 2021-03-22 DIAGNOSIS — K7469 Other cirrhosis of liver: Secondary | ICD-10-CM | POA: Diagnosis present

## 2021-03-22 LAB — HEMOGLOBIN A1C
Hgb A1c MFr Bld: 5.6 % (ref 4.8–5.6)
Mean Plasma Glucose: 114.02 mg/dL

## 2021-03-22 LAB — COMPREHENSIVE METABOLIC PANEL
ALT: 11 U/L (ref 0–44)
AST: 23 U/L (ref 15–41)
Albumin: 3 g/dL — ABNORMAL LOW (ref 3.5–5.0)
Alkaline Phosphatase: 65 U/L (ref 38–126)
Anion gap: 12 (ref 5–15)
BUN: 12 mg/dL (ref 8–23)
CO2: 28 mmol/L (ref 22–32)
Calcium: 7.7 mg/dL — ABNORMAL LOW (ref 8.9–10.3)
Chloride: 102 mmol/L (ref 98–111)
Creatinine, Ser: 0.94 mg/dL (ref 0.44–1.00)
GFR, Estimated: >60 mL/min (ref >60)
Glucose, Bld: 135 mg/dL — ABNORMAL HIGH (ref 70–99)
Potassium: 3.1 mmol/L — ABNORMAL LOW (ref 3.5–5.1)
Sodium: 142 mmol/L (ref 135–145)
Total Bilirubin: 2.2 mg/dL — ABNORMAL HIGH (ref 0.3–1.2)
Total Protein: 5.7 g/dL — ABNORMAL LOW (ref 6.5–8.1)

## 2021-03-22 LAB — CBC
HCT: 27.8 % — ABNORMAL LOW (ref 36.0–46.0)
Hemoglobin: 8.9 g/dL — ABNORMAL LOW (ref 12.0–15.0)
MCH: 26.2 pg (ref 26.0–34.0)
MCHC: 32 g/dL (ref 30.0–36.0)
MCV: 81.8 fL (ref 80.0–100.0)
Platelets: 89 10*3/uL — ABNORMAL LOW (ref 150–400)
RBC: 3.4 MIL/uL — ABNORMAL LOW (ref 3.87–5.11)
RDW: 18 % — ABNORMAL HIGH (ref 11.5–15.5)
WBC: 4.2 10*3/uL (ref 4.0–10.5)
nRBC: 0 % (ref 0.0–0.2)

## 2021-03-22 LAB — GLUCOSE, CAPILLARY
Glucose-Capillary: 138 mg/dL — ABNORMAL HIGH (ref 70–99)
Glucose-Capillary: 184 mg/dL — ABNORMAL HIGH (ref 70–99)

## 2021-03-22 LAB — PHOSPHORUS: Phosphorus: 3.6 mg/dL (ref 2.5–4.6)

## 2021-03-22 LAB — MAGNESIUM: Magnesium: 0.7 mg/dL — CL (ref 1.7–2.4)

## 2021-03-22 MED ORDER — POTASSIUM CHLORIDE CRYS ER 20 MEQ PO TBCR
40.0000 meq | EXTENDED_RELEASE_TABLET | Freq: Once | ORAL | Status: AC
  Administered 2021-03-22: 40 meq via ORAL
  Filled 2021-03-22: qty 2

## 2021-03-22 MED ORDER — POTASSIUM CHLORIDE 10 MEQ/100ML IV SOLN
10.0000 meq | INTRAVENOUS | Status: AC
  Administered 2021-03-22 (×2): 10 meq via INTRAVENOUS
  Filled 2021-03-22 (×2): qty 100

## 2021-03-22 MED ORDER — FUROSEMIDE 20 MG PO TABS: 20.0000 mg | ORAL_TABLET | ORAL | 1 refills | Status: DC

## 2021-03-22 MED ORDER — CARVEDILOL 3.125 MG PO TABS: 3.1250 mg | ORAL_TABLET | Freq: Two times a day (BID) | ORAL | 1 refills | Status: DC

## 2021-03-22 MED ORDER — MAGNESIUM SULFATE 4 GM/100ML IV SOLN
4.0000 g | Freq: Once | INTRAVENOUS | Status: AC
Start: 2021-03-22 — End: 2021-03-22
  Administered 2021-03-22: 4 g via INTRAVENOUS
  Filled 2021-03-22: qty 100

## 2021-03-22 MED ORDER — POTASSIUM CHLORIDE ER 10 MEQ PO TBCR: 10.0000 meq | EXTENDED_RELEASE_TABLET | Freq: Every day | ORAL | 2 refills | Status: DC

## 2021-03-22 MED ORDER — SPIRONOLACTONE 25 MG PO TABS: 12.5000 mg | ORAL_TABLET | Freq: Every day | ORAL | 4 refills | Status: DC

## 2021-03-22 NOTE — Care Management CC44 (Signed)
Condition Code 44 Documentation Completed  Patient Details  Name: Brooke Gutierrez MRN: [id number] Date of Birth: [birth year]   Condition Code 44 given:  Yes Patient signature on Condition Code 44 notice:  Yes Documentation of 2 MD's agreement:  Yes Code 44 added to claim:  Yes    Shade Flood, LCSW 03/22/2021, 1:08 PM

## 2021-03-22 NOTE — Care Management Obs Status (Signed)
[place] NOTIFICATION   Patient Details  Name: Brooke Gutierrez MRN: [id number] Date of Birth: 01-04-1936   Medicare Observation Status Notification Given:  Yes    Shade Flood, LCSW 03/22/2021, 1:08 PM

## 2021-03-22 NOTE — TOC Progression Note (Signed)
°  Transition of Care [facility]) Screening Note   Patient Details  Name: Brooke Gutierrez Date of Birth: [birth year]   Transition of Care [facility]) CM/SW Contact:    Shade Flood, LCSW Phone Number: 03/22/2021, 10:31 AM    Transition of Care Department [facility]) has reviewed patient and no TOC needs have been identified at this time. We will continue to monitor patient advancement through interdisciplinary progression rounds. If new patient transition needs arise, please place a TOC consult.

## 2021-03-22 NOTE — Progress Notes (Signed)
Patient alert and oriented. Patient ambulated to bathroom with walker and one assist. Patient verbalizes understanding of US duplex today. Daughter has called to check on patient. Patient voices no complaints at this time.

## 2021-03-22 NOTE — Discharge Summary (Signed)
Brooke Gutierrez, is a 86 y.o. female  DOB 05/22/1935  MRN [id number].  Admission date:  03/21/2021  Admitting Physician  Bernadette Hoit, DO  Discharge Date:  03/22/2021   Primary MD  Neale Burly, MD  Recommendations for primary care physician for things to follow:   1)Very low-salt diet advised-- 2)Weigh yourself daily, call if you gain more than 3 pounds in 1 day or more than 5 pounds in 1 week as your diuretic medications may need to be adjusted 3)Limit your Fluid  intake to no more than 60 ounces (1.8 Liters) per day 4)Please Take Lasix/Furosemide , Aldactone/Spironolactone 12.5 mg daily  5)Repeat BMP and serum Magnesium on Monday 03/25/21   Admission Diagnosis  Acute exacerbation of CHF (congestive heart failure) ([place]) [I50.9] Right leg swelling [M79.89] Acute on chronic congestive heart failure, unspecified heart failure type ([place]) [I50.9]  Discharge Diagnosis  Acute exacerbation of CHF (congestive heart failure) ([place]) [I50.9] Right leg swelling [M79.89] Acute on chronic congestive heart failure, unspecified heart failure type ([place]) [I50.9]    Principal Problem:   Acute on chronic heart failure with preserved ejection fraction (HFpEF) ([place]) Active Problems:   Hypokalemia/Hypomagnesemia-due to Diarrhea and Diuretics   Liver cirrhosis secondary to NASH (nonalcoholic steatohepatitis) (HCC)   Hypomagnesemia and Hypokalemia-due to Diarrhea and Diuretics   Non-Hodgkin lymphoma in remission --2008   Type 2 diabetes mellitus (HCC)   Elevated brain natriuretic peptide (BNP) level   Chronic diarrhea   Vomiting   Pancytopenia (HCC)   Hypoalbuminemia   Prolonged QT interval   GERD (gastroesophageal reflux disease)   Mixed hyperlipidemia   Bilateral pleural effusion   Right leg swelling      Past Medical History:  Diagnosis Date   Cirrhosis ([place])    COPD (chronic obstructive pulmonary  disease) ([place])    Diabetes mellitus without complication ([place])     Past Surgical History:  Procedure Laterality Date   ABDOMINAL HYSTERECTOMY     APPENDECTOMY     BLADDER REPAIR     CESAREAN SECTION     GALLBLADDER SURGERY        HPI  from the history and physical done on the day of admission:    Chief Complaint: Leg swelling   HPI: Brooke Gutierrez is a 86 y.o. female with medical history significant for Hodgkin disease mixed cellularity, initial clinical stage IIIB diagnosed in 2008 treated with ABVD, cirrhosis, T2DM, GERD, hyperlipidemia, esophageal varices, GERD, iron deficiency anemia, thrombocytopenia who presents to the emergency department due to 2-week onset of increasing shortness of breath and bilateral leg swelling associated with increasing weakness.  She followed up with her PCP who started her on Lasix 20 mg twice daily since last week without any improvement, she called her PCP about her worsening condition and she was asked to go to the ED for further evaluation and management. Patient also complained of 2-3 month episode of diarrhea which was watery in nature.  Patient states that she had a C. difficile assay test on 02/26/2021  which was for toxigenic C. difficile.  Last bowel movement was yesterday.  Patient endorsed to an episode of nonbloody vomiting about 4 days ago, but denies fever, chills, chest pain, nausea, abdominal pain.   ED Course:  In the emergency department, patient was hemodynamically stable, work-up in the ED showed pancytopenia, hypokalemia, BNP 144, albumin 3.1, troponin x2 was negative.  influenza A, B, SARS coronavirus 2 was negative. Chest x-ray showed possible trace bilateral pleural effusions Patient was treated with IV Lasix 40 mg x 1, potassium was replenished, IV ceftriaxone was given due to presumed UTI.  Hospitalist was asked to admit patient for further evaluation and management.     Hospital Course:       A/p Acute on chronic  diastolic dysfunction CHF with bilateral pleural effusions and elevated BNP --- Echo from March 08, 2021 in [place] from [place] shows EF is 65 to 70%, no aortic stenosis, no atrial enlargement and no mitral stenosis -Diuresed well with IV Lasix -Okay to discharge home on p.o. Lasix, Aldactone and Coreg   Cirrhosis of the liver due to NASH Stable -Lasix and Aldactone as above  Pancytopenia Possibly due to cirrhosis of the liver -No bleeding concerns  Hypokalemia--due to diuretics and GI losses -Replaced   Right leg swelling Right lower extremity venous Dopplers without DVT, improved with diuresis   Hypoalbuminemia possibly secondary to mild protein calorie malnutrition Albumin 3.1, --nutritional supplements advised  Chronic diarrhea Patient has not had any diarrhea movement since yesterday Recent C. difficile assay test done was negative   Type II DM--A1c is 5.6, reflecting excellent diabetic control PTA -Continue metformin, Lantus insulin and lispro insulin  GERD Continue Protonix  Mixed hyperlipidemia Continue Pravachol   History of Hodgkin's disease--outpatient follow-up for surveillance    Code Status: Full code  Discharge Condition: stable  Follow UP   Follow-up Information     Neale Burly, MD Follow up on 03/25/2021.   Specialty: Internal Medicine Why: Repeat BMP and serum magnesium--- Contact information: [place] [address] [phone]                Diet and Activity recommendation:  As advised  Discharge Instructions   Discharge Instructions     Call MD for:  difficulty breathing, headache or visual disturbances   Complete by: As directed    Call MD for:  persistant dizziness or light-headedness   Complete by: As directed    Call MD for:  persistant nausea and vomiting   Complete by: As directed    Call MD for:  temperature >100.4   Complete by: As directed    Diet - low sodium heart  healthy   Complete by: As directed    Discharge instructions   Complete by: As directed    1)Very low-salt diet advised-- 2)Weigh yourself daily, call if you gain more than 3 pounds in 1 day or more than 5 pounds in 1 week as your diuretic medications may need to be adjusted 3)Limit your Fluid  intake to no more than 60 ounces (1.8 Liters) per day 4)Please Take Lasix/Furosemide , Aldactone/Spironolactone 12.5 mg daily  5)Repeat BMP and serum Magnesium on Monday 03/25/21   Increase activity slowly   Complete by: As directed          Discharge Medications     Allergies as of 03/22/2021       Reactions   Ciprofloxacin    Levaquin [levofloxacin]    Sulfa  Antibiotics    Other Rash   Adhesive tape        Medication List     STOP taking these medications    dexamethasone 0.5 MG/5ML elixir       TAKE these medications    carvedilol 3.125 MG tablet Commonly known as: COREG Take 1 tablet (3.125 mg total) by mouth 2 (two) times daily. What changed:  medication strength how much to take   clonazePAM 0.5 MG tablet Commonly known as: KLONOPIN Take 0.5 mg by mouth daily as needed.   furosemide 20 MG tablet Commonly known as: LASIX Take 1 tablet (20 mg total) by mouth every Monday, Wednesday, and Friday. What changed: when to take this   insulin glargine 100 UNIT/ML injection Commonly known as: LANTUS Inject 15 Units into the skin at bedtime. Sliding scale   insulin lispro 100 UNIT/ML injection Commonly known as: HUMALOG Inject 10-20 Units into the skin 3 (three) times daily with meals. Sliding scale   metFORMIN 500 MG tablet Commonly known as: GLUCOPHAGE Take 500 mg by mouth 2 (two) times daily.   pantoprazole 40 MG tablet Commonly known as: PROTONIX Take 40 mg by mouth 2 (two) times daily.   potassium chloride 10 MEQ tablet Commonly known as: KLOR-CON Take 1 tablet (10 mEq total) by mouth daily. Take While taking Lasix/furosemide   pravastatin 20 MG  tablet Commonly known as: PRAVACHOL Take 20 mg by mouth every evening.   spironolactone 25 MG tablet Commonly known as: Aldactone Take 0.5 tablets (12.5 mg total) by mouth daily.        Major procedures and Radiology Reports - PLEASE review detailed and final reports for all details, in brief -  DG Chest 2 View  Result Date: 03/21/2021 CLINICAL DATA:  Shortness of breath. Bilateral lower extremity edema. Cirrhosis. EXAM: CHEST - 2 VIEW COMPARISON:  Chest radiograph 09/19/2020 FINDINGS: Right chest port with tip in the mid SVC. The heart is normal in size. Unchanged mediastinal contours, aortic atherosclerosis. There may be a trace bilateral pleural effusions. No pulmonary edema. No focal airspace disease. No pneumothorax. No acute osseous abnormalities are seen. IMPRESSION: Possible trace bilateral pleural effusions. Electronically Signed   By: Keith Rake M.D.   On: 03/21/2021 15:06   US Venous Img Lower Unilateral Right (DVT)  Result Date: 03/22/2021 CLINICAL DATA:  RIGHT lower extremity swelling EXAM: RIGHT LOWER EXTREMITY VENOUS DOPPLER ULTRASOUND TECHNIQUE: Gray-scale sonography with compression, as well as color and duplex ultrasound, were performed to evaluate the deep venous system(s) from the level of the common femoral vein through the popliteal and proximal calf veins. COMPARISON:  None. FINDINGS: VENOUS Normal compressibility of the common femoral, superficial femoral, and popliteal veins, as well as the visualized calf veins. Visualized portions of profunda femoral vein and great saphenous vein unremarkable. No filling defects to suggest DVT on grayscale or color Doppler imaging. Doppler waveforms show normal direction of venous flow, normal respiratory plasticity and response to augmentation. Limited views of the contralateral common femoral vein are unremarkable. OTHER None. Limitations: none IMPRESSION: No evidence of RIGHT lower extremity deep venous thrombosis.  Electronically Signed   By: Suzy Bouchard M.D.   On: 03/22/2021 12:44    Micro Results  Recent Results (from the past 240 hour(s))  Resp Panel by RT-PCR (Flu A&B, Covid) Nasopharyngeal Swab     Status: None   Collection Time: 03/21/21  3:39 PM   Specimen: Nasopharyngeal Swab; Nasopharyngeal(NP) swabs in vial transport medium  Result Value Ref  Range Status   SARS Coronavirus 2 by RT PCR NEGATIVE NEGATIVE Final    Comment: (NOTE) SARS-CoV-2 target nucleic acids are NOT DETECTED.  The SARS-CoV-2 RNA is generally detectable in upper respiratory specimens during the acute phase of infection. The lowest concentration of SARS-CoV-2 viral copies this assay can detect is 138 copies/mL. A negative result does not preclude SARS-Cov-2 infection and should not be used as the sole basis for treatment or other patient management decisions. A negative result may occur with  improper specimen collection/handling, submission of specimen other than nasopharyngeal swab, presence of viral mutation(s) within the areas targeted by this assay, and inadequate number of viral copies(<138 copies/mL). A negative result must be combined with clinical observations, patient history, and epidemiological information. The expected result is Negative.  Fact Sheet for Patients:  EntrepreneurPulse.com.au  Fact Sheet for Healthcare Providers:  IncredibleEmployment.be  This test is no t yet approved or cleared by the [place] FDA and  has been authorized for detection and/or diagnosis of SARS-CoV-2 by FDA under an Emergency Use Authorization (EUA). This EUA will remain  in effect (meaning this test can be used) for the duration of the COVID-19 declaration under Section 564(b)(1) of the Act, 21 U.S.C.section 360bbb-3(b)(1), unless the authorization is terminated  or revoked sooner.       Influenza A by PCR NEGATIVE NEGATIVE Final   Influenza B by PCR NEGATIVE NEGATIVE  Final    Comment: (NOTE) The Xpert Xpress SARS-CoV-2/FLU/RSV plus assay is intended as an aid in the diagnosis of influenza from Nasopharyngeal swab specimens and should not be used as a sole basis for treatment. Nasal washings and aspirates are unacceptable for Xpert Xpress SARS-CoV-2/FLU/RSV testing.  Fact Sheet for Patients: EntrepreneurPulse.com.au  Fact Sheet for Healthcare Providers: IncredibleEmployment.be  This test is not yet approved or cleared by the [place] FDA and has been authorized for detection and/or diagnosis of SARS-CoV-2 by FDA under an Emergency Use Authorization (EUA). This EUA will remain in effect (meaning this test can be used) for the duration of the COVID-19 declaration under Section 564(b)(1) of the Act, 21 U.S.C. section 360bbb-3(b)(1), unless the authorization is terminated or revoked.  Performed at [facility], [address], [place],  [address]        Today   Subjective    Brooke Gutierrez today has no new complaints  No fever  Or chills   No Nausea, Vomiting or Diarrhea -No dyspnea on exertion, no chest pains        Patient has been seen and examined prior to discharge   Objective   Blood pressure (!) 111/49, pulse 79, temperature 97.7 F (36.5 C), temperature source Oral, resp. rate 16, height 5\' 3"  (1.6 m), weight 66 kg, SpO2 96 %.   Intake/Output Summary (Last 24 hours) at 03/22/2021 1408 Last data filed at 03/22/2021 1152 Gross per 24 hour  Intake 340 ml  Output 2450 ml  Net -2110 ml    Exam Gen:- Awake Alert, no acute distress  HEENT:- .AT, No sclera icterus Neck-Supple Neck,No JVD,.  Lungs-  CTAB , good air movement bilaterally  CV- S1, S2 normal, regular Abd-  +ve B.Sounds, Abd Soft, No tenderness,    Extremity/Skin:-Much improved over extremity edema especially right lower extremity edema,   good pulses Psych-affect is appropriate, oriented x3 Neuro-no new focal  deficits, no tremors    Data Review   CBC w Diff:  Lab Results  Component Value Date   WBC 4.2 03/22/2021  HGB 8.9 (L) 03/22/2021   HCT 27.8 (L) 03/22/2021   PLT 89 (L) 03/22/2021   LYMPHOPCT 28 03/21/2021   MONOPCT 7 03/21/2021   EOSPCT 2 03/21/2021   BASOPCT 1 03/21/2021    CMP:  Lab Results  Component Value Date   NA 142 03/22/2021   K 3.1 (L) 03/22/2021   CL 102 03/22/2021   CO2 28 03/22/2021   BUN 12 03/22/2021   CREATININE 0.94 03/22/2021   PROT 5.7 (L) 03/22/2021   ALBUMIN 3.0 (L) 03/22/2021   BILITOT 2.2 (H) 03/22/2021   ALKPHOS 65 03/22/2021   AST 23 03/22/2021   ALT 11 03/22/2021  .   Total Discharge time is about 33 minutes  Roxan Hockey M.D on 03/22/2021 at 2:08 PM  Go to [email or web address] -  for contact info  [facility] - Office  [phone]

## 2021-03-22 NOTE — Discharge Instructions (Signed)
1)Very low-salt diet advised-- 2)Weigh yourself daily, call if you gain more than 3 pounds in 1 day or more than 5 pounds in 1 week as your diuretic medications may need to be adjusted 3)Limit your Fluid  intake to no more than 60 ounces (1.8 Liters) per day 4)Please Take Lasix/Furosemide , Aldactone/Spironolactone 12.5 mg daily  5)Repeat BMP and serum Magnesium on Monday 03/25/21

## 2021-03-22 NOTE — Progress Notes (Signed)
Date and time results received: 03/22/21  0618 Test: Magnesium  Critical Value: 0.7  Name of Provider Notified: Dr. Josephine Cables  Orders Received? Or Actions Taken?: Orders Received - See Orders for details  New orders given for Magnesium 4G IV, Potassium 23mEq po once and Potassium 31mEq via IV x's 2.

## 2021-03-25 DIAGNOSIS — E876 Hypokalemia: Secondary | ICD-10-CM | POA: Diagnosis not present

## 2021-03-25 DIAGNOSIS — I5041 Acute combined systolic (congestive) and diastolic (congestive) heart failure: Secondary | ICD-10-CM | POA: Diagnosis not present

## 2021-03-25 DIAGNOSIS — Z6829 Body mass index (BMI) 29.0-29.9, adult: Secondary | ICD-10-CM | POA: Diagnosis not present

## 2021-04-02 DIAGNOSIS — R339 Retention of urine, unspecified: Secondary | ICD-10-CM | POA: Diagnosis not present

## 2021-04-25 DIAGNOSIS — E876 Hypokalemia: Secondary | ICD-10-CM | POA: Diagnosis not present

## 2021-04-25 DIAGNOSIS — Z6829 Body mass index (BMI) 29.0-29.9, adult: Secondary | ICD-10-CM | POA: Diagnosis not present

## 2021-04-25 DIAGNOSIS — Z452 Encounter for adjustment and management of vascular access device: Secondary | ICD-10-CM | POA: Diagnosis not present

## 2021-04-25 DIAGNOSIS — I5041 Acute combined systolic (congestive) and diastolic (congestive) heart failure: Secondary | ICD-10-CM | POA: Diagnosis not present

## 2021-05-01 DIAGNOSIS — E1140 Type 2 diabetes mellitus with diabetic neuropathy, unspecified: Secondary | ICD-10-CM | POA: Diagnosis not present

## 2021-05-01 DIAGNOSIS — M79672 Pain in left foot: Secondary | ICD-10-CM | POA: Diagnosis not present

## 2021-05-01 DIAGNOSIS — M79671 Pain in right foot: Secondary | ICD-10-CM | POA: Diagnosis not present

## 2021-05-01 DIAGNOSIS — L110 Acquired keratosis follicularis: Secondary | ICD-10-CM | POA: Diagnosis not present

## 2021-05-02 DIAGNOSIS — R339 Retention of urine, unspecified: Secondary | ICD-10-CM | POA: Diagnosis not present

## 2021-06-03 DIAGNOSIS — R339 Retention of urine, unspecified: Secondary | ICD-10-CM | POA: Diagnosis not present

## 2021-06-06 DIAGNOSIS — Z452 Encounter for adjustment and management of vascular access device: Secondary | ICD-10-CM | POA: Diagnosis not present

## 2021-07-03 DIAGNOSIS — R339 Retention of urine, unspecified: Secondary | ICD-10-CM | POA: Diagnosis not present

## 2021-07-10 DIAGNOSIS — M79671 Pain in right foot: Secondary | ICD-10-CM | POA: Diagnosis not present

## 2021-07-10 DIAGNOSIS — E1140 Type 2 diabetes mellitus with diabetic neuropathy, unspecified: Secondary | ICD-10-CM | POA: Diagnosis not present

## 2021-07-10 DIAGNOSIS — L110 Acquired keratosis follicularis: Secondary | ICD-10-CM | POA: Diagnosis not present

## 2021-07-10 DIAGNOSIS — M79672 Pain in left foot: Secondary | ICD-10-CM | POA: Diagnosis not present

## 2021-07-18 DIAGNOSIS — Z452 Encounter for adjustment and management of vascular access device: Secondary | ICD-10-CM | POA: Diagnosis not present

## 2021-07-23 DIAGNOSIS — I10 Essential (primary) hypertension: Secondary | ICD-10-CM | POA: Diagnosis not present

## 2021-07-23 DIAGNOSIS — E1169 Type 2 diabetes mellitus with other specified complication: Secondary | ICD-10-CM | POA: Diagnosis not present

## 2021-07-23 DIAGNOSIS — Z0000 Encounter for general adult medical examination without abnormal findings: Secondary | ICD-10-CM | POA: Diagnosis not present

## 2021-07-23 DIAGNOSIS — E7849 Other hyperlipidemia: Secondary | ICD-10-CM | POA: Diagnosis not present

## 2021-07-23 DIAGNOSIS — I5032 Chronic diastolic (congestive) heart failure: Secondary | ICD-10-CM | POA: Diagnosis not present

## 2021-08-02 DIAGNOSIS — R339 Retention of urine, unspecified: Secondary | ICD-10-CM | POA: Diagnosis not present

## 2021-08-20 DIAGNOSIS — R5383 Other fatigue: Secondary | ICD-10-CM | POA: Diagnosis not present

## 2021-08-20 DIAGNOSIS — Z0000 Encounter for general adult medical examination without abnormal findings: Secondary | ICD-10-CM | POA: Diagnosis not present

## 2021-08-20 DIAGNOSIS — I5032 Chronic diastolic (congestive) heart failure: Secondary | ICD-10-CM | POA: Diagnosis not present

## 2021-08-20 DIAGNOSIS — N3000 Acute cystitis without hematuria: Secondary | ICD-10-CM | POA: Diagnosis not present

## 2021-08-20 DIAGNOSIS — Z6831 Body mass index (BMI) 31.0-31.9, adult: Secondary | ICD-10-CM | POA: Diagnosis not present

## 2021-08-29 DIAGNOSIS — Z452 Encounter for adjustment and management of vascular access device: Secondary | ICD-10-CM | POA: Diagnosis not present

## 2021-09-03 DIAGNOSIS — R339 Retention of urine, unspecified: Secondary | ICD-10-CM | POA: Diagnosis not present

## 2021-09-09 DIAGNOSIS — K7581 Nonalcoholic steatohepatitis (NASH): Secondary | ICD-10-CM | POA: Diagnosis not present

## 2021-09-09 DIAGNOSIS — Z7984 Long term (current) use of oral hypoglycemic drugs: Secondary | ICD-10-CM | POA: Diagnosis not present

## 2021-09-09 DIAGNOSIS — Z823 Family history of stroke: Secondary | ICD-10-CM | POA: Diagnosis not present

## 2021-09-09 DIAGNOSIS — R188 Other ascites: Secondary | ICD-10-CM | POA: Diagnosis not present

## 2021-09-09 DIAGNOSIS — E876 Hypokalemia: Secondary | ICD-10-CM | POA: Diagnosis not present

## 2021-09-09 DIAGNOSIS — Z882 Allergy status to sulfonamides status: Secondary | ICD-10-CM | POA: Diagnosis not present

## 2021-09-09 DIAGNOSIS — Z609 Problem related to social environment, unspecified: Secondary | ICD-10-CM | POA: Diagnosis not present

## 2021-09-09 DIAGNOSIS — Z881 Allergy status to other antibiotic agents status: Secondary | ICD-10-CM | POA: Diagnosis not present

## 2021-09-09 DIAGNOSIS — Z79899 Other long term (current) drug therapy: Secondary | ICD-10-CM | POA: Diagnosis not present

## 2021-09-09 DIAGNOSIS — R140 Abdominal distension (gaseous): Secondary | ICD-10-CM | POA: Diagnosis not present

## 2021-09-09 DIAGNOSIS — N390 Urinary tract infection, site not specified: Secondary | ICD-10-CM | POA: Diagnosis not present

## 2021-09-09 DIAGNOSIS — I509 Heart failure, unspecified: Secondary | ICD-10-CM | POA: Diagnosis not present

## 2021-09-09 DIAGNOSIS — R0902 Hypoxemia: Secondary | ICD-10-CM | POA: Diagnosis not present

## 2021-09-09 DIAGNOSIS — Z794 Long term (current) use of insulin: Secondary | ICD-10-CM | POA: Diagnosis not present

## 2021-09-09 DIAGNOSIS — K7469 Other cirrhosis of liver: Secondary | ICD-10-CM | POA: Diagnosis not present

## 2021-09-09 DIAGNOSIS — E119 Type 2 diabetes mellitus without complications: Secondary | ICD-10-CM | POA: Diagnosis not present

## 2021-09-09 DIAGNOSIS — R0602 Shortness of breath: Secondary | ICD-10-CM | POA: Diagnosis not present

## 2021-09-10 DIAGNOSIS — K7460 Unspecified cirrhosis of liver: Secondary | ICD-10-CM | POA: Diagnosis not present

## 2021-09-10 DIAGNOSIS — R0600 Dyspnea, unspecified: Secondary | ICD-10-CM | POA: Diagnosis not present

## 2021-09-10 DIAGNOSIS — Z79899 Other long term (current) drug therapy: Secondary | ICD-10-CM | POA: Diagnosis not present

## 2021-09-10 DIAGNOSIS — R600 Localized edema: Secondary | ICD-10-CM | POA: Diagnosis not present

## 2021-09-19 ENCOUNTER — Other Ambulatory Visit: Payer: Self-pay

## 2021-09-19 ENCOUNTER — Observation Stay (HOSPITAL_COMMUNITY)
Admission: EM | Admit: 2021-09-19 | Discharge: 2021-09-21 | Disposition: A | Discharge status: Home / Self Care | Payer: Medicare Other | Attending: Internal Medicine | Admitting: Internal Medicine

## 2021-09-19 ENCOUNTER — Encounter (HOSPITAL_COMMUNITY): Payer: Self-pay

## 2021-09-19 ENCOUNTER — Emergency Department (HOSPITAL_COMMUNITY): Payer: Medicare Other

## 2021-09-19 DIAGNOSIS — E782 Mixed hyperlipidemia: Secondary | ICD-10-CM | POA: Diagnosis not present

## 2021-09-19 DIAGNOSIS — E876 Hypokalemia: Secondary | ICD-10-CM | POA: Diagnosis present

## 2021-09-19 DIAGNOSIS — K7581 Nonalcoholic steatohepatitis (NASH): Secondary | ICD-10-CM | POA: Diagnosis not present

## 2021-09-19 DIAGNOSIS — J811 Chronic pulmonary edema: Secondary | ICD-10-CM | POA: Diagnosis not present

## 2021-09-19 DIAGNOSIS — E119 Type 2 diabetes mellitus without complications: Secondary | ICD-10-CM

## 2021-09-19 DIAGNOSIS — R7989 Other specified abnormal findings of blood chemistry: Secondary | ICD-10-CM | POA: Diagnosis not present

## 2021-09-19 DIAGNOSIS — R9431 Abnormal electrocardiogram [ECG] [EKG]: Secondary | ICD-10-CM | POA: Diagnosis not present

## 2021-09-19 DIAGNOSIS — R609 Edema, unspecified: Secondary | ICD-10-CM

## 2021-09-19 DIAGNOSIS — R188 Other ascites: Principal | ICD-10-CM | POA: Diagnosis present

## 2021-09-19 DIAGNOSIS — R2243 Localized swelling, mass and lump, lower limb, bilateral: Secondary | ICD-10-CM | POA: Diagnosis not present

## 2021-09-19 DIAGNOSIS — R600 Localized edema: Secondary | ICD-10-CM | POA: Diagnosis not present

## 2021-09-19 DIAGNOSIS — K7469 Other cirrhosis of liver: Secondary | ICD-10-CM | POA: Diagnosis present

## 2021-09-19 DIAGNOSIS — Z79899 Other long term (current) drug therapy: Secondary | ICD-10-CM | POA: Diagnosis not present

## 2021-09-19 DIAGNOSIS — Z794 Long term (current) use of insulin: Secondary | ICD-10-CM | POA: Diagnosis not present

## 2021-09-19 DIAGNOSIS — E1165 Type 2 diabetes mellitus with hyperglycemia: Secondary | ICD-10-CM | POA: Insufficient documentation

## 2021-09-19 DIAGNOSIS — J9601 Acute respiratory failure with hypoxia: Secondary | ICD-10-CM

## 2021-09-19 DIAGNOSIS — K7460 Unspecified cirrhosis of liver: Secondary | ICD-10-CM | POA: Diagnosis present

## 2021-09-19 DIAGNOSIS — R0602 Shortness of breath: Secondary | ICD-10-CM | POA: Diagnosis not present

## 2021-09-19 DIAGNOSIS — I509 Heart failure, unspecified: Secondary | ICD-10-CM | POA: Insufficient documentation

## 2021-09-19 DIAGNOSIS — Z7984 Long term (current) use of oral hypoglycemic drugs: Secondary | ICD-10-CM | POA: Insufficient documentation

## 2021-09-19 DIAGNOSIS — J449 Chronic obstructive pulmonary disease, unspecified: Secondary | ICD-10-CM | POA: Diagnosis not present

## 2021-09-19 HISTORY — DX: Heart failure, unspecified: I50.9

## 2021-09-19 LAB — URINALYSIS, ROUTINE W REFLEX MICROSCOPIC
Bilirubin Urine: NEGATIVE
Glucose, UA: NEGATIVE mg/dL
Ketones, ur: NEGATIVE mg/dL
Nitrite: NEGATIVE
Protein, ur: NEGATIVE mg/dL
Specific Gravity, Urine: 1.006 (ref 1.005–1.030)
pH: 5 (ref 5.0–8.0)

## 2021-09-19 LAB — COMPREHENSIVE METABOLIC PANEL
ALT: 15 U/L (ref 0–44)
AST: 32 U/L (ref 15–41)
Albumin: 2.5 g/dL — ABNORMAL LOW (ref 3.5–5.0)
Alkaline Phosphatase: 97 U/L (ref 38–126)
Anion gap: 5 (ref 5–15)
BUN: 12 mg/dL (ref 8–23)
CO2: 26 mmol/L (ref 22–32)
Calcium: 7.7 mg/dL — ABNORMAL LOW (ref 8.9–10.3)
Chloride: 104 mmol/L (ref 98–111)
Creatinine, Ser: 0.94 mg/dL (ref 0.44–1.00)
GFR, Estimated: 60 mL/min — ABNORMAL LOW (ref >60)
Glucose, Bld: 154 mg/dL — ABNORMAL HIGH (ref 70–99)
Potassium: 3.3 mmol/L — ABNORMAL LOW (ref 3.5–5.1)
Sodium: 135 mmol/L (ref 135–145)
Total Bilirubin: 4.1 mg/dL — ABNORMAL HIGH (ref 0.3–1.2)
Total Protein: 5 g/dL — ABNORMAL LOW (ref 6.5–8.1)

## 2021-09-19 LAB — CBC WITH DIFFERENTIAL/PLATELET
Abs Immature Granulocytes: 0.01 10*3/uL (ref 0.00–0.07)
Basophils Absolute: 0 10*3/uL (ref 0.0–0.1)
Basophils Relative: 0 %
Eosinophils Absolute: 0.1 10*3/uL (ref 0.0–0.5)
Eosinophils Relative: 3 %
HCT: 31.8 % — ABNORMAL LOW (ref 36.0–46.0)
Hemoglobin: 10.6 g/dL — ABNORMAL LOW (ref 12.0–15.0)
Immature Granulocytes: 0 %
Lymphocytes Relative: 20 %
Lymphs Abs: 0.9 10*3/uL (ref 0.7–4.0)
MCH: 32 pg (ref 26.0–34.0)
MCHC: 33.3 g/dL (ref 30.0–36.0)
MCV: 96.1 fL (ref 80.0–100.0)
Monocytes Absolute: 0.2 10*3/uL (ref 0.1–1.0)
Monocytes Relative: 5 %
Neutro Abs: 3.2 10*3/uL (ref 1.7–7.7)
Neutrophils Relative %: 72 %
Platelets: 70 10*3/uL — ABNORMAL LOW (ref 150–400)
RBC: 3.31 MIL/uL — ABNORMAL LOW (ref 3.87–5.11)
RDW: 19.9 % — ABNORMAL HIGH (ref 11.5–15.5)
WBC: 4.5 10*3/uL (ref 4.0–10.5)
nRBC: 0 % (ref 0.0–0.2)

## 2021-09-19 LAB — GLUCOSE, CAPILLARY: Glucose-Capillary: 134 mg/dL — ABNORMAL HIGH (ref 70–99)

## 2021-09-19 LAB — PROTIME-INR
INR: 1.9 — ABNORMAL HIGH (ref 0.8–1.2)
Prothrombin Time: 21.5 seconds — ABNORMAL HIGH (ref 11.4–15.2)

## 2021-09-19 LAB — MAGNESIUM: Magnesium: 1.3 mg/dL — ABNORMAL LOW (ref 1.7–2.4)

## 2021-09-19 LAB — BRAIN NATRIURETIC PEPTIDE: B Natriuretic Peptide: 141 pg/mL — ABNORMAL HIGH (ref 0.0–100.0)

## 2021-09-19 MED ORDER — FUROSEMIDE 10 MG/ML IJ SOLN
20.0000 mg | Freq: Once | INTRAMUSCULAR | Status: AC
  Administered 2021-09-19: 20 mg via INTRAVENOUS
  Filled 2021-09-19: qty 2

## 2021-09-19 MED ORDER — POTASSIUM CHLORIDE CRYS ER 20 MEQ PO TBCR
40.0000 meq | EXTENDED_RELEASE_TABLET | Freq: Once | ORAL | Status: AC
  Administered 2021-09-19: 40 meq via ORAL
  Filled 2021-09-19: qty 2

## 2021-09-19 MED ORDER — MAGNESIUM SULFATE 4 GM/100ML IV SOLN
4.0000 g | Freq: Once | INTRAVENOUS | Status: AC
  Administered 2021-09-19: 4 g via INTRAVENOUS
  Filled 2021-09-19: qty 100

## 2021-09-19 NOTE — H&P (Signed)
History and Physical    Patient: Brooke Gutierrez OEV:[id number] DOB: 1936-02-05 DOA: 09/19/2021 DOS: the patient was seen and examined on 09/20/2021 PCP: Neale Burly, MD  Patient coming from: Home  Chief Complaint:  Chief Complaint  Patient presents with   Shortness of Breath   Leg Swelling   HPI: Brooke Gutierrez is a 86 y.o. 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  She also complained of several weeks of increased leg swelling and this has gotten worse to the extent that she could barely walk again due to pain and pressure.  She endorses decreased urinary output due to decreased amount of urine obtained when she self caths (usually 3-4 times daily).  Apparently, patient's Lasix dose was recently changed due to an episode of severe hypokalemia and hypomagnesemia which required hospitalization per medical record from [facility].  She denies chest pain, headache, fever, chills, nausea, vomiting. Patient was recently admitted to [facility] from 7/3 to 7/4 due to similar presentation (bilateral lower extremity edema and ascites) when she was diuresed with IV Lasix with significant effect.  ED Course:  In the emergency department, patient was hemodynamically stable, BP was 116/45.  O2 sat was 89% on room air on arrival to the ED per ED medical record, supplemental oxygen at 2 LPM was provided with improved O2 sat to 96%.  Work-up in the ED showed normocytic anemia, thrombocytopenia, BMP was normal except for hypokalemia, hypomagnesemia, hyperglycemia, hypoalbuminemia, hyperbilirubinemia, BNP was 141  (this was 144 about 6 months ago).  Urinalysis was positive for large leukocytes and few bacteria. Chest x-ray showed no active cardiopulmonary disease Patient was treated with Lasix 20 mg x 1, potassium and magnesium were replenished.  Hospitalist was asked to admit patient for further evaluation and management.  Review of Systems: Review of systems as noted in the HPI. All other systems reviewed and are negative.   Past Medical History:  Diagnosis Date   CHF (congestive heart failure) (HCC)    Cirrhosis (HCC)    COPD (chronic obstructive pulmonary disease) (HCC)    Diabetes mellitus without complication ([place])    Past Surgical History:  Procedure Laterality Date   ABDOMINAL HYSTERECTOMY     APPENDECTOMY     BLADDER REPAIR     CESAREAN SECTION     GALLBLADDER SURGERY      Social History:  reports that she has never smoked. She has never used smokeless tobacco. She reports that she does not drink alcohol and does not use drugs.   Allergies  Allergen Reactions   Ciprofloxacin    Levaquin [Levofloxacin]    Sulfa Antibiotics    Other Rash    Adhesive tape    Family history: No pertinent family history  Prior to Admission medications   Medication Sig Start Date End Date Taking? Authorizing Provider  carvedilol (COREG) 3.125 MG tablet Take 1 tablet (3.125 mg total) by mouth 2 (two) times daily. Patient taking differently: Take 6.25 mg by mouth 2 (two) times daily. 03/22/21  Yes Emokpae, Courage, MD  cefdinir (OMNICEF) 300 MG capsule Take 300 mg by mouth 2 (two) times daily. 09/10/21  Yes [provider]  chlorhexidine (PERIDEX) 0.12 %  solution SMARTSIG:By Mouth 08/21/21  Yes [provider]  clonazePAM (KLONOPIN) 0.5 MG tablet Take 0.5 mg by mouth daily as needed. 08/23/19  Yes [provider]  ferrous sulfate 325 (65 FE) MG tablet Take 325 mg by mouth daily with breakfast.   Yes [provider]  furosemide (LASIX) 20 MG tablet Take 1 tablet (20  mg total) by mouth every Monday, Wednesday, and Friday. 03/22/21  Yes Emokpae, Courage, MD  insulin glargine (LANTUS) 100 UNIT/ML injection Inject 5-15 Units into the skin at bedtime. Sliding scale   Yes [provider]  insulin lispro (HUMALOG) 100 UNIT/ML injection Inject 10 Units into the skin 3 (three) times daily with meals. Sliding scale   Yes [provider]  potassium chloride (KLOR-CON) 10 MEQ tablet Take 1 tablet (10 mEq total) by mouth daily. Take While taking Lasix/furosemide 03/22/21  Yes Emokpae, Courage, MD  pravastatin (PRAVACHOL) 20 MG tablet Take 20 mg by mouth every evening.   Yes [provider]  spironolactone (ALDACTONE) 25 MG tablet Take 0.5 tablets (12.5 mg total) by mouth daily. 03/22/21 03/22/22 Yes Roxan Hockey, MD    Physical Exam: BP (!) 135/50 (BP Location: Left Arm)   Pulse 88   Temp 97.7 F (36.5 C) (Oral)   Resp 19   Ht '5\' 3"'$  (1.6 m)   Wt 77.5 kg   SpO2 95%   BMI 30.27 kg/m   General: 86 y.o. year-old female ill appearing, but in no acute distress.  Alert and oriented x3. HEENT: NCAT, EOMI Neck: Supple, trachea medial Cardiovascular: Regular rate and rhythm with no rubs or gallops.  No thyromegaly or JVD noted.  +2 lower extremity edema to the knees bilaterally. 2/4 pulses in all 4 extremities. Respiratory: Clear to auscultation with no wheezes or rales. Good inspiratory effort. Abdomen: Soft, nontender, mildly distended with normal bowel sounds x4 quadrants. Muskuloskeletal: No cyanosis or clubbing  noted bilaterally Neuro: CN II-XII intact, strength 5/5 x 4, sensation, reflexes intact Skin: No ulcerative lesions noted or rashes Psychiatry: Judgement and insight appear normal. Mood is appropriate for condition and setting          Labs on Admission:  Basic Metabolic Panel: Recent Labs  Lab 09/19/21 1747 09/19/21 1903  NA 135  --   K 3.3*  --   CL 104  --   CO2 26  --   GLUCOSE 154*  --   BUN 12  --   CREATININE  0.94  --   CALCIUM 7.7*  --   MG  --  1.3*   Liver Function Tests: Recent Labs  Lab 09/19/21 1747  AST 32  ALT 15  ALKPHOS 97  BILITOT 4.1*  PROT 5.0*  ALBUMIN 2.5*   No results for input(s): "LIPASE", "AMYLASE" in the last 168 hours. No results for input(s): "AMMONIA" in the last 168 hours. CBC: Recent Labs  Lab 09/19/21 1850  WBC 4.5  NEUTROABS 3.2  HGB 10.6*  HCT 31.8*  MCV 96.1  PLT 70*   Cardiac Enzymes: No results for input(s): "CKTOTAL", "CKMB", "CKMBINDEX", "TROPONINI" in the last 168 hours.  BNP (last 3 results) Recent Labs    03/21/21 1605 09/19/21 1830  BNP 144.0* 141.0*    ProBNP (last 3 results) No results for input(s): "PROBNP" in the last 8760 hours.  CBG: Recent Labs  Lab 09/19/21 2332  GLUCAP 134*    Radiological Exams on Admission: DG Chest 2 View  Result Date: 09/19/2021 CLINICAL DATA:  Edema. EXAM:  CHEST - 2 VIEW COMPARISON:  Chest x-ray 09/09/2021 FINDINGS: Right chest port catheter tip projects over the SVC, unchanged. The heart size and mediastinal contours are within normal limits. Both lungs are clear. The visualized skeletal structures are unremarkable. IMPRESSION: No active cardiopulmonary disease. Electronically Signed   By: Ronney Asters M.D.   On: 09/19/2021 17:39    EKG: I independently viewed the EKG done and my findings are as followed: Normal sinus rhythm at a rate of 74 bpm with QTc 499 ms  Assessment/Plan Present on Admission:  Ascites  Hypokalemia/Hypomagnesemia-due to Diarrhea and Diuretics  Liver cirrhosis secondary to NASH (nonalcoholic steatohepatitis) (HCC)  Mixed hyperlipidemia  Prolonged QT interval  Elevated brain natriuretic peptide (BNP) level  Principal Problem:   Ascites Active Problems:   Liver cirrhosis secondary to NASH (nonalcoholic steatohepatitis) (HCC)   Type 2 diabetes mellitus (HCC)   Elevated brain natriuretic peptide (BNP) level   Hypokalemia/Hypomagnesemia-due to Diarrhea and  Diuretics   Prolonged QT interval   Mixed hyperlipidemia   Lower extremity edema   Hyperbilirubinemia   Acute respiratory failure with hypoxia (HCC)  Ascites and bilateral lower extremity edema possibly due to hepatic cirrhosis with ascites due to NASH IV Lasix 20 mg x 1 was given with positive diuresis Continue Lasix 20 mg daily at this time Continue spironolactone 12.5 mg daily Consider paracentesis for worsening abdominal distention and discomfort  Acute respiratory failure with hypoxia in the setting of above This was possibly due to abdominal distention secondary to the ascites resulting in hardening of the abdominal wall.  There was a slight improvement in distention after Lasix administration slight improvement in work of breathing. Continue supplemental oxygen to maintain O2 sats > 94% and wean patient off this as tolerated (patient does not use supplemental oxygen at baseline)  Chronic thrombocytopenia Platelets 70, continue to monitor platelet levels with morning labs  Hypokalemia This is possibly due to diuresis K+ 3.3, this was replenished  Hypomagnesemia Mg level is 1.3 This will be replenished Please continue to monitor Mg level and correct accordingly  Prolonged QTc (499 ms) Avoid QT prolonging drugs Magnesium and potassium levels will be replenished Continue telemetry  Type 2 diabetes mellitus with hyperglycemia Continue ISS and hypoglycemic protocol  Hypoalbuminemia possibly secondary to moderate protein calorie malnutrition Albumin 2.5, protein supplement will be provided  Hyperbilirubinemia possibly due to liver cirrhosis Total bilirubin 4.1, direct bilirubin will be checked  Elevated BNP (chronic) Stable.  BNP was 141 (this was 144 about 6 months ago).  Mixed hyperlipidemia Continue on Pravachol  GERD Continue Protonix  Obesity current BMI 30.27 kg/m) Diet and lifestyle modification  History of Hodgkin's disease Continue outpatient  surveillance   DVT prophylaxis: SCDs   Advance Care Planning:   Code Status: Full Code   Consults: None  Family Communication: None at bedside  Severity of Illness: The appropriate patient status for this patient is OBSERVATION. Observation status is judged to be reasonable and necessary in order to provide the required intensity of service to ensure the patient's safety. The patient's presenting symptoms, physical exam findings, and initial radiographic and laboratory data in the context of their medical condition is felt to place them at decreased risk for further clinical deterioration. Furthermore, it is anticipated that the patient will be medically stable for discharge from the hospital within 2 midnights of admission.   Author: Bernadette Hoit, DO 09/20/2021 1:18 AM  For on call review [email or web address].

## 2021-09-19 NOTE — ED Provider Notes (Signed)
Care assumed from [place] at shift change, please see their note for full detail, but in brief Brooke Gutierrez is a 86 y.o. female presents with ascites and LE swelling.  Plan: Pending labs with likely admission   Physical Exam  BP (!) 126/49   Pulse 71   Temp 97.7 F (36.5 C) (Oral)   Resp 18   Ht '5\' 3"'$  (1.6 m)   Wt 76.2 kg   SpO2 98%   BMI 29.76 kg/m   Physical Exam  Procedures  Procedures  ED Course / MDM    Medical Decision Making Amount and/or Complexity of Data Reviewed Labs: ordered. Radiology: ordered.  Risk Prescription drug management.   I personally interpreted labs which were overall reassuring.  She does have a urinary tract infection, although patient states that she typically does have a UTI given how she caths 2-3 times a day.  BNP was at 141 stable from when it was checked 1 month ago.  No leukocytosis.  Her labs were otherwise reassuring.  Albumin 2.5.  My attending Dr. Verner Chol also evaluated patient with me and we placed ultrasound probe over the abdomen and visualized moderate amounts of abdominal fluid.  She is otherwise not significantly tender across the abdomen.  We did consider obtaining CT scan, however with her lack of pain, this did not seem clinically indicated.    Although patient was initially brought in for shortness of breath requiring new oxygen requirement, during my evaluation, she is not complaining of any shortness of breath.  She is primarily complaining of the large abdominal ascites and the discomfort it causes.  She does not have a gastroenterologist and has never had a paracentesis done before, so we will plan to bring her into the hospital for evaluation and paracentesis hopefully in the morning.  After hospitalist agrees to admit patient, I did reevaluate patient and she is now satting at room air at 98%.  So it does seem that her shortness of breath has improved.  Fortunately, she does not appear to be in acute CHF exacerbation  given her reassuring imaging and normal BNP.        Tonye Pearson, [place] 09/19/21 2237    Sherwood Gambler, MD 09/23/21 [phone]

## 2021-09-19 NOTE — ED Notes (Signed)
ED Provider at bedside. 

## 2021-09-19 NOTE — ED Provider Notes (Incomplete)
[facility] Provider Note   CSN: [id number] Arrival date & time: 09/19/21  1613     History {Add pertinent medical, surgical, social history, OB history to HPI:1} Chief Complaint  Patient presents with  . Shortness of Breath  . Leg Swelling    Brooke Gutierrez is a 86 y.o. female with history of liver cirrhosis secondary to NASH, hyperlipidemia, GERD, prolonged QT, pancytopenia, DMT2 on insulin, CHF, COPD, hypokalemia and hypomagnesemia.  With chief complaint of increasing swelling and shortness of breath over the last few weeks, but especially over the last few days.  States the swelling is so significant in her legs that she is unable to walk anymore due to the pain and pressure.  Also complains of increased size swelling and fluid in her belly.  Has decreased urinary output without other urinary symptoms.  Usually self caths 3-4 times per day, but is unable to get out more than a few drops at a time.  Denies recent fevers, cough, neck stiffness, chills, N/V/D.  Arrived to the ED at 89% saturation on room air, was placed on 2 L and is now satting around 96%.  Recently discharged from the ED at [facility] less than 2 weeks ago for similar presentation.  Today recommended by PCP to increase Lasix and come to the ED.  The history is provided by the patient and medical records.       Home Medications Prior to Admission medications   Medication Sig Start Date End Date Taking? Authorizing Provider  carvedilol (COREG) 3.125 MG tablet Take 1 tablet (3.125 mg total) by mouth 2 (two) times daily. 03/22/21   Roxan Hockey, MD  clonazePAM (KLONOPIN) 0.5 MG tablet Take 0.5 mg by mouth daily as needed. 08/23/19   [provider]  furosemide (LASIX) 20 MG tablet Take 1 tablet (20 mg total) by mouth every Monday, Wednesday, and Friday. 03/22/21   Roxan Hockey, MD  insulin glargine (LANTUS) 100 UNIT/ML injection Inject 15 Units into the skin at bedtime. Sliding scale     [provider]  insulin lispro (HUMALOG) 100 UNIT/ML injection Inject 10-20 Units into the skin 3 (three) times daily with meals. Sliding scale    [provider]  metFORMIN (GLUCOPHAGE) 500 MG tablet Take 500 mg by mouth 2 (two) times daily. 04/25/19   [provider]  pantoprazole (PROTONIX) 40 MG tablet Take 40 mg by mouth 2 (two) times daily. 03/22/19   [provider]  potassium chloride (KLOR-CON) 10 MEQ tablet Take 1 tablet (10 mEq total) by mouth daily. Take While taking Lasix/furosemide 03/22/21   Roxan Hockey, MD  pravastatin (PRAVACHOL) 20 MG tablet Take 20 mg by mouth every evening.    [provider]  spironolactone (ALDACTONE) 25 MG tablet Take 0.5 tablets (12.5 mg total) by mouth daily. 03/22/21 03/22/22  Roxan Hockey, MD      Allergies    Ciprofloxacin, Levaquin [levofloxacin], Sulfa antibiotics, and Other    Review of Systems   Review of Systems  Respiratory:  Positive for shortness of breath.   Cardiovascular:  Positive for leg swelling.    Physical Exam Updated Vital Signs BP (!) 104/46   Pulse 73   Temp 97.7 F (36.5 C) (Oral)   Resp 19   Ht '5\' 3"'$  (1.6 m)   Wt 76.2 kg   SpO2 98%   BMI 29.76 kg/m  Physical Exam Vitals and nursing note reviewed.  Constitutional:      General: She is not  in acute distress.    Appearance: She is well-developed. She is obese. She is ill-appearing. She is not toxic-appearing or diaphoretic.  HENT:     Head: Normocephalic and atraumatic.  Eyes:     General: Lids are normal. Vision grossly intact. Gaze aligned appropriately.     Conjunctiva/sclera: Conjunctivae normal.  Cardiovascular:     Rate and Rhythm: Normal rate and regular rhythm.     Pulses: Normal pulses.          Radial pulses are 2+ on the right side and 2+ on the left side.     Heart sounds: Normal heart sounds. No murmur heard.    Comments: Bilateral pitting edema to the abdomen.  Lower extremities appear  neurovascularly intact. Pulmonary:     Effort: Pulmonary effort is normal. No respiratory distress.     Breath sounds: Rales present.  Chest:     Chest wall: No tenderness.  Abdominal:     General: Abdomen is protuberant. There is distension.     Palpations: Abdomen is soft. There is shifting dullness.     Tenderness: There is no abdominal tenderness. There is no right CVA tenderness, left CVA tenderness or guarding. Negative signs include Murphy's sign and McBurney's sign.  Musculoskeletal:        General: No swelling.     Cervical back: Neck supple. No rigidity or tenderness.     Right lower leg: 3+ Pitting Edema present.     Left lower leg: 3+ Pitting Edema present.  Skin:    General: Skin is warm and dry.     Capillary Refill: Capillary refill takes less than 2 seconds.  Neurological:     Mental Status: She is alert and oriented to person, place, and time.     GCS: GCS eye subscore is 4. GCS verbal subscore is 5. GCS motor subscore is 6.  Psychiatric:        Mood and Affect: Mood normal.        Behavior: Behavior is cooperative.     ED Results / Procedures / Treatments   Labs (all labs ordered are listed, but only abnormal results are displayed) Labs Reviewed  COMPREHENSIVE METABOLIC PANEL  PROTIME-INR  URINALYSIS, ROUTINE W REFLEX MICROSCOPIC  BRAIN NATRIURETIC PEPTIDE  CBC WITH DIFFERENTIAL/PLATELET    EKG None  Radiology DG Chest 2 View  Result Date: 09/19/2021 CLINICAL DATA:  Edema. EXAM: CHEST - 2 VIEW COMPARISON:  Chest x-ray 09/09/2021 FINDINGS: Right chest port catheter tip projects over the SVC, unchanged. The heart size and mediastinal contours are within normal limits. Both lungs are clear. The visualized skeletal structures are unremarkable. IMPRESSION: No active cardiopulmonary disease. Electronically Signed   By: Ronney Asters M.D.   On: 09/19/2021 17:39    Procedures Procedures  {Document cardiac monitor, telemetry assessment procedure when  appropriate:1}  Medications Ordered in ED Medications  furosemide (LASIX) injection 20 mg (20 mg Intravenous Given 09/19/21 1855)    ED Course/ Medical Decision Making/ A&P                           Medical Decision Making Amount and/or Complexity of Data Reviewed Labs: ordered. Radiology: ordered.  Risk Prescription drug management. Decision regarding hospitalization.   86 y.o. female presents to the ED for concern of Shortness of Breath and Leg Swelling     This involves an extensive number of treatment options, and is a complaint that carries with it a high  risk of complications and morbidity.     Past Medical History / Co-morbidities / Social History: Hx of liver cirrhosis secondary to NASH, hyperlipidemia, GERD, prolonged QT, pancytopenia, DMT2 on insulin, CHF, COPD, hypokalemia and hypomagnesemia Social Determinants of Health include: None  Additional History:  Internal and external records from outside source obtained and reviewed including ED visits from [place] admission 09/09/21 which detail recent changes to treatment regimen, such as increased Lasix, UTI treatment with Rocephin, and documented hypokalemia and hypomagnesemia.  Lab Tests: I ordered, and personally interpreted labs.  The pertinent results include:   CBC, CMP, BNP, PT/INR, UA: Pending  Imaging Studies: I ordered imaging studies including CXR.   I independently visualized and interpreted imaging which showed no active cardiopulmonary disease I agree with the radiologist interpretation.  Cardiac Monitoring: The patient was maintained on a cardiac monitor.  I personally viewed and interpreted the cardiac monitored which showed an underlying rhythm of: normal sinus rhythm  ED Course / Critical Interventions: Pt ill-appearing on exam.  Presents with increased edema, shortness of breath, and appears clinically fluid overloaded.  3+ to 4+ pitting edema of lower extremities up to the abdomen.  Positive  fluid wave sign and enlarged abdomen suggestive of ascites.  Complains of increased abdominal pressure and hx of known ascites.  Per FM this is Rales appreciated on exam.  Improvement noted when placed on 2 L O2, not on O2 at home.  Recently discharged from [facility] ED for similar presentation.  Recommended by PCP to increase Lasix and come to the ED for further treatment today.  Unable to ambulate well due to elicited pain from lower extremity swelling.  Without chest pain or tightness, fevers, recent upper respiratory infection, congestion, cough, or changes in bowel habits.  Has noted decreased urinary output, but without other urinary symptoms.  All extremities still appear neurovascularly intact.  HR 88 bpm, without recent cancer treatments in the last 6 months, no hemoptysis, no fevers, no IV drug use, pulmonary embolism is less probable at this point in time.  Clinical exam not strongly suggestive of DVT.  Initially clinical picture more strongly suggestive of likely cirrhotic liver and/or CHF involvement. I have reviewed the patients home medicines and have made adjustments as needed.  Disposition: 1915  care of ASJAH RAUDA transferred to [facility] and Dr. Karle Starch at the end of my shift.  Patient case discussed at length.  Please see his/her note for further details.  Plan at time of handoff is to continue workup -- anticipate likely admission due to possible acute CHF.  This may be altered or completely changed at the discretion of the oncoming team pending results of further workup.  I discussed this case with my attending, Dr. Regenia Skeeter, who agreed with the proposed treatment course and cosigned this note including patient's presenting symptoms, physical exam, and planned diagnostics and interventions.  Attending physician stated agreement with plan or made changes to plan which were implemented.     This chart was dictated using voice recognition software.  Despite best efforts to  proofread, errors can occur which can change the documentation meaning.   {Document critical care time when appropriate:1} {Document review of labs and clinical decision tools ie heart score, Chads2Vasc2 etc:1}  {Document your independent review of radiology images, and any outside records:1} {Document your discussion with family members, caretakers, and with consultants:1} {Document social determinants of health affecting pt's care:1} {Document your decision making why or why not admission, treatments were  needed:1} Final Clinical Impression(s) / ED Diagnoses Final diagnoses:  Other ascites  Peripheral edema  Shortness of breath    Rx / DC Orders ED Discharge Orders     None

## 2021-09-19 NOTE — ED Triage Notes (Signed)
Pt presents to ED with complaints of increased swelling in bilateral legs and stomach. Pt with history nonalcoholic cirrhosis

## 2021-09-19 NOTE — ED Provider Notes (Cosign Needed Addendum)
[facility] EMERGENCY DEPARTMENT Provider Note   CSN: [id number] Arrival date & time: 09/19/21  1613     History  Chief Complaint  Patient presents with   Shortness of Breath   Leg Swelling    Brooke Gutierrez is a 86 y.o. female with history of liver cirrhosis secondary to NASH, hyperlipidemia, GERD, prolonged QT, pancytopenia, DMT2 on insulin, CHF, COPD, hypokalemia and hypomagnesemia.  With chief complaint of increasing swelling and shortness of breath over the last few weeks, but especially over the last few days.  States the swelling is so significant in her legs that she is unable to walk anymore due to the pain and pressure.  Also complains of increased size swelling and fluid in her belly.  Has decreased urinary output without other urinary symptoms.  Usually self caths 3-4 times per day, but is unable to get out more than a few drops at a time.  Denies recent fevers, cough, neck stiffness, chills, N/V/D.  Arrived to the ED at 89% saturation on room air, was placed on 2 L and is now satting around 96%.  Recently discharged from the ED at [facility] less than 2 weeks ago for similar presentation.  Today recommended by PCP to increase Lasix and come to the ED.  The history is provided by the patient and medical records.       Home Medications Prior to Admission medications   Medication Sig Start Date End Date Taking? Authorizing Provider  carvedilol (COREG) 3.125 MG tablet Take 1 tablet (3.125 mg total) by mouth 2 (two) times daily. 03/22/21   Roxan Hockey, MD  clonazePAM (KLONOPIN) 0.5 MG tablet Take 0.5 mg by mouth daily as needed. 08/23/19   [provider]  furosemide (LASIX) 20 MG tablet Take 1 tablet (20 mg total) by mouth every Monday, Wednesday, and Friday. 03/22/21   Roxan Hockey, MD  insulin glargine (LANTUS) 100 UNIT/ML injection Inject 15 Units into the skin at bedtime. Sliding scale    [provider]  insulin lispro (HUMALOG) 100 UNIT/ML injection  Inject 10-20 Units into the skin 3 (three) times daily with meals. Sliding scale    [provider]  metFORMIN (GLUCOPHAGE) 500 MG tablet Take 500 mg by mouth 2 (two) times daily. 04/25/19   [provider]  pantoprazole (PROTONIX) 40 MG tablet Take 40 mg by mouth 2 (two) times daily. 03/22/19   [provider]  potassium chloride (KLOR-CON) 10 MEQ tablet Take 1 tablet (10 mEq total) by mouth daily. Take While taking Lasix/furosemide 03/22/21   Roxan Hockey, MD  pravastatin (PRAVACHOL) 20 MG tablet Take 20 mg by mouth every evening.    [provider]  spironolactone (ALDACTONE) 25 MG tablet Take 0.5 tablets (12.5 mg total) by mouth daily. 03/22/21 03/22/22  Roxan Hockey, MD      Allergies    Ciprofloxacin, Levaquin [levofloxacin], Sulfa antibiotics, and Other    Review of Systems   Review of Systems  Respiratory:  Positive for shortness of breath.   Cardiovascular:  Positive for leg swelling.    Physical Exam Updated Vital Signs BP (!) 104/46   Pulse 73   Temp 97.7 F (36.5 C) (Oral)   Resp 19   Ht '5\' 3"'$  (1.6 m)   Wt 76.2 kg   SpO2 98%   BMI 29.76 kg/m  Physical Exam Vitals and nursing note reviewed.  Constitutional:      General: She is not in acute distress.    Appearance: She is  well-developed. She is obese. She is ill-appearing. She is not toxic-appearing or diaphoretic.  HENT:     Head: Normocephalic and atraumatic.  Eyes:     General: Lids are normal. Vision grossly intact. Gaze aligned appropriately.     Conjunctiva/sclera: Conjunctivae normal.  Cardiovascular:     Rate and Rhythm: Normal rate and regular rhythm.     Pulses: Normal pulses.          Radial pulses are 2+ on the right side and 2+ on the left side.     Heart sounds: Normal heart sounds. No murmur heard.    Comments: Bilateral pitting edema to the abdomen.  Lower extremities appear neurovascularly intact. Pulmonary:     Effort: Pulmonary effort is normal. No  respiratory distress.     Breath sounds: Rales present.  Chest:     Chest wall: No tenderness.  Abdominal:     General: Abdomen is protuberant. There is distension.     Palpations: Abdomen is soft. There is shifting dullness.     Tenderness: There is no abdominal tenderness. There is no right CVA tenderness, left CVA tenderness or guarding. Negative signs include Murphy's sign and McBurney's sign.  Musculoskeletal:        General: No swelling.     Cervical back: Neck supple. No rigidity or tenderness.     Right lower leg: 3+ Pitting Edema present.     Left lower leg: 3+ Pitting Edema present.  Skin:    General: Skin is warm and dry.     Capillary Refill: Capillary refill takes less than 2 seconds.  Neurological:     Mental Status: She is alert and oriented to person, place, and time.     GCS: GCS eye subscore is 4. GCS verbal subscore is 5. GCS motor subscore is 6.  Psychiatric:        Mood and Affect: Mood normal.        Behavior: Behavior is cooperative.     ED Results / Procedures / Treatments   Labs (all labs ordered are listed, but only abnormal results are displayed) Labs Reviewed  COMPREHENSIVE METABOLIC PANEL   PROTIME-INR   URINALYSIS, ROUTINE W REFLEX MICROSCOPIC   BRAIN NATRIURETIC PEPTIDE   CBC WITH DIFFERENTIAL/PLATELET   MAGNESIUM     EKG None  Radiology DG Chest 2 View  Result Date: 09/19/2021 CLINICAL DATA:  Edema. EXAM: CHEST - 2 VIEW COMPARISON:  Chest x-ray 09/09/2021 FINDINGS: Right chest port catheter tip projects over the SVC, unchanged. The heart size and mediastinal contours are within normal limits. Both lungs are clear. The visualized skeletal structures are unremarkable. IMPRESSION: No active cardiopulmonary disease. Electronically Signed   By: Ronney Asters M.D.   On: 09/19/2021 17:39    Procedures Procedures    Medications Ordered in ED Medications  furosemide (LASIX) injection 20 mg (20 mg Intravenous Given 09/19/21 1855)    ED  Course/ Medical Decision Making/ A&P                           Medical Decision Making Amount and/or Complexity of Data Reviewed Labs: ordered. Radiology: ordered.  Risk Prescription drug management. Decision regarding hospitalization.   86 y.o. female presents to the ED for concern of Shortness of Breath and Leg Swelling     This involves an extensive number of treatment options, and is a complaint that carries with it a high risk of complications and morbidity.  Past Medical History / Co-morbidities / Social History: Hx of liver cirrhosis secondary to NASH, hyperlipidemia, GERD, prolonged QT, pancytopenia, DMT2 on insulin, CHF, COPD, hypokalemia and hypomagnesemia Social Determinants of Health include: None  Additional History:  Internal and external records from outside source obtained and reviewed including ED visits from [place] admission 09/09/21 which detail recent changes to treatment regimen, such as increased Lasix, UTI treatment with Rocephin, and documented hypokalemia and hypomagnesemia.  Lab Tests: I ordered, and personally interpreted labs.  The pertinent results include:   CBC, CMP, BNP, PT/INR, UA, Mg: Pending  Imaging Studies: I ordered imaging studies including CXR.   I independently visualized and interpreted imaging which showed no active cardiopulmonary disease I agree with the radiologist interpretation.  Cardiac Monitoring: The patient was maintained on a cardiac monitor.  I personally viewed and interpreted the cardiac monitored which showed an underlying rhythm of: normal sinus rhythm  ED Course / Critical Interventions: Pt ill-appearing on exam.  Presents with increased edema, shortness of breath, and appears clinically fluid overloaded.  3+ to 4+ pitting edema of lower extremities up to the abdomen.  Positive fluid wave sign and enlarged abdomen suggestive of ascites.  Complains of increased abdominal pressure and hx of known ascites.  Has never  had paracentesis performed.  Per FM this is much worse than her baseline fluid retention.  Rales appreciated on exam.  Moderate respiratory improvement noted when placed on 2 L O2, not currently on O2 at home.  Recently discharged from [facility] ED for similar presentation.  Per mentioned [facility] admission notes, the use of lasix provided significant improvement of symptoms.  Today pt's PCP recommended to increase Lasix and come to the ED for further treatment.  Unable to ambulate well due to elicited pain from lower extremity swelling.  Without chest pain or tightness, fevers, recent upper respiratory infection, congestion, cough, or changes in bowel habits.  Has noted decreased urinary output, but without other urinary symptoms at this time.  Recently treated for UTI, and frequently self-caths for urinary relief.  All extremities still appear neurovascularly intact.  HR 88 bpm, without recent cancer treatments in the last 6 months, no hemoptysis, no fevers, no IV drug use, pulmonary embolism is less probable at this point in time.  Clinical exam not strongly suggestive of DVT.  Initially suspicious of CHF at this time.  Plan to provide one dose of IV lasix '20mg'$  and reassess.  May benefit from paracentesis and/or additional IV lasix.  Plan to monitor potassium and magnesium simultaneously. 1855: IV lasix provided.  Workup still in progress.  Disposition: 1915 care of AIRYANA SPRUNGER transferred to [facility] and Dr. Regenia Skeeter at the end of my shift.  Patient case discussed at length.  Please see his/her note for further details.  Plan at time of handoff is to continue workup -- anticipate likely admission due to possible acute CHF and possible need for paracentesis.  This may be altered or completely changed at the discretion of the oncoming team pending results of further workup.  I discussed this case with my attending, Dr. Regenia Skeeter, who agreed with the proposed treatment course and cosigned this note  including patient's presenting symptoms, physical exam, and planned diagnostics and interventions.  Attending physician stated agreement with plan or made changes to plan which were implemented.     This chart was dictated using voice recognition software.  Despite best efforts to proofread, errors can occur which can change the documentation meaning.  Final Clinical Impression(s) / ED Diagnoses Final diagnoses:  Other ascites  Peripheral edema  Shortness of breath    Rx / DC Orders ED Discharge Orders     None         Prince Rome, PA-C 81/77/11 6579    Prince Rome, PA-C 03/83/33 [id number]    Prince Rome, PA-C 19/16/60 0030    Sherwood Gambler, MD 09/23/21 [phone]

## 2021-09-20 ENCOUNTER — Observation Stay (HOSPITAL_COMMUNITY): Payer: Medicare Other

## 2021-09-20 ENCOUNTER — Encounter (HOSPITAL_COMMUNITY): Payer: Self-pay | Admitting: Internal Medicine

## 2021-09-20 DIAGNOSIS — K7469 Other cirrhosis of liver: Secondary | ICD-10-CM | POA: Diagnosis not present

## 2021-09-20 DIAGNOSIS — R600 Localized edema: Secondary | ICD-10-CM

## 2021-09-20 DIAGNOSIS — R188 Other ascites: Secondary | ICD-10-CM | POA: Diagnosis not present

## 2021-09-20 DIAGNOSIS — J9601 Acute respiratory failure with hypoxia: Secondary | ICD-10-CM

## 2021-09-20 LAB — HEMOGLOBIN A1C
Hgb A1c MFr Bld: 4.4 % — ABNORMAL LOW (ref 4.8–5.6)
Mean Plasma Glucose: 79.58 mg/dL

## 2021-09-20 LAB — PHOSPHORUS: Phosphorus: 2.9 mg/dL (ref 2.5–4.6)

## 2021-09-20 LAB — BODY FLUID CELL COUNT WITH DIFFERENTIAL
Eos, Fluid: 0 %
Lymphs, Fluid: 58 %
Monocyte-Macrophage-Serous Fluid: 31 % — ABNORMAL LOW (ref 50–90)
Neutrophil Count, Fluid: 11 % (ref 0–25)
Total Nucleated Cell Count, Fluid: 219 cu mm (ref 0–1000)

## 2021-09-20 LAB — MAGNESIUM: Magnesium: 2.1 mg/dL (ref 1.7–2.4)

## 2021-09-20 LAB — CBC
HCT: 30.7 % — ABNORMAL LOW (ref 36.0–46.0)
Hemoglobin: 10.2 g/dL — ABNORMAL LOW (ref 12.0–15.0)
MCH: 32.1 pg (ref 26.0–34.0)
MCHC: 33.2 g/dL (ref 30.0–36.0)
MCV: 96.5 fL (ref 80.0–100.0)
Platelets: 67 10*3/uL — ABNORMAL LOW (ref 150–400)
RBC: 3.18 MIL/uL — ABNORMAL LOW (ref 3.87–5.11)
RDW: 19.9 % — ABNORMAL HIGH (ref 11.5–15.5)
WBC: 4.2 10*3/uL (ref 4.0–10.5)
nRBC: 0 % (ref 0.0–0.2)

## 2021-09-20 LAB — COMPREHENSIVE METABOLIC PANEL
ALT: 14 U/L (ref 0–44)
AST: 29 U/L (ref 15–41)
Albumin: 2.3 g/dL — ABNORMAL LOW (ref 3.5–5.0)
Alkaline Phosphatase: 88 U/L (ref 38–126)
Anion gap: 7 (ref 5–15)
BUN: 12 mg/dL (ref 8–23)
CO2: 28 mmol/L (ref 22–32)
Calcium: 8 mg/dL — ABNORMAL LOW (ref 8.9–10.3)
Chloride: 104 mmol/L (ref 98–111)
Creatinine, Ser: 0.88 mg/dL (ref 0.44–1.00)
GFR, Estimated: >60 mL/min (ref >60)
Glucose, Bld: 106 mg/dL — ABNORMAL HIGH (ref 70–99)
Potassium: 3.3 mmol/L — ABNORMAL LOW (ref 3.5–5.1)
Sodium: 139 mmol/L (ref 135–145)
Total Bilirubin: 3.4 mg/dL — ABNORMAL HIGH (ref 0.3–1.2)
Total Protein: 4.7 g/dL — ABNORMAL LOW (ref 6.5–8.1)

## 2021-09-20 LAB — GLUCOSE, CAPILLARY
Glucose-Capillary: 125 mg/dL — ABNORMAL HIGH (ref 70–99)
Glucose-Capillary: 98 mg/dL (ref 70–99)
Glucose-Capillary: 157 mg/dL — ABNORMAL HIGH (ref 70–99)
Glucose-Capillary: 134 mg/dL — ABNORMAL HIGH (ref 70–99)
Glucose-Capillary: 138 mg/dL — ABNORMAL HIGH (ref 70–99)

## 2021-09-20 LAB — BILIRUBIN, DIRECT: Bilirubin, Direct: 1.1 mg/dL — ABNORMAL HIGH (ref 0.0–0.2)

## 2021-09-20 LAB — GRAM STAIN: Gram Stain: NONE SEEN

## 2021-09-20 MED ORDER — IBUPROFEN 400 MG PO TABS: 400.0000 mg | ORAL_TABLET | Freq: Once | ORAL | Status: DC

## 2021-09-20 MED ORDER — GLUCERNA SHAKE PO LIQD
237.0000 mL | Freq: Three times a day (TID) | ORAL | Status: DC
  Administered 2021-09-20 – 2021-09-21 (×3): 237 mL via ORAL

## 2021-09-20 MED ORDER — INSULIN ASPART 100 UNIT/ML IJ SOLN
0.0000 [IU] | Freq: Three times a day (TID) | INTRAMUSCULAR | Status: DC
  Administered 2021-09-20: 3 [IU] via SUBCUTANEOUS
  Administered 2021-09-20 – 2021-09-21 (×2): 2 [IU] via SUBCUTANEOUS

## 2021-09-20 MED ORDER — INSULIN ASPART 100 UNIT/ML IJ SOLN: 0.0000 [IU] | Freq: Every day | INTRAMUSCULAR | Status: DC

## 2021-09-20 MED ORDER — CHLORHEXIDINE GLUCONATE CLOTH 2 % EX PADS
6.0000 | MEDICATED_PAD | Freq: Every day | CUTANEOUS | Status: DC
Start: 2021-09-20 — End: 2021-09-21
  Administered 2021-09-20 – 2021-09-21 (×2): 6 via TOPICAL

## 2021-09-20 MED ORDER — NYSTATIN 100000 UNIT/GM EX POWD
Freq: Three times a day (TID) | CUTANEOUS | Status: DC
  Filled 2021-09-20: qty 15

## 2021-09-20 MED ORDER — POTASSIUM CHLORIDE CRYS ER 20 MEQ PO TBCR
40.0000 meq | EXTENDED_RELEASE_TABLET | Freq: Once | ORAL | Status: AC
Start: 2021-09-20 — End: 2021-09-20
  Administered 2021-09-20: 40 meq via ORAL
  Filled 2021-09-20: qty 2

## 2021-09-20 MED ORDER — FUROSEMIDE 10 MG/ML IJ SOLN
20.0000 mg | Freq: Every day | INTRAMUSCULAR | Status: DC
  Administered 2021-09-20 – 2021-09-21 (×2): 20 mg via INTRAVENOUS
  Filled 2021-09-20 (×2): qty 2

## 2021-09-20 MED ORDER — ALBUMIN HUMAN 25 % IV SOLN: 25.0000 g | Freq: Once | INTRAVENOUS | Status: DC | PRN

## 2021-09-20 MED ORDER — SPIRONOLACTONE 12.5 MG HALF TABLET
12.5000 mg | ORAL_TABLET | Freq: Every day | ORAL | Status: DC
  Administered 2021-09-20 – 2021-09-21 (×2): 12.5 mg via ORAL
  Filled 2021-09-20 (×2): qty 1

## 2021-09-20 MED ORDER — PANTOPRAZOLE SODIUM 40 MG PO TBEC
40.0000 mg | DELAYED_RELEASE_TABLET | Freq: Every day | ORAL | Status: DC
  Administered 2021-09-20 – 2021-09-21 (×2): 40 mg via ORAL
  Filled 2021-09-20 (×2): qty 1

## 2021-09-20 MED ORDER — INSULIN ASPART 100 UNIT/ML IJ SOLN
0.0000 [IU] | INTRAMUSCULAR | Status: DC
  Administered 2021-09-20 (×2): 2 [IU] via SUBCUTANEOUS

## 2021-09-20 NOTE — Progress Notes (Signed)
PT tolerated right sided paracentesis procedure well today and 2.3 Liters of clear yellow fluid removed and sent to lab for processing. PT verbalized understanding of post procedure instructions and taken back to inpatient bed assignment at this time with NAD noted.

## 2021-09-20 NOTE — Consult Note (Signed)
Gastroenterology Consult   Referring Provider: No ref. provider found Primary Care Physician:  Brooke Burly, MD Primary Gastroenterologist: Previously unassigned  Patient ID: Brooke Gutierrez; [id number]; [birth year]   Admit date: 09/19/2021  LOS: 0 days   Date of Consultation: 09/20/2021  Reason for Consultation:  Cirrhosis with ascites, possible need for paracentesis  History of Present Illness   Brooke Gutierrez is a 86 y.o. year old female with history of cirrhosis, Hodgkin's disease with mixed cellularity, initial clinical stage IIIb diagnosed 2008 and treated with ABVD, type 2 diabetes, GERD, HLD, GERD, IDA, thrombocytopenia, and esophageal varices who presented to the ED with 1 month of progressive increased abdominal girth and lower extremity edema with shortness of breath.  Although she has a noted history of cirrhosis, patient does not have a primary GI physician. GI consulted for further evaluation management of cirrhosis and ascites with possible need for paracentesis.  ED Course:  Oxygen saturation 89% on room air on arrival to the ED, she was placed on 2 L nasal cannula with saturations improved to 96%.  Vital signs otherwise stable. Labs -normal LFTs, bilirubin 4.1 (3.5 at [facility] on 09/10/2021), sodium 135, creatinine 0.94, albumin 2.5, INR 1.9 (1.5 on 09/09/21), PT 21.5, BNP 141, Hgb 10.6, MCV 96.1, platelets 70 UA positive for large leukocytes and few bacteria. CXR with no active cardiopulmonary disease She was given Lasix 20 mg x 1, potassium and magnesium replaced.    Consult: Per review of chart patient had hospital admission in 2016 with [facility] due to pneumonia and anemia.  She received 4 units PRBCs for hemoglobin 7.7.  She underwent EGD and colonoscopy at that time.  EGD showed esophageal/gastric varices, gastritis without any obvious source of bleeding.  There was notation of severe gastritis and history of antral ulcer on previous endoscopies.  EGD and colonoscopy  reports unable to be viewed via care everywhere.  Iron was 38, saturation 9%.  Noted to have some thrombocytopenia as well.  Patient has had 2 recent admissions this year for dyspnea and possible acute on chronic congestive heart failure and lower extremity edema.  Most recent mission was at [facility] on 7/3-7/4 for bilateral lower extremity edema.  She reported that she had previously been on Lasix but her dose was changed due to severe hypokalemia and hypomagnesemia.  She was given IV dose of Lasix with significant effect noting improvement in leg swelling back to her normal size on 7/4.  She was advised to take oral Lasix every other day as well as daily potassium supplementation.  Of note she was also on 12.5 mg of Aldactone every day.  She was advised to follow-up with PCP for repeat blood work in a week.  During this hospitalization she had UA suggestive of UTI and was treated with Rocephin.  She was counseled on low-salt diet.  During this admission she has CTA chest with contrast with no evidence of PE, mild slight nodular appearing right upper lobe atelectasis, findings consistent with cirrhosis and portal hypertension with mild splenomegaly, large amount of ascites in the visualized portion of the abdomen.   Per report of patient she began experiencing increased lower extremity edema and increasing abdominal girth for at least a few weeks prior to her recent admission on 09/09/2021 at outside hospital.  She had had her Lasix dose reduced due to hypokalemia and hypomagnesemia.  She presented to the outside hospital with complaint of bilateral lower extremity edema improved with IV Lasix and had  repletion of her magnesium and potassium.  Patient and her daughter both note that she has significant improvement of her edema with treatment while inpatient but after discharge she rapidly began retaining fluid.  She reports about a 30 pound weight gain since most recent discharge.  She reports she weighed  130 pounds then and now she weighs about 160 pounds.  She received additional dose of IV Lasix as well as repletion of her electrolytes in the ED yesterday.  She notes that today her swelling is much improved peripherally and abdominally.  She states that her swelling had gotten so bad at home that she could barely walk and had extreme pain.  She denies a diet high in sodium, does not consume added salt.  She states that she also does not eat very many starchy foods.  At baseline she does not have adequate urinary output, caths herself about 3-4 times a day.  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  She denies any recent confusion.  Does have occasional pruritus.  She states in addition to Lasix 20 mg daily that she takes at home she also takes 12.5 mg of Aldactone.  Patient denies any abdominal pain, nausea, vomiting, fever, chills, lack of appetite, confusion.    Past Medical History:  Diagnosis Date   CHF (congestive heart failure) (HCC)    Cirrhosis (HCC)    COPD (chronic obstructive pulmonary disease) (HCC)    Diabetes mellitus without complication ([place])     Past Surgical History:  Procedure Laterality Date   ABDOMINAL HYSTERECTOMY     APPENDECTOMY     BLADDER REPAIR     CESAREAN SECTION     GALLBLADDER SURGERY      Prior to Admission medications   Medication Sig Start Date End Date Taking? Authorizing Provider  carvedilol (COREG) 3.125 MG tablet Take 1 tablet (3.125 mg total) by mouth 2 (two) times daily. Patient taking differently: Take 6.25 mg by mouth 2 (two) times daily. 03/22/21  Yes Emokpae, Courage, MD  cefdinir (OMNICEF) 300 MG capsule Take 300 mg by mouth 2 (two) times daily. 09/10/21  Yes [provider]  chlorhexidine (PERIDEX) 0.12 % solution SMARTSIG:By Mouth 08/21/21  Yes [provider]  clonazePAM (KLONOPIN) 0.5 MG tablet Take 0.5 mg by mouth daily as needed. 08/23/19  Yes [provider]  ferrous sulfate 325 (65 FE) MG tablet Take 325 mg by mouth daily with breakfast.   Yes [provider]  furosemide (LASIX) 20 MG tablet Take 1 tablet (20 mg total) by mouth every Monday, Wednesday, and Friday. 03/22/21  Yes Emokpae, Courage, MD  insulin glargine (LANTUS) 100 UNIT/ML injection Inject 5-15 Units into the skin at bedtime. Sliding scale   Yes [provider]  insulin lispro (HUMALOG) 100 UNIT/ML injection Inject 10 Units into the skin 3 (three) times daily with meals. Sliding scale   Yes [provider]  potassium chloride (KLOR-CON) 10 MEQ tablet Take 1 tablet (10 mEq total) by mouth daily. Take While taking Lasix/furosemide 03/22/21  Yes Emokpae, Courage, MD  pravastatin (PRAVACHOL) 20 MG tablet Take 20 mg by mouth every evening.   Yes [provider]  spironolactone (ALDACTONE) 25 MG tablet Take 0.5 tablets (12.5 mg total) by mouth daily. 03/22/21 03/22/22 Yes Roxan Hockey, MD    Current Facility-Administered Medications  Medication Dose Route Frequency Provider Last Rate Last Admin   Chlorhexidine Gluconate Cloth 2 % PADS  6 each  6 each Topical Daily Heath Lark D, DO   6 each at 09/20/21 0943   feeding supplement (GLUCERNA SHAKE) (GLUCERNA SHAKE) liquid 237 mL  237 mL Oral TID BM Adefeso, Oladapo, DO       furosemide (LASIX) injection 20 mg  20 mg Intravenous Daily Adefeso, Oladapo, DO   20 mg at 09/20/21 0945   ibuprofen (ADVIL) tablet 400 mg  400 mg Oral Once Adefeso, Oladapo, DO       insulin aspart (novoLOG) injection 0-15 Units  0-15 Units Subcutaneous TID WC Shah, Pratik D, DO       insulin aspart (novoLOG) injection 0-5 Units  0-5 Units Subcutaneous QHS Manuella Ghazi, Pratik D, DO       pantoprazole (PROTONIX) EC tablet 40 mg  40 mg Oral Daily Adefeso, Oladapo, DO   40 mg at 09/20/21 0820   spironolactone (ALDACTONE) tablet 12.5 mg  12.5 mg Oral Daily Adefeso, Oladapo, DO   12.5 mg at 09/20/21 0820    Allergies as of  09/19/2021 - Review Complete 09/19/2021  Allergen Reaction Noted   Ciprofloxacin  09/30/2018   Levaquin [levofloxacin]  09/30/2018   Sulfa antibiotics  09/30/2018   Other Rash 08/03/2019    No family history on file.  Social History   Socioeconomic History   Marital status: Married    Spouse name: Not on file   Number of children: Not on file   Years of education: Not on file   Highest education level: Not on file  Occupational History   Not on file  Tobacco Use   Smoking status: Never   Smokeless tobacco: Never  Substance and Sexual Activity   Alcohol use: Never   Drug use: Never   Sexual activity: Not on file  Other Topics Concern   Not on file  Social History Narrative   Not on file   Social Determinants of Health   Financial Resource Strain: Not on file  Food Insecurity: Not on file  Transportation Needs: Not on file  Physical Activity: Not on file  Stress: Not on file  Social Connections: Not on file  Intimate Partner Violence: Not on file     Review of Systems   Gen: Denies any fever, chills, loss of appetite, change in weight or weight loss CV: Denies chest pain, heart palpitations, syncope, edema  Resp: Denies shortness of breath with rest, cough, wheezing, coughing up blood, and pleurisy. GI: see HPI GU : Denies urinary burning, blood in urine, urinary frequency, and urinary incontinence. MS: Denies joint pain, limitation of movement, swelling, cramps, and atrophy.  Derm: Denies rash, itching, dry skin, hives. Psych: Denies depression, anxiety, memory loss, hallucinations, and confusion. Heme: Denies bruising or bleeding Neuro:  Denies any headaches, dizziness, paresthesias, shaking  Physical Exam   Vital Signs in last 24 hours: Temp:  [97.7 F (36.5 C)-98.3 F (36.8 C)] 97.8 F (36.6 C) (07/14 0730) Pulse Rate:  [71-88] 79 (07/14 0730) Resp:  [16-23] 16 (07/14 0730) BP: (102-138)/(45-59) 114/47 (07/14 0730) SpO2:  [89 %-99 %] 92 % (07/14  0730) Weight:  [76.2 kg-77.5 kg] 77.5 kg (07/13 2325) Last BM Date : 09/18/21  General:   Alert,  Well-developed, well-nourished, pleasant and cooperative in NAD Head:  Normocephalic and atraumatic. Eyes:  Sclera clear, no icterus.   Conjunctiva pink. Ears:  Normal auditory acuity. Mouth:  No deformity or lesions, dentition normal. Lungs:  Clear throughout to auscultation.   No wheezes, crackles, or rhonchi. No acute distress.  Heart:  Regular rate and rhythm; no murmurs, clicks, rubs,  or gallops. Abdomen:  Soft, rounded, fluid-filled.  Not taut.  Small soft hernia noted to umbilical region.  Normal bowel sounds, without guarding, and without rebound.   Rectal:   deferred Extremities:  2 edema bilateral lower extremities Neurologic:  Alert and  oriented x4.  No asterixis present Skin:  Intact without significant lesions or rashes. Psych:  Alert and cooperative. Normal mood and affect.  Intake/Output from previous day: 07/13 0701 - 07/14 0700 In: 100 [IV Piggyback:100] Out: 1175 [Urine:1175] Intake/Output this shift: No intake/output data recorded.  Labs/Studies   Recent Labs Recent Labs    09/19/21 1850 09/20/21 0504  WBC 4.5 4.2  HGB 10.6* 10.2*  HCT 31.8* 30.7*  PLT 70* 67*   BMET Recent Labs    09/19/21 1747 09/20/21 0504  NA 135 139  K 3.3* 3.3*  CL 104 104  CO2 26 28  GLUCOSE 154* 106*  BUN 12 12  CREATININE 0.94 0.88  CALCIUM 7.7* 8.0*   LFT Recent Labs    09/19/21 1747 09/20/21 0504  PROT 5.0* 4.7*  ALBUMIN 2.5* 2.3*  AST 32 29  ALT 15 14  ALKPHOS 97 88  BILITOT 4.1* 3.4*  BILIDIR  --  1.1*   PT/INR Recent Labs    09/19/21 1747  LABPROT 21.5*  INR 1.9*   Hepatitis Panel No results for input(s): "HEPBSAG", "HCVAB", "HEPAIGM", "HEPBIGM" in the last 72 hours. C-Diff No results for input(s): "CDIFFTOX" in the last 72 hours.  Radiology/Studies DG Chest 2 View  Result Date: 09/19/2021 CLINICAL DATA:  Edema. EXAM: CHEST - 2 VIEW  COMPARISON:  Chest x-ray 09/09/2021 FINDINGS: Right chest port catheter tip projects over the SVC, unchanged. The heart size and mediastinal contours are within normal limits. Both lungs are clear. The visualized skeletal structures are unremarkable. IMPRESSION: No active cardiopulmonary disease. Electronically Signed   By: Ronney Asters M.D.   On: 09/19/2021 17:39     Assessment   Brooke Gutierrez is a 86 y.o. year old female  with history of cirrhosis, Hodgkin's disease with mixed cellularity, initial clinical stage IIIb diagnosed 2008 and treated with ABVD, type 2 diabetes, GERD, HLD, GERD, IDA, thrombocytopenia, and esophageal varices who presented to the ED with 1 month of progressive increased abdominal girth and lower extremity edema with shortness of breath.  Although she has a noted history of cirrhosis, patient does not have a primary GI physician. GI consulted for further evaluation management of cirrhosis and ascites with possible need for paracentesis.   Cirrhosis with ascites: Likely NASH etiology, reportedly diagnosed about 12 to 13 years ago.  Of note did have esophageal varices on EGD in 2016 per chart review. LFTs normal, bilirubin 4.1 (3.5 at [facility] on 09/10/2021), sodium 135, creatinine 0.94, albumin 2.5, INR 1.9 (1.5 on 09/09/21), PT 21.5, BNP 141, Hgb 10.6, MCV 96.1, platelets 70 (80).  Has had issues with dyspnea and increased lower extremity edema.  Has had 2 prior hospitalizations this year for the same.  CT chest at outside hospital noting cirrhosis with signs of portal hypertension and large amount of ascites. She was previously maintained on Lasix but dose was decreased recently due to hypokalemia and hypomagnesemia.  She has been on Aldactone 12.5 mg daily. On admission patient reported increased abdominal girth along with increased lower extremity edema.  She responded well to IV Lasix initially.  Per patient and family she has had significant improvement in her swelling  since  receiving IV diuretic.  She reported a 30 pound weight gain since her most recent discharge from outside hospital on 7/4. Swelling has gotten worse at home and sometimes it has not allowed her to walk and has been extremely painful. With her increasing fluid she develops dyspnea.  No signs of hepatic encephalopathy. Patient has been denying any abdominal pain, nausea, vomiting, fever, or chills. We will schedule paracentesis today, likely will need to increase Aldactone.  She will need early interval follow-up outpatient to assess fluid retention and modification of diuretics versus receiving frequent paracentesis. Also will do formal abdominal imaging with ultrasound and doppler We will check labs for SBP.  MELD Na: 22.   We will need to set up outpatient abdominal ultrasound and liver doppler upon follow up. Will also check AFP.   Hypokalemia and hypomagnesemia: Potassium 3.3 on admission, replaced.  Likely secondary to diuresis.  Magnesium 1.3, replaced in the ED.   Plan / Recommendations   Continue Lasix 20 mg daily Increase Aldactone to 25 mg daily US Paracentesis today - labs and albumin ordered MELD labs daily - CBC, CMP, PT/INR Check AFP Continue PPI daily Strict weights Follow 2g sodium diet.  Abdominal ultrasound with Liver doppler outpatient.  Will need 1-2 week f/u with our office on discharge.     09/20/2021, 9:54 AM  Venetia Night, MSN, FNP-BC, AGACNP-BC [facility]

## 2021-09-20 NOTE — Procedures (Signed)
PreOperative Dx: Cirrhosis due to NASH, ascites Postoperative Dx: Cirrhosis due to NASH, ascites Procedure:   US guided paracentesis Radiologist:  Thornton Papas Anesthesia:  10 ml of1% lidocaine Specimen:  2.3 L of clear yellow ascitic fluid EBL:   < 1 ml Complications: None

## 2021-09-20 NOTE — Progress Notes (Signed)
PROGRESS NOTE    Brooke Gutierrez  PYP:[id number] DOB: 1935-09-11 DOA: 09/19/2021 PCP: Neale Burly, MD   Brief Narrative:    Brooke Gutierrez is a 86 y.o. 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  Patient was admitted with ascites and bilateral lower extremity edema that is recurrent in nature given her NASH cirrhosis.  Assessment & Plan:   Principal Problem:   Ascites Active Problems:   Liver cirrhosis secondary to NASH (nonalcoholic steatohepatitis) (HCC)   Type 2 diabetes mellitus (HCC)   Elevated brain natriuretic peptide (BNP) level   Hypokalemia/Hypomagnesemia-due to Diarrhea and Diuretics   Prolonged QT interval   Mixed hyperlipidemia   Lower extremity edema   Hyperbilirubinemia   Acute respiratory failure with hypoxia (HCC)  Assessment and Plan:  Ascites and bilateral lower extremity edema possibly due to hepatic cirrhosis with ascites due to NASH IV Lasix 20 mg x 1 was given with positive diuresis Continue Lasix 20 mg daily at this time Continue spironolactone 12.5 mg daily Consider paracentesis for worsening abdominal distention and discomfort; may need this scheduled outpatient -Appreciate GI evaluation and recommendations   Acute respiratory failure with hypoxia in the setting of above This was possibly due to abdominal distention secondary to the ascites resulting in hardening of the abdominal wall.  There was a slight improvement in distention after Lasix administration slight improvement in work of breathing. Continue supplemental oxygen to maintain O2 sats > 94% and wean  patient off this as tolerated (patient does not use supplemental oxygen at baseline)   Chronic thrombocytopenia Platelets 67, continue to monitor platelet levels with morning labs  Hypokalemia This is possibly due to diuresis -Continue to replete and monitor   Prolonged QTc (499 ms) Avoid QT prolonging drugs Magnesium and potassium levels will be replenished Continue telemetry  Type 2 diabetes mellitus with hyperglycemia Continue ISS and hypoglycemic protocol  Hypoalbuminemia possibly secondary to moderate protein calorie malnutrition Albumin 2.5, protein supplement will be provided  Hyperbilirubinemia possibly due to liver cirrhosis Total bilirubin 4.1, direct bilirubin will be checked  Elevated BNP (chronic) Stable.  BNP was 141 (this was 144 about 6 months ago).  Mixed hyperlipidemia Continue on Pravachol  GERD Continue Protonix  Obesity current BMI 30.27 kg/m) Diet and lifestyle modification   History of Hodgkin's disease Continue outpatient surveillance    DVT prophylaxis: SCDs Code Status: Full Family Communication:None at bedside  Disposition Plan:  Status is: Observation The patient will require care spanning > 2 midnights and should be moved to inpatient because: IV meds.  Consultants:  GI  Procedures:  None  Antimicrobials:  None   Subjective: Patient seen and evaluated today with no new acute complaints or concerns. No acute concerns or events noted overnight.  She states that she is starting to feel better after diuresis and denies any further dyspnea.  Objective: Vitals:   09/19/21 2325 09/19/21 2331 09/20/21 0314 09/20/21 0730  BP:  (!) 135/50 (!) 102/47 (!) 114/47  Pulse:  88 80 79  Resp:  '19 20 16  '$ Temp:  97.7 F (36.5 C) 97.7 F (36.5 C) 97.8 F (36.6 C)  TempSrc:  Oral Oral Oral  SpO2:  95% 95%  92%  Weight: 77.5 kg     Height: '5\' 3"'$  (1.6 m)       Intake/Output Summary (Last 24 hours) at 09/20/2021 1130 Last data filed at  09/20/2021 0911 Gross per 24 hour  Intake 100 ml  Output 1375 ml  Net -1275 ml   Filed Weights   09/19/21 1655 09/19/21 2325  Weight: 76.2 kg 77.5 kg    Examination:  General exam: Appears calm and comfortable, ascites Respiratory system: Clear to auscultation. Respiratory effort normal. Cardiovascular system: S1 & S2 heard, RRR.  Gastrointestinal system: Abdomen is soft, nontender and nondistended Central nervous system: Alert and awake Extremities: Bilateral 1-2+ pitting edema noted Skin: No significant lesions noted Psychiatry: Flat affect.    Data Reviewed: I have personally reviewed following labs and imaging studies  CBC: Recent Labs  Lab 09/19/21 1850 09/20/21 0504  WBC 4.5 4.2  NEUTROABS 3.2  --   HGB 10.6* 10.2*  HCT 31.8* 30.7*  MCV 96.1 96.5  PLT 70* 67*   Basic Metabolic Panel: Recent Labs  Lab 09/19/21 1747 09/19/21 1903 09/20/21 0504  NA 135  --  139  K 3.3*  --  3.3*  CL 104  --  104  CO2 26  --  28  GLUCOSE 154*  --  106*  BUN 12  --  12  CREATININE 0.94  --  0.88  CALCIUM 7.7*  --  8.0*  MG  --  1.3* 2.1  PHOS  --   --  2.9   GFR: Estimated Creatinine Clearance: 46.9 mL/min (by C-G formula based on SCr of 0.88 mg/dL). Liver Function Tests: Recent Labs  Lab 09/19/21 1747 09/20/21 0504  AST 32 29  ALT 15 14  ALKPHOS 97 88  BILITOT 4.1* 3.4*  PROT 5.0* 4.7*  ALBUMIN 2.5* 2.3*   No results for input(s): "LIPASE", "AMYLASE" in the last 168 hours. No results for input(s): "AMMONIA" in the last 168 hours. Coagulation Profile: Recent Labs  Lab 09/19/21 1747  INR 1.9*   Cardiac Enzymes: No results for input(s): "CKTOTAL", "CKMB", "CKMBINDEX", "TROPONINI" in the last 168 hours. BNP (last 3 results) No results for input(s): "PROBNP" in the last 8760 hours. HbA1C: Recent Labs    09/20/21 0504  HGBA1C 4.4*   CBG: Recent Labs  Lab 09/19/21 2332 09/20/21 0407 09/20/21 0734 09/20/21 1120  GLUCAP 134* 125* 98 157*   Lipid  Profile: No results for input(s): "CHOL", "HDL", "LDLCALC", "TRIG", "CHOLHDL", "LDLDIRECT" in the last 72 hours. Thyroid Function Tests: No results for input(s): "TSH", "T4TOTAL", "FREET4", "T3FREE", "THYROIDAB" in the last 72 hours. Anemia Panel: No results for input(s): "VITAMINB12", "FOLATE", "FERRITIN", "TIBC", "IRON", "RETICCTPCT" in the last 72 hours. Sepsis Labs: No results for input(s): "PROCALCITON", "LATICACIDVEN" in the last 168 hours.  No results found for this or any previous visit (from the past 240 hour(s)).       Radiology Studies: DG Chest 2 View  Result Date: 09/19/2021 CLINICAL DATA:  Edema. EXAM: CHEST - 2 VIEW COMPARISON:  Chest x-ray 09/09/2021 FINDINGS: Right chest port catheter tip projects over the SVC, unchanged. The heart size and mediastinal contours are within normal limits. Both lungs are clear. The visualized skeletal structures are unremarkable. IMPRESSION: No active cardiopulmonary disease. Electronically Signed   By: Ronney Asters M.D.   On: 09/19/2021 17:39        Scheduled Meds:  Chlorhexidine Gluconate Cloth  6 each Topical Daily   feeding supplement (GLUCERNA SHAKE)  237 mL Oral TID  BM   furosemide  20 mg Intravenous Daily   ibuprofen  400 mg Oral Once   insulin aspart  0-15 Units Subcutaneous TID WC   insulin aspart  0-5 Units Subcutaneous QHS   pantoprazole  40 mg Oral Daily   potassium chloride  40 mEq Oral Once   spironolactone  12.5 mg Oral Daily     LOS: 0 days    Time spent: 35 minutes    Hilde Churchman Darleen Crocker, DO [facility]  If 7PM-7AM, please contact night-coverage [email or web address] 09/20/2021, 11:30 AM

## 2021-09-20 NOTE — Progress Notes (Signed)
  Transition of Care [facility]) Screening Note   Patient Details  Name: NYLAH BUTKUS Date of Birth: Dec 16, 1935   Transition of Care [facility]) CM/SW Contact:    Ihor Gully, LCSW Phone Number: 09/20/2021, 12:32 PM    Transition of Care Department [facility]) has reviewed patient and no TOC needs have been identified at this time. We will continue to monitor patient advancement through interdisciplinary progression rounds. If new patient transition needs arise, please place a TOC consult.

## 2021-09-21 ENCOUNTER — Telehealth: Payer: Self-pay | Admitting: Internal Medicine

## 2021-09-21 DIAGNOSIS — R188 Other ascites: Secondary | ICD-10-CM | POA: Diagnosis not present

## 2021-09-21 LAB — BASIC METABOLIC PANEL
Anion gap: 7 (ref 5–15)
BUN: 13 mg/dL (ref 8–23)
CO2: 27 mmol/L (ref 22–32)
Calcium: 8.2 mg/dL — ABNORMAL LOW (ref 8.9–10.3)
Chloride: 107 mmol/L (ref 98–111)
Creatinine, Ser: 0.9 mg/dL (ref 0.44–1.00)
GFR, Estimated: >60 mL/min (ref >60)
Glucose, Bld: 153 mg/dL — ABNORMAL HIGH (ref 70–99)
Potassium: 3.9 mmol/L (ref 3.5–5.1)
Sodium: 141 mmol/L (ref 135–145)

## 2021-09-21 LAB — HEPATIC FUNCTION PANEL
ALT: 13 U/L (ref 0–44)
AST: 30 U/L (ref 15–41)
Albumin: 2.3 g/dL — ABNORMAL LOW (ref 3.5–5.0)
Alkaline Phosphatase: 93 U/L (ref 38–126)
Bilirubin, Direct: 1 mg/dL — ABNORMAL HIGH (ref 0.0–0.2)
Indirect Bilirubin: 2.3 mg/dL — ABNORMAL HIGH (ref 0.3–0.9)
Total Bilirubin: 3.3 mg/dL — ABNORMAL HIGH (ref 0.3–1.2)
Total Protein: 4.6 g/dL — ABNORMAL LOW (ref 6.5–8.1)

## 2021-09-21 LAB — CBC
HCT: 32.4 % — ABNORMAL LOW (ref 36.0–46.0)
Hemoglobin: 10.5 g/dL — ABNORMAL LOW (ref 12.0–15.0)
MCH: 31.7 pg (ref 26.0–34.0)
MCHC: 32.4 g/dL (ref 30.0–36.0)
MCV: 97.9 fL (ref 80.0–100.0)
Platelets: 71 10*3/uL — ABNORMAL LOW (ref 150–400)
RBC: 3.31 MIL/uL — ABNORMAL LOW (ref 3.87–5.11)
RDW: 19.9 % — ABNORMAL HIGH (ref 11.5–15.5)
WBC: 5.2 10*3/uL (ref 4.0–10.5)
nRBC: 0 % (ref 0.0–0.2)

## 2021-09-21 LAB — GLUCOSE, CAPILLARY: Glucose-Capillary: 134 mg/dL — ABNORMAL HIGH (ref 70–99)

## 2021-09-21 LAB — PROTIME-INR
INR: 1.8 — ABNORMAL HIGH (ref 0.8–1.2)
Prothrombin Time: 20.8 seconds — ABNORMAL HIGH (ref 11.4–15.2)

## 2021-09-21 LAB — MAGNESIUM: Magnesium: 1.6 mg/dL — ABNORMAL LOW (ref 1.7–2.4)

## 2021-09-21 MED ORDER — HEPARIN SOD (PORK) LOCK FLUSH 100 UNIT/ML IV SOLN
500.0000 [IU] | Freq: Once | INTRAVENOUS | Status: DC
  Filled 2021-09-21: qty 5

## 2021-09-21 MED ORDER — SPIRONOLACTONE 25 MG PO TABS: 25.0000 mg | ORAL_TABLET | Freq: Every day | ORAL | 4 refills | Status: DC

## 2021-09-21 MED ORDER — GLUCERNA SHAKE PO LIQD: 237.0000 mL | Freq: Three times a day (TID) | ORAL | 0 refills | Status: AC

## 2021-09-21 MED ORDER — MAGNESIUM SULFATE 2 GM/50ML IV SOLN
2.0000 g | Freq: Once | INTRAVENOUS | Status: AC
  Administered 2021-09-21: 2 g via INTRAVENOUS
  Filled 2021-09-21: qty 50

## 2021-09-21 NOTE — Telephone Encounter (Signed)
Patient seen this morning prior to discharge.  Feeling well.  Much less abdominal distention status post tap yesterday.  Cell count negative for SBP.  Lengthy discussion with patient regarding cirrhosis care as an outpatient.  Discussed with Katha Cabal, her daughter who was in attendance.  Also discussed electronically Dr. Brigitte Pulse  Agree with going home on Aldactone 25 mg daily Lasix 20 mg daily.  Continue Coreg daily.  2 g sodium diet.  Patient to weigh herself every day if she gains weight spontaneously she is to let me know we will check a serum magnesium and be met in 1 week.  We will arrange office follow-up at [facility] in about 2 weeks.  Will need hepatic ultrasound, AFP and Doppler studies as an outpatient.

## 2021-09-21 NOTE — Discharge Summary (Signed)
Physician Discharge Summary  Brooke Gutierrez TDD:[id number] DOB: Oct 31, 1935 DOA: 09/19/2021  PCP: Neale Burly, MD  Admit date: 09/19/2021  Discharge date: 09/21/2021  Admitted From:Home  Disposition:  Home  Recommendations for Outpatient Follow-up:  Follow up with PCP in 1-2 weeks Please obtain BMP and Mg repeat in 1 week Follow-up with gastroenterology which will be scheduled in the next 2 weeks to establish cirrhosis care and further care Continue increased dose of spironolactone of 25 mg daily and continue Lasix and Coreg as prior  Home Health: None  Equipment/Devices: None  Discharge Condition:Stable  CODE STATUS: Full  Diet recommendation: Heart Healthy/carb modified, limit sodium to 2 g daily  Brief/Interim Summary: Brooke Gutierrez is a 86 y.o. 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  Patient was admitted with ascites and bilateral lower extremity edema that is recurrent in nature given her NASH cirrhosis.  She does not currently see a gastroenterologist for cirrhosis care and has been seen by GI while inpatient with recommendations to increase spironolactone dose to 25 mg daily.  She did have paracentesis on 7/14 with 2.1 L of fluid removed.  She overall feels much improved and is stable for discharge today per GI. They will follow for further care in the next 2 weeks. No acute events while inpatient.  Discharge Diagnoses:  Principal Problem:   Ascites Active Problems:   Liver cirrhosis secondary to NASH (nonalcoholic steatohepatitis) (HCC)   Type 2 diabetes mellitus (HCC)   Elevated  brain natriuretic peptide (BNP) level   Hypokalemia/Hypomagnesemia-due to Diarrhea and Diuretics   Prolonged QT interval   Mixed hyperlipidemia   Lower extremity edema   Hyperbilirubinemia   Acute respiratory failure with hypoxia ([place])  Principal discharge diagnosis: Ascites and bilateral lower extremity edema secondary to hepatic cirrhosis/NASH.  Discharge Instructions  Discharge Instructions     Diet - low sodium heart healthy   Complete by: As directed    Increase activity slowly   Complete by: As directed       Allergies as of 09/21/2021       Reactions   Ciprofloxacin    Levaquin [levofloxacin]    Sulfa Antibiotics    Other Rash   Adhesive tape        Medication List     STOP taking these medications    cefdinir 300 MG capsule Commonly known as: OMNICEF       TAKE these medications    carvedilol 3.125 MG tablet Commonly known as: COREG Take 1 tablet (3.125 mg total) by mouth 2 (two) times daily. What changed: how much to take   chlorhexidine 0.12 % solution Commonly known as: PERIDEX SMARTSIG:By Mouth   clonazePAM 0.5 MG tablet Commonly known as: KLONOPIN Take 0.5 mg by mouth daily as needed.   feeding supplement (GLUCERNA SHAKE) Liqd Take 237 mLs by mouth 3 (three) times daily between meals.   ferrous sulfate 325 (65 FE) MG tablet Take 325 mg by mouth daily with breakfast.   furosemide 20 MG tablet Commonly known as: LASIX Take 1 tablet (20 mg total) by mouth every Monday, Wednesday, and Friday.   insulin glargine 100 UNIT/ML injection Commonly known as: LANTUS Inject 5-15  Units into the skin at bedtime. Sliding scale   insulin lispro 100 UNIT/ML injection Commonly known as: HUMALOG Inject 10 Units into the skin 3 (three) times daily with meals. Sliding scale   potassium chloride 10 MEQ tablet Commonly known as: KLOR-CON Take 1 tablet (10 mEq total) by mouth daily. Take While taking Lasix/furosemide   pravastatin 20 MG  tablet Commonly known as: PRAVACHOL Take 20 mg by mouth every evening.   spironolactone 25 MG tablet Commonly known as: Aldactone Take 1 tablet (25 mg total) by mouth daily. What changed: how much to take        Follow-up Information     Hasanaj, Samul Dada, MD. Schedule an appointment as soon as possible for a visit in 1 week(s).   Specialty: Internal Medicine Contact information: [place] [address] 315 [phone]                Allergies  Allergen Reactions   Ciprofloxacin    Levaquin [Levofloxacin]    Sulfa Antibiotics    Other Rash    Adhesive tape    Consultations: GI   Procedures/Studies: US Paracentesis  Result Date: 09/20/2021 INDICATION: NASH, cirrhosis, ascites, diabetes mellitus, CHF, COPD EXAM: ULTRASOUND GUIDED DIAGNOSTIC AND THERAPEUTIC PARACENTESIS MEDICATIONS: None. COMPLICATIONS: None immediate. PROCEDURE: Informed written consent was obtained from the patient after a discussion of the risks, benefits and alternatives to treatment. A timeout was performed prior to the initiation of the procedure. Initial ultrasound scanning demonstrates a large amount of ascites within the right lower abdominal quadrant. The right lower abdomen was prepped and draped in the usual sterile fashion. 1% lidocaine was used for local anesthesia. Following this, a 5 French catheter was introduced. An ultrasound image was saved for documentation purposes. The paracentesis was performed. The catheter was removed and a dressing was applied. The patient tolerated the procedure well without immediate post procedural complication. FINDINGS: A total of approximately 2.3 L of clear yellow ascitic fluid was removed. Samples were sent to the laboratory as requested by the clinical team. IMPRESSION: Successful ultrasound-guided paracentesis yielding 2.3 liters of peritoneal fluid. Electronically Signed   By: Lavonia Dana M.D.   On: 09/20/2021 15:18   DG Chest 2  View  Result Date: 09/19/2021 CLINICAL DATA:  Edema. EXAM: CHEST - 2 VIEW COMPARISON:  Chest x-ray 09/09/2021 FINDINGS: Right chest port catheter tip projects over the SVC, unchanged. The heart size and mediastinal contours are within normal limits. Both lungs are clear. The visualized skeletal structures are unremarkable. IMPRESSION: No active cardiopulmonary disease. Electronically Signed   By: Ronney Asters M.D.   On: 09/19/2021 17:39     Discharge Exam: Vitals:   09/20/21 2050 09/21/21 0454  BP: (!) 107/46 (!) 118/54  Pulse: 75 79  Resp: 18 20  Temp: 98.2 F (36.8 C) 98.1 F (36.7 C)  SpO2: 94% 91%   Vitals:   09/20/21 1450 09/20/21 2050 09/21/21 0454 09/21/21 0619  BP: (!) 125/52 (!) 107/46 (!) 118/54   Pulse: 76 75 79   Resp: '18 18 20   '$ Temp:  98.2 F (36.8 C) 98.1 F (36.7 C)   TempSrc:  Oral Oral   SpO2: 91% 94% 91%   Weight:    76.6 kg  Height:        General: Pt is alert, awake, not in acute distress Cardiovascular: RRR, S1/S2 +, no rubs, no gallops Respiratory: CTA bilaterally, no wheezing, no rhonchi Abdominal: Soft, NT, ND, bowel sounds + Extremities:  no edema, no cyanosis    The results of significant diagnostics from this hospitalization (including imaging, microbiology, ancillary and laboratory) are listed below for reference.     Microbiology: Recent Results (from the past 240 hour(s))  Culture, body fluid w Gram Stain-bottle     Status: None (Preliminary result)   Collection Time: 09/20/21  2:20 PM   Specimen: Peritoneal Washings  Result Value Ref Range Status   Specimen Description PERITONEAL  Final   Special Requests BOTTLES DRAWN AEROBIC AND ANAEROBIC 10 CC  Final   Culture   Final    NO GROWTH < 12 HOURS Performed at [facility], [address], [place], [place] [address]    Report Status PENDING  Incomplete  Gram stain     Status: None   Collection Time: 09/20/21  2:20 PM   Specimen: Peritoneal Washings  Result Value Ref Range Status    Specimen Description PERITONEAL  Final   Special Requests NONE  Final   Gram Stain   Final    NO ORGANISMS SEEN WBC PRESENT,BOTH PMN AND MONONUCLEAR CYTOSPIN SMEAR PERFORMED AT [facility] Performed at [facility], [address] [place], [place] [address]    Report Status 09/20/2021 FINAL  Final     Labs: BNP (last 3 results) Recent Labs    03/21/21 1605 09/19/21 1830  BNP 144.0* 287.8*   Basic Metabolic Panel: Recent Labs  Lab 09/19/21 1747 09/19/21 1903 09/20/21 0504 09/21/21 0506  NA 135  --  139 141  K 3.3*  --  3.3* 3.9  CL 104  --  104 107  CO2 26  --  28 27  GLUCOSE 154*  --  106* 153*  BUN 12  --  12 13  CREATININE 0.94  --  0.88 0.90  CALCIUM 7.7*  --  8.0* 8.2*  MG  --  1.3* 2.1 1.6*  PHOS  --   --  2.9  --    Liver Function Tests: Recent Labs  Lab 09/19/21 1747 09/20/21 0504 09/21/21 0506  AST 32 29 30  ALT '15 14 13  '$ ALKPHOS 97 88 93  BILITOT 4.1* 3.4* 3.3*  PROT 5.0* 4.7* 4.6*  ALBUMIN 2.5* 2.3* 2.3*   No results for input(s): "LIPASE", "AMYLASE" in the last 168 hours. No results for input(s): "AMMONIA" in the last 168 hours. CBC: Recent Labs  Lab 09/19/21 1850 09/20/21 0504 09/21/21 0506  WBC 4.5 4.2 5.2  NEUTROABS 3.2  --   --   HGB 10.6* 10.2* 10.5*  HCT 31.8* 30.7* 32.4*  MCV 96.1 96.5 97.9  PLT 70* 67* 71*   Cardiac Enzymes: No results for input(s): "CKTOTAL", "CKMB", "CKMBINDEX", "TROPONINI" in the last 168 hours. BNP: Invalid input(s): "POCBNP" CBG: Recent Labs  Lab 09/20/21 0734 09/20/21 1120 09/20/21 1628 09/20/21 2053 09/21/21 0752  GLUCAP 98 157* 134* 138* 134*   D-Dimer No results for input(s): "DDIMER" in the last 72 hours. Hgb A1c Recent Labs    09/20/21 0504  HGBA1C 4.4*   Lipid Profile No results for input(s): "CHOL", "HDL", "LDLCALC", "TRIG", "CHOLHDL", "LDLDIRECT" in the last 72 hours. Thyroid function studies No results for input(s): "TSH", "T4TOTAL", "T3FREE", "THYROIDAB" in the last 72  hours.  Invalid input(s): "FREET3" Anemia work up No results for input(s): "VITAMINB12", "FOLATE", "FERRITIN", "TIBC", "IRON", "RETICCTPCT" in the last 72 hours. Urinalysis    Component Value Date/Time   COLORURINE YELLOW 09/19/2021 1949   APPEARANCEUR CLEAR 09/19/2021 1949   LABSPEC 1.006 09/19/2021 1949  PHURINE 5.0 09/19/2021 1949   GLUCOSEU NEGATIVE 09/19/2021 1949   HGBUR MODERATE (A) 09/19/2021 1949   BILIRUBINUR NEGATIVE 09/19/2021 1949   KETONESUR NEGATIVE 09/19/2021 1949   PROTEINUR NEGATIVE 09/19/2021 1949   NITRITE NEGATIVE 09/19/2021 1949   LEUKOCYTESUR LARGE (A) 09/19/2021 1949   Sepsis Labs Recent Labs  Lab 09/19/21 1850 09/20/21 0504 09/21/21 0506  WBC 4.5 4.2 5.2   Microbiology Recent Results (from the past 240 hour(s))  Culture, body fluid w Gram Stain-bottle     Status: None (Preliminary result)   Collection Time: 09/20/21  2:20 PM   Specimen: Peritoneal Washings  Result Value Ref Range Status   Specimen Description PERITONEAL  Final   Special Requests BOTTLES DRAWN AEROBIC AND ANAEROBIC 10 CC  Final   Culture   Final    NO GROWTH < 12 HOURS Performed at [facility], [address] [place], [place] [address]    Report Status PENDING  Incomplete  Gram stain     Status: None   Collection Time: 09/20/21  2:20 PM   Specimen: Peritoneal Washings  Result Value Ref Range Status   Specimen Description PERITONEAL  Final   Special Requests NONE  Final   Gram Stain   Final    NO ORGANISMS SEEN WBC PRESENT,BOTH PMN AND MONONUCLEAR CYTOSPIN SMEAR PERFORMED AT [facility] Performed at [facility], [address] [place], [place] [address]    Report Status 09/20/2021 FINAL  Final     Time coordinating discharge: 35 minutes  SIGNED:   Rodena Goldmann, DO [facility] 09/21/2021, 10:46 AM  If 7PM-7AM, please contact night-coverage [email or web address]

## 2021-09-22 LAB — AFP TUMOR MARKER: AFP, Serum, Tumor Marker: <1.8 ng/mL (ref 0.0–8.7)

## 2021-09-23 ENCOUNTER — Encounter: Payer: Self-pay | Admitting: Internal Medicine

## 2021-09-24 ENCOUNTER — Other Ambulatory Visit: Payer: Self-pay

## 2021-09-24 ENCOUNTER — Telehealth: Payer: Self-pay | Admitting: Internal Medicine

## 2021-09-24 DIAGNOSIS — K7460 Unspecified cirrhosis of liver: Secondary | ICD-10-CM

## 2021-09-24 LAB — CYTOLOGY - NON PAP

## 2021-09-24 NOTE — Telephone Encounter (Signed)
Patient needs another ultrasound order placed and needs to be scheduled   please call the daughter Hanley Ben at [phone]

## 2021-09-24 NOTE — Telephone Encounter (Signed)
I don't have anything for this patient needing [place]. Fowarding to [employer]

## 2021-09-24 NOTE — Telephone Encounter (Signed)
Labs were ordered and pt was made aware to have them completed on 09/27/21. Routing to front to get follow up appt in two weeks with Loma Sousa.

## 2021-09-25 ENCOUNTER — Ambulatory Visit: Payer: Medicare Other | Admitting: Gastroenterology

## 2021-09-25 ENCOUNTER — Encounter: Payer: Self-pay | Admitting: Gastroenterology

## 2021-09-25 VITALS — BP 122/75 | HR 78 | Temp 97.9°F | Ht 62.0 in | Wt 175.0 lb

## 2021-09-25 DIAGNOSIS — R188 Other ascites: Secondary | ICD-10-CM | POA: Diagnosis not present

## 2021-09-25 DIAGNOSIS — E876 Hypokalemia: Secondary | ICD-10-CM | POA: Diagnosis not present

## 2021-09-25 DIAGNOSIS — K7460 Unspecified cirrhosis of liver: Secondary | ICD-10-CM | POA: Diagnosis not present

## 2021-09-25 NOTE — Patient Instructions (Addendum)
Please have blood work completed at [facility]. I am rechecking your electrolytes and screening for viral hepatitis as well as checking your immunity status.  We will arrange for you to have an ultrasound of your liver at [facility].   Continue Lasix 20 mg every other day and spironolactone 25 mg daily for now.  I will have additional recommendations for you after you have completed your blood work.  Continue to follow a strict low-sodium diet.  No more than 2000 mg per 24 hours.   Nutrition recommendations:  High-protein diet from a primarily plant-based diet. Avoid red meat.  No raw or undercooked meat, seafood, or shellfish. Low-fat/cholesterol/carbohydrate diet.  Continue to avoid alcohol.  You may use Tylenol if needed.  No more than 2000 mg per 24 hours.  If you notice your abdomen is getting distended and tight and you feel you need another paracentesis to have fluid drawn off, please let me know.   We will plan to see back in the office in 2 weeks.  Aliene Altes, PA-C [facility]

## 2021-09-25 NOTE — Progress Notes (Signed)
Referring Provider: Neale Burly, MD Primary Care Physician:  Neale Burly, MD Primary GI Physician: Dr. Gala Romney  Chief Complaint  Patient presents with   Follow-up    HPI:   Brooke Gutierrez is a 86 y.o. female with history of cirrhosis, thrombocytopenia, esophageal varices, Hodgkin's disease with mixed cellularity, initial clinical stage IIIb diagnosed 2008 and treated with ABVD, type 2 diabetes, HLD, COPD, presenting today for hospital follow-up of volume overload.   Patient was admitted 7/13 - 7/15 with ascites, peripheral edema, shortness of breath.  She reported symptoms have been progressive over 1 month. Noted 2 recent admissions this year for dyspnea and possible acute on chronic congestive heart failure and lower extremity edema. Most recent mission was at [facility] on 7/3-7/4 for bilateral lower extremity edema. She was diuresed with IV lasix and electrolytes corrected. Patient reported she had been on PO lasix, but her dose was decreased due to severe hypokalemia and hypomagnesemia. She was taking Lasix every other day and oral K. Also on 12.5 Aldactone daily. Unfortunately with this dose, she had rapid return of fluid retention with about 30 pound weight gain following her discharge which is why she presented again to [facility]. She was found to have ascites and peripheral edema on exam. K 3.3, magnesium 1.3. She was diuresed with IV lasix and electrolytes corrected.  Paracentesis yielding 2.3 L. Negative for SBP. We recommended Lasix 20 mg daily but appears she was discharged on home Lasix 20 mg 3 times weekly. Aldactone increased to 25 mg daily. Recommended recheck electrolytes in 1 week, follow-up in 2 weeks, abdominal US with doppler outpatient.   Per Care Everywhere, looks like patient had EGD in 2016 with [facility]. Per oncology note on 09/21/2014, she had EGD and colonoscopy with esophageal/gastric varices, gastritis. Op notes are not available. 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 Laural Golden in 2012.    Today:  Feeling fairly well since hospital discharge. Reports peripheral edema and abdominal girth about the same. No significant worsening. In  comparing weight from the hospital to today, looks like she has gained 7 lbs, but difficult to compare these as she was weighed in the bed. She hasn't been weighing herself at home. SOB is at baseline, only with getting up and walking around. No SOB at rest which she had when she went to the ED.   Following a low sodium diet. Very careful about this.    Taking Lasix 20 mg every other day and spironolactone 25 mg daily. Takes potassium 10 meq daily.   No abdominal pain, nausea, vomiting, mental status changes, brbpr, melena, bowel habit changes.    Reports having GI bleed back in 2016 when she had EGD and was found to have varices. States varices were banded.    Not sure if she received Hep A or B vaccination previously. Reports cirrhosis was diagnosis 12-13 years ago with workup unrevealing and confirmed NASH cirrhosis.    *Had normal echocardiogram December 2022 (Care Everywhere).  Past Medical History:  Diagnosis Date   CHF (congestive heart failure) ([place])    ?normal ECHO 02/2021   Cirrhosis ([place])    COPD (chronic obstructive pulmonary disease) (HCC)    Diabetes mellitus without complication (HCC)    HLD (hyperlipidemia)     Past Surgical History:  Procedure Laterality Date   ABDOMINAL HYSTERECTOMY     APPENDECTOMY     BLADDER REPAIR  CESAREAN SECTION     COLONOSCOPY     ESOPHAGOGASTRODUODENOSCOPY     GALLBLADDER SURGERY      Current Outpatient Medications  Medication Sig Dispense Refill   carvedilol (COREG) 3.125 MG tablet Take 1 tablet (3.125 mg total) by mouth 2 (two) times daily. (Patient taking differently: Take 6.25 mg by mouth 2 (two) times daily.) 60 tablet 1   clonazePAM (KLONOPIN) 0.5 MG tablet  Take 0.5 mg by mouth daily as needed.     feeding supplement, GLUCERNA SHAKE, (GLUCERNA SHAKE) LIQD Take 237 mLs by mouth 3 (three) times daily between meals. 21330 mL 0   ferrous sulfate 325 (65 FE) MG tablet Take 325 mg by mouth daily with breakfast.     furosemide (LASIX) 20 MG tablet Take 1 tablet (20 mg total) by mouth every Monday, Wednesday, and Friday. 30 tablet 1   insulin glargine (LANTUS) 100 UNIT/ML injection Inject 5-15 Units into the skin at bedtime. Sliding scale     insulin lispro (HUMALOG) 100 UNIT/ML injection Inject 10 Units into the skin 3 (three) times daily with meals. Sliding scale     potassium chloride (KLOR-CON) 10 MEQ tablet Take 1 tablet (10 mEq total) by mouth daily. Take While taking Lasix/furosemide 30 tablet 2   pravastatin (PRAVACHOL) 20 MG tablet Take 20 mg by mouth every evening.     spironolactone (ALDACTONE) 25 MG tablet Take 1 tablet (25 mg total) by mouth daily. 30 tablet 4   chlorhexidine (PERIDEX) 0.12 % solution SMARTSIG:By Mouth     No current facility-administered medications for this visit.    Allergies as of 09/25/2021 - Review Complete 09/25/2021  Allergen Reaction Noted   Ciprofloxacin  09/30/2018   Levaquin [levofloxacin]  09/30/2018   Sulfa antibiotics  09/30/2018   Other Rash 08/03/2019    History reviewed. No pertinent family history.  Social History   Socioeconomic History   Marital status: Married    Spouse name: Not on file   Number of children: Not on file   Years of education: Not on file   Highest education level: Not on file  Occupational History   Not on file  Tobacco Use   Smoking status: Never   Smokeless tobacco: Never  Vaping Use   Vaping Use: Never used  Substance and Sexual Activity   Alcohol use: Never   Drug use: Never   Sexual activity: Not on file  Other Topics Concern   Not on file  Social History Narrative   Not on file   Social Determinants of Health   Financial Resource Strain: Not on file   Food Insecurity: Not on file  Transportation Needs: Not on file  Physical Activity: Not on file  Stress: Not on file  Social Connections: Not on file    Review of Systems: Gen: Denies fever, chills, cold or flulike symptoms, presyncope, syncope. CV: Denies chest pain, palpitations.  Resp: Admits to shortness of breath with little exertion.  No shortness of breath at rest.  No cough. GI: See HPI. Heme: See HPI.  Physical Exam: BP 122/75 (BP Location: Left Arm, Patient Position: Sitting, Cuff Size: Normal)   Pulse 78   Temp 97.9 F (36.6 C) (Temporal)   Ht '5\' 2"'$  (1.575 m)   Wt 175 lb (79.4 kg)   BMI 32.01 kg/m  General:   Alert and oriented. Pleasant and cooperative. NAD. Frail. Using a walker. Unable to get on exam table.  Head:  Normocephalic and atraumatic. Eyes:  Conjuctiva clear without  scleral icterus. Heart:  S1, S2 present without murmurs appreciated. Lungs:  Clear to auscultation bilaterally. No wheezes, rales, or rhonchi. No distress.  Abdomen:  +BS, full but soft, non-tender. No rebound or guarding. No HSM or masses noted, but exam limited due to being in chair.  Msk:  Symmetrical without gross deformities. Normal posture. Extremities:  With 2+ pitting edema up to top of thighs. Per patient this was worse on hospital admission and about the same since discharge.  Neurologic:  Alert and  oriented x4 Psych:  Normal mood and affect.   Assessment:  86 y.o. female with history of cirrhosis, thrombocytopenia, esophageal varices, GERD,  Hodgkin's disease with mixed cellularity, initial clinical stage IIIb diagnosed 2008 and treated with ABVD, type 2 diabetes, HLD, COPD, presenting today for hospital follow-up s/p recent admission 7/13-7/15 for volume overload with peripheral edema, ascites, shortness of breath along with hypokalemia and hypomagnesemia. She was given IV lasix, underwent paracentesis yielding 2.3 L, and discharged on home Lasix 20 mg 3 times weekly,  spironolactone increased from 12.5 to 25 mg daily.   NASH Cirrhosis with ascites:  MELD Na 18. Child Pugh C. Complicated by coagulopathy thrombocytopenia, elevated INR, esophageal varices with banding in 2016 per patient (no surgical report on file), ascites, peripheral edema, and limited ability to increase diuretics due to electrolyte abnormalities. Overall, she reports symptoms are stable since hospital discharge. Abdominal girth without significant change. Continues with peripheral edema up to hips, stable since discharge. She has been compliant with Lasix (M-W-F) and increased dose of spironolactone 25 mg daily. Following low sodium diet. No HE, GI bleeding. On exam, abdomen is full but soft.   As she has only had very slight increase in diuretics thus far, wouldn't expect her to have any significant change in peripheral edema at this point. I am hopeful that with escalating aldactone, this will help offset hypokalemia and we will be able to increase diuretics further to help with significant peripheral edema and prevent recurrent ascites. She is also taking potassium 10 meq daily. At this time, we will plan to recheck electrolytes. If within normal limits, will consider increasing diuretics further.   Additionally, we will obtain abdominal US and liver doppler.   She reports workup 12-13 years ago that was negative and confirmed NASH cirrhosis. Not sure if she has immunity to Hep A or B. We will check viral serologies.   For history of varices, she is on coreg, which will serve as bleeding prophylaxis. HR not at goal today, but with with hopes to adjust diuretics in this frail lady, I will hold off on adjusting coreg for now.    Plan:  No need for repeat paracentesis right now. Patient will monitor for worsening abdominal distention or peripheral edema and let me know if this occurs.  Abdominal US and liver doppler ASAP.  BMP, magnesium, Hep A Ab total, Hep B surface Ab, Hep B surface Ag, Hep  B core Ab, Hep C Ab Continue Lasix 20 mg Monday Wednesday Friday. Continue spironolactone 25 mg daily. Strict 2 g sodium diet restriction. Nutrition recommendations: High-protein diet from a primarily plant-based diet. Avoid red meat.  No raw or undercooked meat, seafood, or shellfish. Low-fat/cholesterol/carbohydrate diet. Continue to avoid alcohol. Okay to use Tylenol if needed.  No more than 2000 mg per 24 hours. Attempt to obtain EGD and colonoscopy records.  Close follow-up in 2 weeks. She will likely require frequent visits until we are able to get her stabilized.  Aliene Altes, PA-C [facility] 09/25/2021

## 2021-09-26 ENCOUNTER — Telehealth: Payer: Self-pay | Admitting: *Deleted

## 2021-09-26 LAB — CULTURE, BODY FLUID W GRAM STAIN -BOTTLE: Culture: NO GROWTH

## 2021-09-26 LAB — ACID FAST SMEAR (AFB, MYCOBACTERIA): Acid Fast Smear: NEGATIVE

## 2021-09-26 NOTE — Telephone Encounter (Signed)
Called gave appt details for [place]. Nothing further needed

## 2021-09-27 ENCOUNTER — Telehealth: Payer: Self-pay | Admitting: Gastroenterology

## 2021-09-27 NOTE — Telephone Encounter (Signed)
Brooke Gutierrez: Can you request EGD and TCS records from [facility]?

## 2021-09-27 NOTE — Telephone Encounter (Signed)
Brooke Gutierrez, please call patient to remind her about having labs completed ASAP. When you speak with her, please also ask her what hospital she had her EGD and colonoscopy completed in 2016. I would like to request these records if we can.

## 2021-09-27 NOTE — Telephone Encounter (Signed)
Spoke to pt, informed her to have labs completed as soon as possible. Pt voiced understanding. Pt stated she had EGD and colonoscopy done at [facility].

## 2021-09-30 DIAGNOSIS — E876 Hypokalemia: Secondary | ICD-10-CM | POA: Diagnosis not present

## 2021-09-30 DIAGNOSIS — R188 Other ascites: Secondary | ICD-10-CM | POA: Diagnosis not present

## 2021-09-30 DIAGNOSIS — K7460 Unspecified cirrhosis of liver: Secondary | ICD-10-CM | POA: Diagnosis not present

## 2021-10-01 ENCOUNTER — Other Ambulatory Visit: Payer: Self-pay | Admitting: Gastroenterology

## 2021-10-01 DIAGNOSIS — R188 Other ascites: Secondary | ICD-10-CM

## 2021-10-01 LAB — BASIC METABOLIC PANEL
BUN/Creatinine Ratio: 14 (ref 12–28)
BUN: 11 mg/dL (ref 8–27)
CO2: 21 mmol/L (ref 20–29)
Calcium: 8.6 mg/dL — ABNORMAL LOW (ref 8.7–10.3)
Chloride: 104 mmol/L (ref 96–106)
Creatinine, Ser: 0.81 mg/dL (ref 0.57–1.00)
Glucose: 146 mg/dL — ABNORMAL HIGH (ref 70–99)
Potassium: 4.2 mmol/L (ref 3.5–5.2)
Sodium: 141 mmol/L (ref 134–144)
eGFR: 72 mL/min/{1.73_m2} (ref >59)

## 2021-10-01 LAB — HEPATITIS B CORE ANTIBODY, TOTAL: Hep B Core Total Ab: NEGATIVE

## 2021-10-01 LAB — MAGNESIUM: Magnesium: 1.1 mg/dL — ABNORMAL LOW (ref 1.6–2.3)

## 2021-10-01 LAB — HEPATITIS A ANTIBODY, TOTAL: hep A Total Ab: POSITIVE — AB

## 2021-10-01 LAB — HEPATITIS B SURFACE ANTIGEN: Hepatitis B Surface Ag: NEGATIVE

## 2021-10-01 LAB — HEPATITIS C ANTIBODY: Hep C Virus Ab: NONREACTIVE

## 2021-10-01 LAB — HEPATITIS B SURFACE ANTIBODY,QUALITATIVE: Hep B Surface Ab, Qual: NONREACTIVE

## 2021-10-01 MED ORDER — MAGNESIUM CHLORIDE 64 MG PO TBEC: 2.0000 | DELAYED_RELEASE_TABLET | Freq: Two times a day (BID) | ORAL | 0 refills | Status: AC

## 2021-10-02 ENCOUNTER — Telehealth: Payer: Self-pay | Admitting: Gastroenterology

## 2021-10-02 ENCOUNTER — Encounter: Payer: Self-pay | Admitting: Internal Medicine

## 2021-10-02 NOTE — Telephone Encounter (Signed)
Received colonoscopy records from [facility] dated 07/01/2013.  She is found to have mild diverticulosis in the left colon, internal hemorrhoids, otherwise normal exam.  Per chart review, it looks like she had an EGD and colonoscopy with Dr. Britta Mccreedy in 2016 during an admission.  I did not receive any records from 2016.  I spoke with patient's daughter, Katha Cabal, and verified that patient has only ever been admitted to [facility] and recently [facility].  Manuela Schwartz: Can we request EGD and colonoscopy records 1 more time?  Please request records from 2016.

## 2021-10-03 DIAGNOSIS — R339 Retention of urine, unspecified: Secondary | ICD-10-CM | POA: Diagnosis not present

## 2021-10-03 NOTE — Telephone Encounter (Signed)
Received EGD records from [facility] completed in 2016.  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  No report of banding in the procedure prescription, but recommendations state repeat banding in 2-3 weeks and continue nadolol.  EGD 07/07/2014: 2 columns of medium sized varices in the distal esophagus, mild gastritis in the antrum.  Recommended return for repeat EGD in 1 year and continue nadolol.

## 2021-10-07 ENCOUNTER — Ambulatory Visit: Payer: Medicare Other | Admitting: Gastroenterology

## 2021-10-07 DIAGNOSIS — M79672 Pain in left foot: Secondary | ICD-10-CM | POA: Diagnosis not present

## 2021-10-07 DIAGNOSIS — E1140 Type 2 diabetes mellitus with diabetic neuropathy, unspecified: Secondary | ICD-10-CM | POA: Diagnosis not present

## 2021-10-07 DIAGNOSIS — L110 Acquired keratosis follicularis: Secondary | ICD-10-CM | POA: Diagnosis not present

## 2021-10-07 DIAGNOSIS — M79671 Pain in right foot: Secondary | ICD-10-CM | POA: Diagnosis not present

## 2021-10-08 ENCOUNTER — Ambulatory Visit (HOSPITAL_COMMUNITY)
Admission: RE | Admit: 2021-10-08 | Discharge: 2021-10-08 | Disposition: A | Discharge status: Home / Self Care | Payer: Medicare Other | Source: Ambulatory Visit | Attending: Gastroenterology | Admitting: Gastroenterology

## 2021-10-08 DIAGNOSIS — K7460 Unspecified cirrhosis of liver: Secondary | ICD-10-CM | POA: Diagnosis not present

## 2021-10-08 DIAGNOSIS — R188 Other ascites: Secondary | ICD-10-CM | POA: Diagnosis not present

## 2021-10-08 DIAGNOSIS — R161 Splenomegaly, not elsewhere classified: Secondary | ICD-10-CM | POA: Diagnosis not present

## 2021-10-09 ENCOUNTER — Other Ambulatory Visit: Payer: Self-pay | Admitting: Gastroenterology

## 2021-10-09 ENCOUNTER — Other Ambulatory Visit: Payer: Self-pay | Admitting: *Deleted

## 2021-10-09 LAB — BASIC METABOLIC PANEL
BUN/Creatinine Ratio: 11 — ABNORMAL LOW (ref 12–28)
BUN: 8 mg/dL (ref 8–27)
CO2: 23 mmol/L (ref 20–29)
Calcium: 9 mg/dL (ref 8.7–10.3)
Chloride: 104 mmol/L (ref 96–106)
Creatinine, Ser: 0.76 mg/dL (ref 0.57–1.00)
Glucose: 110 mg/dL — ABNORMAL HIGH (ref 70–99)
Potassium: 4.3 mmol/L (ref 3.5–5.2)
Sodium: 140 mmol/L (ref 134–144)
eGFR: 77 mL/min/{1.73_m2} (ref >59)

## 2021-10-09 LAB — MAGNESIUM: Magnesium: 1.2 mg/dL — ABNORMAL LOW (ref 1.6–2.3)

## 2021-10-09 MED ORDER — MAGNESIUM CHLORIDE 64 MG PO TBEC: 2.0000 | DELAYED_RELEASE_TABLET | Freq: Three times a day (TID) | ORAL | 0 refills | Status: AC

## 2021-10-09 NOTE — Addendum Note (Signed)
Addended by: Inda Castle on: 10/09/2021 11:43 AM   Modules accepted: Orders

## 2021-10-09 NOTE — Progress Notes (Signed)
Noted. Put new labs into Epic

## 2021-10-15 ENCOUNTER — Telehealth: Payer: Self-pay | Admitting: *Deleted

## 2021-10-15 NOTE — Telephone Encounter (Signed)
Pt's daughter Letta [facility]) called and stated pt has been sick to her stomach. She feels nauseated and can't eat. No diarrhea, but does have chills. Thinks it may be the magnesium that is causing some of the stomach aches.

## 2021-10-16 ENCOUNTER — Other Ambulatory Visit: Payer: Self-pay | Admitting: *Deleted

## 2021-10-16 ENCOUNTER — Other Ambulatory Visit: Payer: Self-pay | Admitting: Gastroenterology

## 2021-10-16 NOTE — Telephone Encounter (Signed)
Spoke with patient's daughter.  She reports her mom developed acute onset nausea and vomiting on Sunday.  Symptoms seem to be slowly improving, but still with some intermittent nausea and a couple episodes of vomiting.  She was able to keep crackers down yesterday as well as Sprite.  Patient told her daughter she was feeling some improved this morning.  She has not had any diarrhea or fever.  Daughter she has had family members with the "stomach bug" and is not sure if anyone had gone over to see her mom.   I am less suspicious that her symptoms are secondary to magnesium as she has been taking magnesium since Tuesday of last week and did not develop nausea or vomiting until Sunday.  Plan to try Zofran or Phenergan to treat supportively for the next couple of days, but received a warning due to low magnesium and risk of QT prolongation.  She is due for updated labs.  With vomiting, it is possible that electrolytes are worsening.   Patient's daughter states she is going to call her mom back today to see how she was doing and if she really needed any medications to help with nausea and would call us back.   She is due for repeat blood work at this time to follow-up on her magnesium level.  Will also add a BMP to this.  Patient's daughter states she will get her mom to the lab the soonest possible in the next day or so.  She was advised that she is unable to keep liquids down to stay well hydrated, feels weak, or like she will pass out, she needs to proceed to the emergency room.   Loma Sousa, can you add on a BMP to magnesium that has already been ordered and call [facility] to ensure they have both labs together to be drawn? Sometimes when labs are entered on 2 separate dates, they only draw the most recent order.

## 2021-10-16 NOTE — Progress Notes (Signed)
Error

## 2021-10-16 NOTE — Telephone Encounter (Addendum)
Spoke to pt's daughter Brooke Gutierrez) she informed me that her mother is feeling better. She stated she kept lunch down today and some fluids. Stated she took magnesium today and kept it down, so she will take it for the next couple days and have labs done next Monday or Tuesday. Daughter stated she did not want the Zofran or Phenergan sent to pharmacy since mother is feeling a little better. But, stated she would call if mother started to feel bad again. Called [facility] and informed of the added BMP. Labs entered into Epic.

## 2021-10-16 NOTE — Telephone Encounter (Signed)
Great. Thank you.

## 2021-10-16 NOTE — Telephone Encounter (Signed)
Your welcome.

## 2021-10-23 LAB — BASIC METABOLIC PANEL
BUN/Creatinine Ratio: 11 — ABNORMAL LOW (ref 12–28)
BUN: 8 mg/dL (ref 8–27)
CO2: 21 mmol/L (ref 20–29)
Calcium: 8.3 mg/dL — ABNORMAL LOW (ref 8.7–10.3)
Chloride: 101 mmol/L (ref 96–106)
Creatinine, Ser: 0.76 mg/dL (ref 0.57–1.00)
Glucose: 188 mg/dL — ABNORMAL HIGH (ref 70–99)
Potassium: 4.1 mmol/L (ref 3.5–5.2)
Sodium: 134 mmol/L (ref 134–144)
eGFR: 77 mL/min/{1.73_m2} (ref >59)

## 2021-10-24 ENCOUNTER — Other Ambulatory Visit: Payer: Self-pay | Admitting: Gastroenterology

## 2021-10-24 DIAGNOSIS — R600 Localized edema: Secondary | ICD-10-CM

## 2021-10-24 DIAGNOSIS — K7460 Unspecified cirrhosis of liver: Secondary | ICD-10-CM

## 2021-10-24 LAB — MAGNESIUM: Magnesium: 1.7 mg/dL (ref 1.6–2.3)

## 2021-10-24 LAB — SPECIMEN STATUS REPORT

## 2021-10-24 MED ORDER — MAGNESIUM CHLORIDE 64 MG PO TBEC: 2.0000 | DELAYED_RELEASE_TABLET | Freq: Three times a day (TID) | ORAL | 0 refills | Status: DC

## 2021-10-24 MED ORDER — FUROSEMIDE 20 MG PO TABS: 20.0000 mg | ORAL_TABLET | Freq: Every day | ORAL | 1 refills | Status: DC

## 2021-10-24 MED ORDER — SPIRONOLACTONE 25 MG PO TABS: 50.0000 mg | ORAL_TABLET | Freq: Every day | ORAL | 1 refills | Status: DC

## 2021-10-31 ENCOUNTER — Encounter (HOSPITAL_COMMUNITY): Payer: Self-pay | Admitting: Emergency Medicine

## 2021-10-31 ENCOUNTER — Emergency Department (HOSPITAL_COMMUNITY): Payer: Medicare Other

## 2021-10-31 ENCOUNTER — Other Ambulatory Visit: Payer: Self-pay

## 2021-10-31 ENCOUNTER — Emergency Department (HOSPITAL_COMMUNITY)
Admission: EM | Admit: 2021-10-31 | Discharge: 2021-10-31 | Disposition: A | Discharge status: Home / Self Care | Payer: Medicare Other | Attending: Emergency Medicine | Admitting: Emergency Medicine

## 2021-10-31 DIAGNOSIS — Z794 Long term (current) use of insulin: Secondary | ICD-10-CM | POA: Diagnosis not present

## 2021-10-31 DIAGNOSIS — I509 Heart failure, unspecified: Secondary | ICD-10-CM | POA: Diagnosis not present

## 2021-10-31 DIAGNOSIS — R0602 Shortness of breath: Secondary | ICD-10-CM | POA: Diagnosis not present

## 2021-10-31 DIAGNOSIS — Z79899 Other long term (current) drug therapy: Secondary | ICD-10-CM | POA: Insufficient documentation

## 2021-10-31 DIAGNOSIS — K7460 Unspecified cirrhosis of liver: Secondary | ICD-10-CM | POA: Diagnosis not present

## 2021-10-31 DIAGNOSIS — J449 Chronic obstructive pulmonary disease, unspecified: Secondary | ICD-10-CM | POA: Insufficient documentation

## 2021-10-31 DIAGNOSIS — E119 Type 2 diabetes mellitus without complications: Secondary | ICD-10-CM | POA: Insufficient documentation

## 2021-10-31 DIAGNOSIS — R188 Other ascites: Secondary | ICD-10-CM | POA: Diagnosis not present

## 2021-10-31 LAB — CBC WITH DIFFERENTIAL/PLATELET
Abs Immature Granulocytes: 0.03 10*3/uL (ref 0.00–0.07)
Basophils Absolute: 0 10*3/uL (ref 0.0–0.1)
Basophils Relative: 1 %
Eosinophils Absolute: 0.1 10*3/uL (ref 0.0–0.5)
Eosinophils Relative: 1 %
HCT: 38.9 % (ref 36.0–46.0)
Hemoglobin: 13 g/dL (ref 12.0–15.0)
Immature Granulocytes: 0 %
Lymphocytes Relative: 14 %
Lymphs Abs: 1.1 10*3/uL (ref 0.7–4.0)
MCH: 33.2 pg (ref 26.0–34.0)
MCHC: 33.4 g/dL (ref 30.0–36.0)
MCV: 99.5 fL (ref 80.0–100.0)
Monocytes Absolute: 0.4 10*3/uL (ref 0.1–1.0)
Monocytes Relative: 5 %
Neutro Abs: 6.2 10*3/uL (ref 1.7–7.7)
Neutrophils Relative %: 79 %
Platelets: 126 10*3/uL — ABNORMAL LOW (ref 150–400)
RBC: 3.91 MIL/uL (ref 3.87–5.11)
RDW: 16.9 % — ABNORMAL HIGH (ref 11.5–15.5)
WBC: 7.9 10*3/uL (ref 4.0–10.5)
nRBC: 0 % (ref 0.0–0.2)

## 2021-10-31 LAB — COMPREHENSIVE METABOLIC PANEL
ALT: 16 U/L (ref 0–44)
AST: 33 U/L (ref 15–41)
Albumin: 2.8 g/dL — ABNORMAL LOW (ref 3.5–5.0)
Alkaline Phosphatase: 113 U/L (ref 38–126)
Anion gap: 9 (ref 5–15)
BUN: 13 mg/dL (ref 8–23)
CO2: 24 mmol/L (ref 22–32)
Calcium: 8.9 mg/dL (ref 8.9–10.3)
Chloride: 102 mmol/L (ref 98–111)
Creatinine, Ser: 0.73 mg/dL (ref 0.44–1.00)
GFR, Estimated: >60 mL/min (ref >60)
Glucose, Bld: 146 mg/dL — ABNORMAL HIGH (ref 70–99)
Potassium: 3.7 mmol/L (ref 3.5–5.1)
Sodium: 135 mmol/L (ref 135–145)
Total Bilirubin: 5.6 mg/dL — ABNORMAL HIGH (ref 0.3–1.2)
Total Protein: 6.2 g/dL — ABNORMAL LOW (ref 6.5–8.1)

## 2021-10-31 LAB — PROTIME-INR
INR: 1.8 — ABNORMAL HIGH (ref 0.8–1.2)
Prothrombin Time: 20.5 seconds — ABNORMAL HIGH (ref 11.4–15.2)

## 2021-10-31 LAB — TYPE AND SCREEN
ABO/RH(D): O POS
Antibody Screen: NEGATIVE

## 2021-10-31 LAB — ABO/RH: ABO/RH(D): O POS

## 2021-10-31 MED ORDER — HEPARIN SOD (PORK) LOCK FLUSH 100 UNIT/ML IV SOLN
INTRAVENOUS | Status: AC
  Administered 2021-10-31: 500 [IU]
  Filled 2021-10-31: qty 5

## 2021-10-31 NOTE — ED Provider Notes (Signed)
[facility] EMERGENCY DEPARTMENT Provider Note   CSN: [id number] Arrival date & time: 10/31/21  1349     History  Chief Complaint  Patient presents with   Pleurisy    Brooke Gutierrez is a 86 y.o. female.  Brooke Gutierrez is a 86 y.o. female with history of nonalcoholic cirrhosis, diabetes, COPD, CHF, who presents to the emergency department for abdominal swelling and shortness of breath.  Patient reports that fluid has been progressively building up and now her abdomen feels so tight that she is constantly uncomfortable and it is making difficult for her to get a deep breath.  She reports that she could not sleep last night due to abdominal discomfort and shortness of breath and had to sit up on the edge of the bed.  She reports that the last time she felt like this was 3 weeks ago before her last paracentesis.  She had a little over 2 L taken off last time.  She reports that she has been having to get these done with increasing frequency, and is followed by Dr. Gala Romney with GI for her cirrhosis, they have been trying to be seen to get regularly scheduled paracentesis set up but she keeps ending up in the hospital and has not been able to get to an appointment.  They have an appointment next week but her symptoms got so bad last night that she came in today for evaluation.  No associated fevers, no nausea, vomiting, hematemesis, melena or hematochezia.  No chest pain.  Daughter at bedside helps to provide history.  The history is provided by the patient and a relative.       Home Medications Prior to Admission medications   Medication Sig Start Date End Date Taking? Authorizing Provider  carvedilol (COREG) 3.125 MG tablet Take 1 tablet (3.125 mg total) by mouth 2 (two) times daily. Patient taking differently: Take 6.25 mg by mouth 2 (two) times daily. 03/22/21   Roxan Hockey, MD  chlorhexidine (PERIDEX) 0.12 % solution SMARTSIG:By Mouth 08/21/21   [provider]   clonazePAM (KLONOPIN) 0.5 MG tablet Take 0.5 mg by mouth daily as needed. 08/23/19   [provider]  ferrous sulfate 325 (65 FE) MG tablet Take 325 mg by mouth daily with breakfast.    [provider]  furosemide (LASIX) 20 MG tablet Take 1 tablet (20 mg total) by mouth daily. 10/24/21   Erenest Rasher, PA-C  insulin glargine (LANTUS) 100 UNIT/ML injection Inject 5-15 Units into the skin at bedtime. Sliding scale    [provider]  insulin lispro (HUMALOG) 100 UNIT/ML injection Inject 10 Units into the skin 3 (three) times daily with meals. Sliding scale    [provider]  magnesium chloride (SLOW-MAG) 64 MG TBEC SR tablet Take 2 tablets (128 mg total) by mouth in the morning, at noon, and at bedtime. 10/24/21   Erenest Rasher, PA-C  potassium chloride (KLOR-CON) 10 MEQ tablet Take 1 tablet (10 mEq total) by mouth daily. Take While taking Lasix/furosemide 03/22/21   Roxan Hockey, MD  pravastatin (PRAVACHOL) 20 MG tablet Take 20 mg by mouth every evening.    [provider]  spironolactone (ALDACTONE) 25 MG tablet Take 2 tablets (50 mg total) by mouth daily. 10/24/21 10/24/22  Erenest Rasher, PA-C      Allergies    Ciprofloxacin, Levaquin [levofloxacin], Sulfa antibiotics, and Other    Review of Systems   Review of Systems  Constitutional:  Negative for  chills and fever.  Respiratory:  Positive for shortness of breath.   Cardiovascular:  Negative for chest pain.  Gastrointestinal:  Positive for abdominal distention and abdominal pain.  All other systems reviewed and are negative.   Physical Exam Updated Vital Signs BP 122/68 (BP Location: Left Arm)   Pulse 94   Temp 97.9 F (36.6 C) (Oral)   Resp 18   Ht '5\' 2"'$  (1.575 m)   Wt 79.4 kg   SpO2 95%   BMI 32.02 kg/m  Physical Exam Vitals and nursing note reviewed.  Constitutional:      General: She is not in acute distress.    Appearance: Normal appearance. She is  well-developed. She is not diaphoretic.     Comments: Elderly female, alert, chronically ill-appearing but in no acute distress  HENT:     Head: Normocephalic and atraumatic.  Eyes:     General:        Right eye: No discharge.        Left eye: No discharge.     Pupils: Pupils are equal, round, and reactive to light.  Cardiovascular:     Rate and Rhythm: Normal rate and regular rhythm.     Pulses: Normal pulses.     Heart sounds: Normal heart sounds.  Pulmonary:     Effort: Pulmonary effort is normal. No respiratory distress.     Breath sounds: Normal breath sounds. No wheezing or rales.     Comments: Respirations equal and unlabored, patient able to speak in full sentences, lungs clear to auscultation bilaterally, some decreased breath sounds in bilateral bases Abdominal:     General: Bowel sounds are normal. There is distension.     Palpations: There is fluid wave. There is no mass.     Tenderness: There is no guarding.     Comments: Abdomen is distended with positive fluid wave, mild tenderness but no focal pain, guarding or peritoneal signs  Musculoskeletal:        General: No deformity.     Cervical back: Neck supple.  Skin:    General: Skin is warm and dry.     Capillary Refill: Capillary refill takes less than 2 seconds.  Neurological:     Mental Status: She is alert and oriented to person, place, and time.     Coordination: Coordination normal.     Comments: Speech is clear, able to follow commands Moves extremities without ataxia, coordination intact  Psychiatric:        Mood and Affect: Mood normal.        Behavior: Behavior normal.     ED Results / Procedures / Treatments   Labs (all labs ordered are listed, but only abnormal results are displayed) Labs Reviewed  TYPE AND SCREEN  ABO/RH    EKG EKG Interpretation  Date/Time:  Thursday October 31 2021 15:06:37 EDT Ventricular Rate:  98 PR Interval:  168 QRS Duration: 70 QT Interval:  364 QTC  Calculation: 464 R Axis:   -36 Text Interpretation: Sinus rhythm with marked sinus arrhythmia Left axis deviation Low voltage QRS Possible Anterolateral infarct , age undetermined Abnormal ECG Reconfirmed by Sherwood Gambler [phone]) on 10/31/2021 3:47:44 PM  Radiology DG Chest 2 View  Result Date: 10/31/2021 CLINICAL DATA:  Shortness of breath EXAM: CHEST - 2 VIEW COMPARISON:  09/19/2021 FINDINGS: Right-sided central venous port tip over the SVC. No acute airspace disease or effusion. Normal cardiac size. No pneumothorax. IMPRESSION: No active cardiopulmonary disease. Electronically Signed   By: Maudie Mercury  Francoise Ceo M.D.   On: 10/31/2021 15:33    Procedures Procedures    Medications Ordered in ED Medications - No data to display  ED Course/ Medical Decision Making/ A&P                           Medical Decision Making Amount and/or Complexity of Data Reviewed Labs: ordered. Radiology: ordered.   86 y.o. female presents to the ED with complaints of abdominal distention and shortness of breath, this involves an extensive number of treatment options, and is a complaint that carries with it a high risk of complications and morbidity.  The differential diagnosis includes ascites, pneumonia, CHF, pneumothorax, SBP  On arrival pt is nontoxic, vitals WNL aside from mild hypertension.   Additional history obtained from daughter at bedside. Previous records obtained and reviewed     Lab Tests:  I Ordered, reviewed, and interpreted labs, which included: No leukocytosis and normal hemoglobin, platelets 126, no significant electrolyte derangements, bili of 5.6 but LFTs otherwise normal.  INR of 1.8, similar to prior.  Imaging Studies ordered:  I ordered imaging studies which included chest x-ray, I independently visualized and interpreted imaging which showed no pneumonia, pulmonary edema or other active cardiopulmonary disease  ED Course:   Suspect patient's shortness of breath is due to  worsening ascites and that patient will need therapeutic paracentesis.  Given reassuring abdominal exam, vitals and labs I have extremely low suspicion for SBP and do not feel patient needs diagnostics with paracentesis.  Discussed case with Dr. Darcella Cheshire with radiology and they will do therapeutic paracentesis at bedside.  They removed 4.35 L of yellow ascitic fluid and patient tolerated procedure extremely well.  Patient now feels much better, she is able to lay down and denies any shortness of breath.  Feel patient is appropriate for discharge home now and has follow-up scheduled with her GI doctor next week.  Patient and family expressed understanding and agreement.  Discharged home in good condition.  Portions of this note were generated with [employer]. Dictation errors may occur despite best attempts at proofreading.         Final Clinical Impression(s) / ED Diagnoses Final diagnoses:  Ascites of liver  Shortness of breath    Rx / DC Orders ED Discharge Orders     None         Janet Berlin 10/31/21 1903    Sherwood Gambler, MD 11/01/21 [phone]

## 2021-10-31 NOTE — Discharge Instructions (Addendum)
I am glad your symptoms have resolved with paracentesis.  Please follow-up with Dr. Gala Romney next week as planned.  You will likely need regularly scheduled paracentesis as an outpatient.  You can talk to Dr. Gala Romney about potential referral to interventional radiology for TIPS procedure if you are having to have paracentesis with increasing frequency.  Your labs overall look good.  Return for fevers or other new or worsening symptoms.

## 2021-10-31 NOTE — ED Triage Notes (Signed)
Pt presents with chest and abdominal pain, fluid overload, history on nonalcoholic cirrhosis of the liver.

## 2021-10-31 NOTE — Procedures (Signed)
PreOperative Dx: Cirrhosis, ascites Postoperative Dx: Cirrhosis, ascites Procedure:   US guided paracentesis Radiologist:  Thornton Papas Anesthesia:  10 ml of1% lidocaine Specimen:  4.35 L of yellow ascitic fluid EBL:   < 1 ml Complications: none

## 2021-10-31 NOTE — ED Notes (Signed)
US at bedside for paracentesis  

## 2021-11-01 ENCOUNTER — Telehealth: Payer: Self-pay | Admitting: *Deleted

## 2021-11-01 NOTE — Telephone Encounter (Signed)
Spoke to pt's daughter Brooke Gutierrez [facility]). She informed me that she had to take her mother to the ED yesterday due to not feeling well and swelling. She states they did some blood work, but was not sure if it was the blood work that was needed. She states she will wait until her mothers OV next week to see what other blood work she may need.

## 2021-11-01 NOTE — Telephone Encounter (Signed)
Reviewed ER evaluation.  She presented due to abdominal swelling and shortness of breath.  Overall, labs are stable.  Kidney function and electrolytes within normal limits.  They did not check magnesium which we do need.  Chest x-ray was negative.  She had 4.5 L of fluid removed via paracentesis.  No labs were ordered on the fluid that was removed.  Recommend that she go ahead and have magnesium checked prior to office visit. Continue current diuretic regimen. Strict 2 g sodium diet. As we have discussed previously, she can call to let us know if she feels that her abdomen is getting distended/tight and she is in need of a paracentesis rather than waiting until she is so uncomfortable that she ends up in the emergency room.  We have not scheduled routine paracentesis as we are trying to adjust her fluid medications in hopes that we will not have to continue to do these on a regular basis, but we can order them if needed.  We will see her next week.

## 2021-11-04 ENCOUNTER — Other Ambulatory Visit: Payer: Self-pay | Admitting: *Deleted

## 2021-11-04 DIAGNOSIS — R339 Retention of urine, unspecified: Secondary | ICD-10-CM | POA: Diagnosis not present

## 2021-11-04 NOTE — Telephone Encounter (Signed)
Spoke to pt daughter Letta [facility]) informed her of recommendations. She voiced under. Informed to have labs done this week before OV on Thursday.

## 2021-11-05 NOTE — Progress Notes (Unsigned)
GI Office Note    Referring Provider: Neale Burly, MD Primary Care Physician:  Neale Burly, MD Primary Gastroenterologist: Dr. Gala Romney  Date:  11/07/2021  ID:  Brooke Gutierrez, DOB Nov 10, 1935, MRN [id number]   Chief Complaint   Chief Complaint  Patient presents with   Follow-up    History of Present Illness  Brooke Gutierrez is a 86 y.o. 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   EGD in 2016 via [facility].  Per review of care everywhere it appears she had an EGD and colonoscopy with esophageal/gastric varices, gastritis but operative notes were not available.  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 Laural Golden in 2012.  She was initially seen by our service during hospitalization from 7/13 - 7/15 with ascites, peripheral edema, shortness of breath.  Patient and her daughter had reported progressive symptoms for 1 month prior to admission.  Of note she has also had 2 prior admissions this year for dyspnea and possible acute on chronic congestive heart failure with lower extremity edema.  Her most recent mission prior to admission at [facility] was at [facility] on 7/3 - 7/4 for bilateral lower extremity edema that was treated with IV Lasix and electrolyte correction.  She was previously on p.o. Lasix but her dose was decreased due to severe hypokalemia and hypomagnesemia.  She was discharged home with Lasix every other day and oral potassium.  She has also been on spironolactone 12.5 mg daily with this hospitalization.  After her discharge from [facility] she developed rapid fluid retention with a 30 pound weight gain and she was found to have ascites and peripheral edema on exam and again presented with hypokalemia and  hypomagnesemia.  She was again treated with IV Lasix and electrolyte correction and she underwent ultrasound paracentesis yielding 2.3 L of fluid.  She was negative for SBP.  She was discharged home on Lasix 20 mg 3 times weekly in our service increase her Aldactone to 25 mg daily with recommendation to recheck electrolytes in 1 week and follow-up in the office in 2 weeks and to have abdominal ultrasound with Doppler outpatient.  She has had normal echocardiogram in December 2022, found in [place].  Patient last seen on 09/25/2021 for hospital follow-up with concern for increased abdominal girth and peripheral edema since her last paracentesis and discharge from the hospital.  Patient to be a miscommunication regarding possibly setting up as needed paracenteses versus needing an early interval follow-up outpatient.  At the time of her visit she had reported abdominal girth and swelling was about the same and had not been worsening.  It was noted that she had about a 7 pound weight gain since hospitalization however this is based off of weight that was collected via hospital bed.  She reportedly is following a low-sodium diet and was taking her Lasix every other day with spironolactone 25 mg daily.  Also supplementing with 10 mEq of potassium daily.  She also reported being unsure if she had received hepatitis A or B vaccinations as she reported she was diagnosed with Karlene Lineman cirrhosis 12 to 13 years prior.  Most recent MELD Na was 18, Child-Pugh C.  She did not have any evidence of hepatic encephalopathy or GI bleeding.  Follow-up labs were ordered to assess electrolytes and based on results, will consider increasing diuretics.  Abdominal ultrasound liver Doppler was ordered as well.  Hepatitis viral serologies ordered to assess for immunity to hep A B.  She is also advised to continue Coreg for bleeding prophylaxis.  She was advised to follow-up in 2 weeks, requiring frequent visits to stabilize her and  determine need for paracentesis versus increasing diuretics.  Hepatitis A antibody positive, no evidence of active hepatitis B or hepatitis C.  She is found to not be immune to hepatitis B.  Abdominal ultrasound liver Doppler performed 10/08/2021 with evidence of cirrhosis and patent portal system with moderate ascites and mild splenomegaly without any focal liver lesion.  Brooke Altes, Brooke Gutierrez received colonoscopy records from [facility] dated 07/01/13 in which she was noted to have mild diverticulosis in the left colon and internal hemorrhoids.  EGD records received and patient had EGD on 06/16/2014 noting portal hypertensive gastropathy, 2 columns of large varices in the distal esophagus, hiatal hernia without reported banding however recommendations stated repeat banding in 2 to 3 weeks and to continue nadolol.  Subsequent EGD 07/07/2014 with 2 columns of medium sized varices in the distal esophagus, mild gastritis in the antrum and advised to repeat EGD in 1 year.  Since her last office visit, patient had a brief episode of nausea and vomiting and magnesium has been rechecked as she was taking magnesium supplements.  Magnesium levels: 09/20/2021 2.1 09/21/2021 1.6 09/30/2021 1.1 10/08/2021 1.2 10/22/2021 1.7  Given her recent normalization of magnesium on 10/22/2021, patient was recommended to increase her Lasix to 20 mg every day and spironolactone to 50 mg daily and to continue her current dose of magnesium 3 times daily.  Plan is to repeat BMP and magnesium in 1 week.  Patient went to the ER on 10/31/2021 due to concerns of not feeling well with shortness of breath and swelling.  Her labs are stable, kidney function and electrolytes were also within normal limits.  They did not check magnesium which needs to be rechecked.  She had negative chest x-ray and paracentesis with 4.5 L of fluid removal without any cytology or cell counts.  She was recommended to have magnesium checked prior to office  visit and for now to continue current diuretic regimen and continue to follow strict to have sodium diet.  Brooke Altes, Brooke Gutierrez also recommended for patient and/or daughter to call the office and let us know when her abdomen is feeling distended/tight so we may adjust diuretics and/or order paracentesis rather than going to the ED.  The goal is to not have regular paracenteses and to manage with diuretics.  Currently is not optimized. Magnesium 11/07/21 with Magnesium 1.6.  Today: Weight has been anywhere 147-163. Today she was 163 at home. She reports she appreciates them staying over to perform the paracentesis. Legs are staying swollen constantly. Has been short of breath at times. Had chest pain last week when she could not breath. Dopes not eat much due to abdominal swelling. Nausea occurs with the increased abdominal swelling. No significant bowel movement in about a week. Chronic fatigue. Not eating much at all during the day. Had a half biscuit with gravy this morning and had a banana today. Yesterday had snap beans, cornbread, potatoes, turnip. No meat. Eats a lot of low sodium [place]. Glucerna's she drinks 1 per day and peanut butter. NO BM in about a week. Some toilet tissue hematochezia possibly related to hemorrhoid.  Daughter reports that the patient has a hard time getting around at home and making frequent trips to the bathroom.  Concerning that with increasing diuretics that she will be unable to do this.  She also states that her husband has to help her get into bed at times as well and he does not have great mobility either.  Patient still does cook and do other things around the house by herself but her husband and her live together without any daily assistance.  Both of the patient's daughters check in on her and her daughter Letta Median who is with her today during her visit checks on her about once weekly and has been in the 1 taking her to the ED for paracentesis when needed.  The patient's  other daughter just recently had surgery therefore she is not of much assistance right now.   Current Outpatient Medications  Medication Sig Dispense Refill   carvedilol (COREG) 3.125 MG tablet Take 1 tablet (3.125 mg total) by mouth 2 (two) times daily. (Patient taking differently: Take 6.25 mg by mouth 2 (two) times daily.) 60 tablet 1   ferrous sulfate 325 (65 FE) MG tablet Take 325 mg by mouth daily with breakfast.     furosemide (LASIX) 20 MG tablet Take 1 tablet (20 mg total) by mouth daily. 30 tablet 1   insulin glargine (LANTUS) 100 UNIT/ML injection Inject 5-15 Units into the skin at bedtime. Sliding scale     insulin lispro (HUMALOG) 100 UNIT/ML injection Inject 10 Units into the skin 3 (three) times daily with meals. Sliding scale     magnesium chloride (SLOW-MAG) 64 MG TBEC SR tablet Take 2 tablets (128 mg total) by mouth in the morning, at noon, and at bedtime. 84 tablet 0   potassium chloride (KLOR-CON) 10 MEQ tablet Take 1 tablet (10 mEq total) by mouth daily. Take While taking Lasix/furosemide 30 tablet 2   pravastatin (PRAVACHOL) 20 MG tablet Take 20 mg by mouth every evening.     spironolactone (ALDACTONE) 25 MG tablet Take 3 tablets (75 mg total) by mouth daily. 60 tablet 1   No current facility-administered medications for this visit.    Past Medical History:  Diagnosis Date   CHF (congestive heart failure) ([place])    ?normal ECHO 02/2021   Cirrhosis ([place])    COPD (chronic obstructive pulmonary disease) (H[facility])    Diabetes mellitus without complication (H[facility])    HLD (hyperlipidemia)     Past Surgical History:  Procedure Laterality Date   ABDOMINAL HYSTERECTOMY     APPENDECTOMY     BLADDER REPAIR     CESAREAN SECTION     COLONOSCOPY     ESOPHAGOGASTRODUODENOSCOPY     GALLBLADDER SURGERY      History reviewed. No pertinent family history.  Allergies as of 11/07/2021 - Review Complete 11/07/2021  Allergen Reaction Noted   Ciprofloxacin  09/30/2018   Levaquin  [levofloxacin]  09/30/2018   Sulfa antibiotics  09/30/2018   Other Rash 08/03/2019    Social History   Socioeconomic History   Marital status: Married    Spouse name: Not on file   Number of children: Not on file   Years of education: Not on file   Highest education level: Not on file  Occupational History   Not on file  Tobacco Use   Smoking status: Never   Smokeless tobacco: Never  Vaping Use   Vaping Use: Never used  Substance and Sexual Activity   Alcohol use: Never  Drug use: Never   Sexual activity: Not on file  Other Topics Concern   Not on file  Social History Narrative   Not on file   Social Determinants of Health   Financial Resource Strain: Not on file  Food Insecurity: Not on file  Transportation Needs: Not on file  Physical Activity: Not on file  Stress: Not on file  Social Connections: Not on file     Review of Systems   Gen: + Fatigue, weakness.  Denies fever, chills, anorexia. Denies weight loss.  CV: Denies chest pain, palpitations, syncope, peripheral edema, and claudication. Resp: + shortness of breath and dyspnea at rest. Denies cough, wheezing, coughing up blood, and pleurisy. GI: See HPI Derm: Denies rash, itching, dry skin Psych: Denies depression, anxiety, memory loss, confusion. No homicidal or suicidal ideation.  Heme: Denies bruising, bleeding, and enlarged lymph nodes.   Physical Exam   BP 125/62 (BP Location: Right Arm, Patient Position: Sitting, Cuff Size: Normal)   Pulse 90   Temp 97.9 F (36.6 C) (Temporal)   Ht '5\' 2"'$  (1.575 m)   Wt 163 lb (73.9 kg)   SpO2 96%   BMI 29.81 kg/m   General:   Alert and oriented. No distress noted. Pleasant and cooperative.  Head:  Normocephalic and atraumatic. Eyes:  Conjuctiva clear without scleral icterus. Mouth:  Oral mucosa pink and moist. Good dentition. No lesions. Lungs:  Clear to auscultation bilaterally, shallow, diminished in the bases. Mild tachypnea.  Heart:  S1, S2 present  without murmurs appreciated.  Abdomen:  +BS, soft, distended, firm,. No rebound or guarding. No HSM or masses noted. Rectal: deferred Msk:  Symmetrical without gross deformities. Normal posture. Extremities:  Without edema. Neurologic:  Alert and  oriented x4 Psych:  Alert and cooperative. Normal mood and affect.   Assessment  GRADY LUCCI is a 86 y.o. female with a history of cirrhosis, thrombocytopenia, esophageal varices, Hodgkin's disease with mixed cellulairty (stage 3b diagnosed in 2008), DM2, HLD, and COPD presenting today for follow up of ascites.   NASH cirrhosis with ascites/lower extremity edema: MELD Na 21 based off of recent labs from ED visit.  Child-Pugh C.  Her course has been complicated by thrombocytopenia, elevated INR, esophageal and gastric varices with banding in 2016, ascites requiring multiple paracentesis due to inadequate diuretic therapy and electrolyte abnormalities, and peripheral edema.  She has had multiple recent hospitalizations this year and recent ED visit on 10/31/2021 due to shortness of breath and abdominal swelling.  Last paracentesis was 10/31/2021 for she had 4.5 L removed and had immediate relief of shortness of breath and usually has a weight loss of about 10 to 15 pounds.  Patient reports prior work-up 12 to 13 years ago when she was diagnosed as having NASH.  She has been negative for active or chronic viral hepatitis, however she is not immune to hepatitis B.  We have recommended vaccination.  Recent abdominal ultrasound liver Doppler without evidence of focal liver lesion and patent portal system.  Patient reports weights at home have been ranging anywhere to doing 147 pounds and 163 pounds.  Patient reports extreme discomfort today and has been feeling short of breath for the last 24 hours.  She is visibly short of breath sitting in her wheelchair for visit today, her abdomen is distended and slightly firm and patient does not feel like she could wait  another few days to a week without having fluid removed.  We have been working toward optimizing  diuretic therapy and currently her electrolytes have been stable.  Need to slowly do a stepup approach with her diuretics therefore I have increased her spironolactone to 75 mg daily today and if labs are stable in 1 week, may consider increasing furosemide dose.  Patient and her daughter feel like they would benefit from as needed paracentesis so she does not need to go to the ED every time due to shortness of breath.  Would like to optimize diuretic therapy prior to this.  I will be out of the office next week therefore I have placed 1 as needed paracentesis order and will see the patient again in the office in 2 to 3 weeks.  We discussed keeping a weight log at home to determine her weight post paracentesis and see how gradual her weight gain is at home.  Based on her description of meals today she likely has not been super compliant with a 2 g sodium diet given her limited mobility at home.  She has been drinking 1 Glucerna a day for protein, have advised to at least increase that to 2/day as she reports a somewhat lack of appetite.  Patient has significant fatigue.  No episodes of confusion and has no signs of hepatic encephalopathy.  No asterixis present.  Constipation: Patient been suffering from constipation for a while now.  Reports only a small bowel movement the day that she went to the ED.  Has not had a bowel movement since then.  She does endorse some toilet tissue hematochezia.  She has not been able to be very active as her fluid buildup and shortness of breath prevents her from doing many daily activities.  For now we will start lactulose 15 mL twice daily and will increase as needed.  Given patient's clinical status and decreased mobility, we discussed her safety at home with her daughter present.  They note that her husband is also not in great physical health and also has some decreased cognitive  capacity as well.  Given her ongoing shortness of breath and frequency of symptoms, I have advised them to reach out to their PCP and to contact insurance company to get home health services/assistance to provide ongoing safe care at home.  PLAN    Paracentesis with labs, this week. PRN paracentesis next week.  Start lactulose 31m BID.  Keep weight log and bring to next appointment Hepatitis B vaccination Continue Lasix 20 mg daily Increase spironolactone to 75 mg  Continue Coreg for variceal bleeding prophylaxis Continue strict 2 g sodium diet Continue diet high in protein and low in fat, cholesterol, and carbohydrates.  Avoid raw or undercooked meats, including red meat, and shellfish. Continue to avoid alcohol and NSAIDs May use Tylenol as needed for pain, limit to no more than 2000 mg/day BMP and Magnesium level in 1 week Increase Glucerna to 2/day.  Needs home health services set-up with likely daily or intermittent weekly home health aide and nursing services. Follow-up in 2-3 weeks    CVenetia Night MSN, FNP-BC, AGACNP-BC [facility]

## 2021-11-06 ENCOUNTER — Telehealth: Payer: Self-pay | Admitting: Gastroenterology

## 2021-11-06 LAB — MAGNESIUM: Magnesium: 1.6 mg/dL (ref 1.6–2.3)

## 2021-11-06 NOTE — Telephone Encounter (Signed)
Pt had magnesium drawn yesterday and I spoke to pt's daughter and informed her of results and reminded her of up coming OV.

## 2021-11-06 NOTE — Telephone Encounter (Signed)
Noted  

## 2021-11-06 NOTE — Telephone Encounter (Signed)
Patient has office visit with me tomorrow afternoon, she is in need of having her magnesium drawn as it is already ordered.  This will help with knowing which medication adjustments to be made and can be discussed at her appointment tomorrow.  Please call patient or her daughter to see if she can have labs drawn this afternoon or tomorrow morning.  Venetia Night, MSN, APRN, FNP-BC, AGACNP-BC [facility]

## 2021-11-07 ENCOUNTER — Encounter: Payer: Self-pay | Admitting: *Deleted

## 2021-11-07 ENCOUNTER — Encounter: Payer: Self-pay | Admitting: Gastroenterology

## 2021-11-07 ENCOUNTER — Other Ambulatory Visit: Payer: Self-pay | Admitting: *Deleted

## 2021-11-07 ENCOUNTER — Ambulatory Visit (INDEPENDENT_AMBULATORY_CARE_PROVIDER_SITE_OTHER): Payer: Medicare Other | Admitting: Gastroenterology

## 2021-11-07 VITALS — BP 125/62 | HR 90 | Temp 97.9°F | Ht 62.0 in | Wt 163.0 lb

## 2021-11-07 DIAGNOSIS — K5900 Constipation, unspecified: Secondary | ICD-10-CM | POA: Diagnosis not present

## 2021-11-07 DIAGNOSIS — R0602 Shortness of breath: Secondary | ICD-10-CM

## 2021-11-07 DIAGNOSIS — R600 Localized edema: Secondary | ICD-10-CM

## 2021-11-07 DIAGNOSIS — K7460 Unspecified cirrhosis of liver: Secondary | ICD-10-CM

## 2021-11-07 DIAGNOSIS — R188 Other ascites: Secondary | ICD-10-CM

## 2021-11-07 MED ORDER — SPIRONOLACTONE 25 MG PO TABS
75.0000 mg | ORAL_TABLET | Freq: Every day | ORAL | 1 refills | Status: DC
Start: 2021-11-07 — End: 2021-11-26

## 2021-11-07 MED ORDER — LACTULOSE 10 GM/15ML PO SOLN: 10.0000 g | Freq: Two times a day (BID) | ORAL | Status: DC

## 2021-11-07 NOTE — Patient Instructions (Addendum)
I am ordering a paracentesis, we will work on getting you in tomorrow to complete it given your shortness of breath.   I recommend for him to have hepatitis B vaccination, you may get this done at a local pharmacy or at your PCP office.  I have refilled your spironolactone for today, increase to 3 tablets daily (75 mg).  We will get repeat BMP and magnesium in 1 week.  Continue Lasix and your Coreg.  It is very important that you follow strict 2 g sodium diet.  Also need to to start increasing her protein.  I would you to increase your shakes to 2 to 3/day.  Please start keeping a daily log of your weights at home and bring to your next appointment.   To help with your constipation, I am ordering lactulose for you to take 15 mL twice daily.  The goal is to have 2 semisoft bowel movements daily.  I strongly encourage you to talk to your PCP office as well as your insurance regarding home health services.   I want to see you back in 2 to 3 weeks.   It was a pleasure to see you today. I want to create trusting relationships with patients. If you receive a survey regarding your visit,  I greatly appreciate you taking time to fill this out on paper or through your MyChart. I value your feedback.  Venetia Night, MSN, FNP-BC, AGACNP-BC [facility]

## 2021-11-08 ENCOUNTER — Encounter (HOSPITAL_COMMUNITY): Payer: Self-pay

## 2021-11-08 ENCOUNTER — Ambulatory Visit (HOSPITAL_COMMUNITY)
Admission: RE | Admit: 2021-11-08 | Discharge: 2021-11-08 | Disposition: A | Discharge status: Home / Self Care | Payer: Medicare Other | Source: Ambulatory Visit | Attending: Gastroenterology | Admitting: Gastroenterology

## 2021-11-08 DIAGNOSIS — K7460 Unspecified cirrhosis of liver: Secondary | ICD-10-CM | POA: Diagnosis not present

## 2021-11-08 DIAGNOSIS — R188 Other ascites: Secondary | ICD-10-CM | POA: Diagnosis not present

## 2021-11-08 LAB — GRAM STAIN

## 2021-11-08 LAB — BODY FLUID CELL COUNT WITH DIFFERENTIAL
Eos, Fluid: 0 %
Lymphs, Fluid: 62 %
Monocyte-Macrophage-Serous Fluid: 16 % — ABNORMAL LOW (ref 50–90)
Neutrophil Count, Fluid: 22 % (ref 0–25)
Total Nucleated Cell Count, Fluid: 223 cu mm (ref 0–1000)

## 2021-11-08 LAB — ACID FAST CULTURE WITH REFLEXED SENSITIVITIES (MYCOBACTERIA): Acid Fast Culture: NEGATIVE

## 2021-11-08 NOTE — Procedures (Signed)
PreOperative Dx: Cirrhosis, ascites Postoperative Dx: Cirrhosis, ascites Procedure:   US guided paracentesis Radiologist:  Zuri Lascala Anesthesia:  10 ml of1% lidocaine Specimen:  4.5 L of yellow ascitic fluid EBL:   < 1 ml Complications: None  

## 2021-11-08 NOTE — Progress Notes (Signed)
Paracentesis complete no signs of distress.  

## 2021-11-12 ENCOUNTER — Telehealth: Payer: Self-pay | Admitting: *Deleted

## 2021-11-12 LAB — CYTOLOGY - NON PAP

## 2021-11-12 NOTE — Telephone Encounter (Signed)
I'm routing to you an absences of Venetia Night, NP. Pt 's daughter  Letta Median [facility]) called and states her mother needs a standing order for paracentesis. She is swelling up with fluid at a rapid rate. She states pt is taking medication as prescribed, but it is not helping.

## 2021-11-12 NOTE — Telephone Encounter (Signed)
Patient is already scheduled for a paracentesis tomorrow per Venetia Night, NP orders. Last para was on 9/1. She very well may need standing orders at some point, but Loma Sousa is trying to slowly increase her diuretics. She can continue to call back to let us know if she needs a paracentesis for now and we can get them arranged as needed, usually pretty quickly without any problem. She is due for repeat blood work later this week to see if kidney function and electrolytes are staying stable since increasing diuretics at her last visit with Loma Sousa.   It is very important that she follow a strict low sodium diet consuming less than 2000 mg of sodium which includes everything she eats and drinks.

## 2021-11-13 ENCOUNTER — Other Ambulatory Visit: Payer: Self-pay | Admitting: *Deleted

## 2021-11-13 ENCOUNTER — Ambulatory Visit (HOSPITAL_COMMUNITY): Admission: RE | Admit: 2021-11-13 | Payer: Medicare Other | Source: Ambulatory Visit

## 2021-11-13 DIAGNOSIS — R188 Other ascites: Secondary | ICD-10-CM

## 2021-11-13 LAB — CULTURE, BODY FLUID W GRAM STAIN -BOTTLE: Culture: NO GROWTH

## 2021-11-13 NOTE — Telephone Encounter (Signed)
Spoke to pt, daughter Letta Median [facility]), informed her or recommendations. She states her mother is obtaining fluid at a rapid rate. She states the medications is not working. Informed her to have labs completed when she takes her mother to have paracentesis. Daughter would like to speak to provider. Labs entered into Epic.

## 2021-11-13 NOTE — Telephone Encounter (Signed)
Spoke with daughter. She is concerned that patient is going to continue to need weekly paras. Advised that I will hold off on placing standing orders and this can come from Venetia Night, NP when she returns next week.  Blood work results should also be back at that time and it can be determined if diuretics can be increased further.  Patient is actually scheduled for paracentesis today, but daughter states that was supposed to be tomorrow.  I gave her the number to central scheduling to call and have the paracentesis rescheduled to tomorrow as she is unable to take her today at 11 AM.  Routing to Venetia Night, NP for review when she returns.

## 2021-11-14 ENCOUNTER — Ambulatory Visit (HOSPITAL_COMMUNITY)
Admission: RE | Admit: 2021-11-14 | Discharge: 2021-11-14 | Disposition: A | Discharge status: Home / Self Care | Payer: Medicare Other | Source: Ambulatory Visit | Attending: Gastroenterology | Admitting: Gastroenterology

## 2021-11-14 ENCOUNTER — Other Ambulatory Visit (HOSPITAL_COMMUNITY)
Admission: RE | Admit: 2021-11-14 | Discharge: 2021-11-14 | Disposition: A | Discharge status: Home / Self Care | Payer: Medicare Other | Source: Ambulatory Visit | Attending: Gastroenterology | Admitting: Gastroenterology

## 2021-11-14 DIAGNOSIS — K7469 Other cirrhosis of liver: Secondary | ICD-10-CM | POA: Diagnosis not present

## 2021-11-14 DIAGNOSIS — R188 Other ascites: Secondary | ICD-10-CM | POA: Insufficient documentation

## 2021-11-14 LAB — GRAM STAIN

## 2021-11-14 LAB — BASIC METABOLIC PANEL
Anion gap: 8 (ref 5–15)
BUN: 15 mg/dL (ref 8–23)
CO2: 22 mmol/L (ref 22–32)
Calcium: 8.8 mg/dL — ABNORMAL LOW (ref 8.9–10.3)
Chloride: 104 mmol/L (ref 98–111)
Creatinine, Ser: 0.73 mg/dL (ref 0.44–1.00)
GFR, Estimated: >60 mL/min (ref >60)
Glucose, Bld: 149 mg/dL — ABNORMAL HIGH (ref 70–99)
Potassium: 4.2 mmol/L (ref 3.5–5.1)
Sodium: 134 mmol/L — ABNORMAL LOW (ref 135–145)

## 2021-11-14 LAB — MAGNESIUM: Magnesium: 1.5 mg/dL — ABNORMAL LOW (ref 1.7–2.4)

## 2021-11-14 NOTE — Progress Notes (Signed)
Paracentesis complete no signs of distress.  

## 2021-11-14 NOTE — Procedures (Signed)
   US guided LLQ paracentesis 4.7 L yellow fluid obtained  Sent for labs per ordering MD Tolerated well  EBL: none

## 2021-11-15 LAB — CYTOLOGY - NON PAP

## 2021-11-18 ENCOUNTER — Other Ambulatory Visit: Payer: Self-pay | Admitting: *Deleted

## 2021-11-18 DIAGNOSIS — R188 Other ascites: Secondary | ICD-10-CM

## 2021-11-18 NOTE — Telephone Encounter (Signed)
No, the daughter has been updated today based on your result note. c

## 2021-11-19 LAB — BODY FLUID CELL COUNT WITH DIFFERENTIAL
Eos, Fluid: 0 %
Lymphs, Fluid: 49 %
Monocyte-Macrophage-Serous Fluid: 29 % — ABNORMAL LOW (ref 50–90)
Neutrophil Count, Fluid: 22 % (ref 0–25)
Total Nucleated Cell Count, Fluid: 228 cu mm (ref 0–1000)

## 2021-11-19 LAB — CULTURE, BODY FLUID W GRAM STAIN -BOTTLE: Culture: NO GROWTH

## 2021-11-21 ENCOUNTER — Encounter (HOSPITAL_COMMUNITY): Payer: Self-pay

## 2021-11-21 ENCOUNTER — Ambulatory Visit (HOSPITAL_COMMUNITY)
Admission: RE | Admit: 2021-11-21 | Discharge: 2021-11-21 | Disposition: A | Discharge status: Home / Self Care | Payer: Medicare Other | Source: Ambulatory Visit | Attending: Gastroenterology | Admitting: Gastroenterology

## 2021-11-21 VITALS — BP 127/99 | HR 102 | Temp 97.8°F | Resp 18

## 2021-11-21 DIAGNOSIS — R188 Other ascites: Secondary | ICD-10-CM | POA: Insufficient documentation

## 2021-11-21 DIAGNOSIS — K7581 Nonalcoholic steatohepatitis (NASH): Secondary | ICD-10-CM | POA: Insufficient documentation

## 2021-11-21 DIAGNOSIS — K7460 Unspecified cirrhosis of liver: Secondary | ICD-10-CM | POA: Insufficient documentation

## 2021-11-21 DIAGNOSIS — K7469 Other cirrhosis of liver: Secondary | ICD-10-CM | POA: Diagnosis not present

## 2021-11-21 LAB — BODY FLUID CELL COUNT WITH DIFFERENTIAL
Eos, Fluid: 0 %
Lymphs, Fluid: 60 %
Monocyte-Macrophage-Serous Fluid: 18 % — ABNORMAL LOW (ref 50–90)
Neutrophil Count, Fluid: 22 % (ref 0–25)
Total Nucleated Cell Count, Fluid: 143 cu mm (ref 0–1000)

## 2021-11-21 LAB — GRAM STAIN

## 2021-11-21 NOTE — Progress Notes (Signed)
PT tolerated right sided paracentesis procedure well today and 3.6 Liters of clear yellow fluid removed and labs collected and sent for processing. Pt verbalized understanding of discharge instructions and left with family via wheelchair with no acute distress noted.

## 2021-11-21 NOTE — Procedures (Signed)
PreOperative Dx: Cirrhosis due to NASH, ascites Postoperative Dx: Cirrhosis due to NASH, ascites Procedure:   US guided paracentesis Radiologist:  Thornton Papas Anesthesia:  10 ml of1% lidocaine Specimen:  3.6 L of yellow ascitic fluid EBL:   < 1 ml Complications: None

## 2021-11-25 DIAGNOSIS — E1169 Type 2 diabetes mellitus with other specified complication: Secondary | ICD-10-CM | POA: Diagnosis not present

## 2021-11-25 DIAGNOSIS — Z6831 Body mass index (BMI) 31.0-31.9, adult: Secondary | ICD-10-CM | POA: Diagnosis not present

## 2021-11-25 DIAGNOSIS — K7469 Other cirrhosis of liver: Secondary | ICD-10-CM | POA: Diagnosis not present

## 2021-11-25 DIAGNOSIS — I10 Essential (primary) hypertension: Secondary | ICD-10-CM | POA: Diagnosis not present

## 2021-11-25 NOTE — Progress Notes (Signed)
   Portal Hypertension Clinic Screening Evaluation   Indication for evaluation: Brooke Gutierrez is a 86 y.o. female undergoing preliminary evaluation in the [facility]iology Portal Hypertension Clinic due to recurrent ascites.  Referring Physician/Established Gastroenterologist:  Venetia Night, NP [facility])  Etiology of cirrhosis: NASH Initially diagnosed: 2015 # of paracentesis in last month: 4 # of paracentesis in last 2 months: 5 History of hepatic hydrothorax:  No History of hepatic encephalopathy: No  Prior evaluation for liver transplant: No History of hepatocellular carcinoma: No  Prior esophagogastroduodenoscopy/intervention: 2016 (OSH) - banded esophageal varices, portal gastropathy Current esophageal varices: yes Current gastric varices: no History of hematemesis: no  Current diuretic regimen: furosemide 20 mg QD, spironolactone 75 mg QD Current pharmacologic encephalopathy prophylaxis/treatment: lactulose 10 g BID  History of renal dysfunction: no History of hemodialysis: no  History of cardiac dysfunction: no  Other pertinent past medical history: CHF, COPD, diabetes, hyperlipidemia, hx of Hodgkin's    Imaging: Prior cross sectional imaging of portal system: US liver 10/08/21 - patent portal system  Echocardiogram:  03/08/21 [facility]) Summary    1. The left ventricle is normal in size with normal wall thickness.    2. The left ventricular systolic function is normal, LVEF is visually  estimated at 65-70%.    3. The right ventricle is normal in size, with normal systolic function.    4. Aortic valve sclerosis without significant stenosis.    Labs: 11/14/21 Creatinine: 0.73 Total Bilirubin: 5.6 INR: 1.8 Sodium: 134 Albumin: 2.8 BNP: 141  Child-Pugh = 11 points, class C MELD = 20 (estimated 3 month mortality of 19.6%) [place] Index of Post-TIPS Survival (FIPS) = 1.19 (overall survival estimated at 1  month 85.9%, 3 months 59.6%, and 6 months 45.7%)    Assessment: Brooke Gutierrez is a 86 y.o. 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  After preliminary evaluation, this patient is a marginal candidate for TIPS creation due to age, hyperbilirubinemia, and mild right sided congestive heart failure in addition to currently not maximized on diuretics.  Recommendation: Agree with continued maximization of diuretics prior to any consideration of TIPS creation.  Follow up in [facility] Portal Hypertension Clinic will be arranged in 6 months for re-evaluation.    Electronically Signed: Suzette Battiest, MD 11/25/2021, 8:08 AM

## 2021-11-25 NOTE — Progress Notes (Unsigned)
GI Office Note    Referring Provider: Neale Burly, MD Primary Care Physician:  Neale Burly, MD Primary Gastroenterologist: Dr. Gala Romney  Date:  11/26/2021  ID:  Brooke Gutierrez, DOB 1935-03-28, MRN [id number]   Chief Complaint   Chief Complaint  Patient presents with   Follow-up    History of Present Illness  Brooke Gutierrez is a 86 y.o. female with a history of cirrhosis, thrombocytopenia, esophageal varices, Hodgkin's disease with mixed cellularity (stage IIIb diagnosed in 2008), type 2 diabetes, HLD, COPD presenting today for follow-up of cirrhosis and ascites.  EGD 2016 via [facility].  EGD and colonoscopy with esophageal/gastric varices, gastritis.  Operative notes were not available.  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 Laural Golden in 2012.   Normal echocardiogram in December 2022.  Initial hospitalization 7/13 - 7/15 with ascites, peripheral edema, shortness of breath.  She was seen by our service during this time.  Patient and daughter reported progressive symptoms for 1 month prior to admission and had noted to prior admissions this year for dyspnea and possible acute on chronic congestive heart failure with lower extremity edema.  He had also had recent admission at [facility] 7/3 - 7/4 for bilateral lower extremity edema that was treated with Lasix and electrolyte correction.  She was previously on Lasix but her dose was decreased due to severe hypokalemia and hypomagnesemia.  She was discharged home with instructions to take every other day as well as oral potassium.  She was placed on spironolactone 12.5 mg daily at that time.  She developed rapid fluid retention with a 30 pound weight gain a little over a week after discharge and again presented with hypokalemia and hypomagnesemia.  She again was treated with IV Lasix and will try collection underwent ultrasound paracentesis which was negative for  SBP.  She was discharged home on Lasix 20 mg 3 times weekly and Aldactone was increased to 25 mg daily with frequent electrolyte checks.  Abdominal ultrasound with Doppler also ordered.  Seen in clinic 09/25/2021 with concern for increased abdominal girth and peripheral edema since her last paracentesis.  At this time abdominal girth and swelling was reported to be about the same, only about a 7 pound weight gain since hospitalization.  She reported following a low-sodium diet and taking Lasix every other day with spironolactone 25 mg daily and 10 mEq of potassium daily.  MELD sodium at that time was 18, Child-Pugh C.  No evidence of HD or GI bleeding.  Labs and abdominal ultrasound ordered.  Repeat hepatitis serologies ordered to check for immunity.  Also advised to continue Coreg for bleeding prophylaxis.  She was advised to follow-up in 2 weeks to determine need for paracentesis.  She is found to have no evidence of active hepatitis B or C.  She is immune to hepatitis A but not hepatitis B.  Abdominal ultrasound with liver Doppler performed 10/08/2021 with evidence of cirrhosis, patent portal system and moderate ascites with mild splenomegaly without any focal liver lesion.  Prior colonoscopy records received from [facility] dated 06/21/2013 in which she was noted to have mild diverticulosis in the left colon and internal hemorrhoids.  EGD performed 06/16/2014 noting portal hypertensive gastropathy, 2 columns of large varices in the distal esophagus, hiatal hernia without reported banding however recommendations stated repeat banding in 2-3 weeks and to continue nadolol.  Subsequent EGD 07/07/2014 with 2 columns of medium sized varices in  distal esophagus, mild gastritis in the antrum, and advise repeat EGD in 1 year.  Magnesium levels: 09/20/2021        2.1 09/21/2021        1.6 09/30/2021        1.1 10/08/2021          1.2 10/22/2021        1.7 11/05/2021 1.6 11/14/2021 1.5  After review of labs from  10/22/2021 she was recommended increase her Lasix 20 mg every other day and spironolactone to 50 mg daily continue current dose of magnesium 3 times daily with plan to repeat in 1 week.  ED visit 10/31/2021 due to not feeling well, shortness of breath, swelling.  She has stable labs, normal kidney function, electrolytes were within normal limits.  Negative chest x-ray.  Paracentesis performed with 4.5 L of fluid removal.  She is recommended to have magnesium rechecked prior to office visit.  Last office visit 11/07/2021: Patient is weight is ranging between 147-163 pounds.  She continues to complain of lower leg edema and has been increasingly short of breath recently.  Also reported some chest pain that week prior which she has having difficulty breathing.  Also noting lack of appetite as well as nausea due to increased abdominal swelling.  Had not had a bowel movement in 1 week and complained of chronic fatigue.  Per review of diet she was not following much of a heart healthy, low-sodium diet.  She was drinking at least 1 Glucerna a day and eating some peanut butter.  We discussed safety concerns at home, and working on getting extra help at home as her daughter had concerns about increasing her diuretics due to not having enough help to make frequent trips to the bathroom.  Patient's daughter Brooke Gutierrez, and patient's other daughter check-in on her during the week and her daughter Brooke Gutierrez accompanies her to all of her visits.  Patient's other daughter had recently had surgery therefore she could not be of assistance recently.  Repeat paracentesis ordered, she was started on lactulose 15 mg twice daily and advised to keep weight log at home.  She is advised to continue Lasix 20 mg daily and increase prolactin to 75 mg daily and to repeat BMP and magnesium in 1 week.  Also advised strict low-sodium diet, increasing protein, increase to drinking 2 Glucerna's per day.  Continue Coreg and advised to initiate some home health  services.  Patient's daughter reported patient had hepatitis B vaccine at [facility] drug in [place] in 2014.  Today:  PCP - requested albumin for next paracenteses as well as Lipids, CMP, A1c.  Has been increasing magnesium rich foods and has been eating better and more frequently. Swelling in her legs are improved and not urinating more frequently. Eating lots of beans and that is baseline. Breathing has been improved lately. Energy level is about the same.  Continues to drink Glucernas.  Her mobility is about the same as previous but reports overall she is feeling much better.  Started having bowel movements on her own. Has been drinking prune juice and eating prunes to help. Has not been taking the lactulose and not needed it.   Has 3 more paracenteses scheduled on Thursdays. Daughter and family are preparing meals for her and patients husband.  They have had difficulty in initiating any home health services.  Reports that [employer] was a year wait list.  There have been issues with finding companies and adequate enough staff  based on what insurance will cover.  Weights at home: Lately has been ranging 138-148 lbs. average over the last 7 to 10 days is 141 to 148 pounds  Current Outpatient Medications  Medication Sig Dispense Refill   carvedilol (COREG) 3.125 MG tablet Take 1 tablet (3.125 mg total) by mouth 2 (two) times daily. (Patient taking differently: Take 6.25 mg by mouth 2 (two) times daily.) 60 tablet 1   ferrous sulfate 325 (65 FE) MG tablet Take 325 mg by mouth daily with breakfast.     furosemide (LASIX) 20 MG tablet Take 1 tablet (20 mg total) by mouth daily. 30 tablet 1   insulin glargine (LANTUS) 100 UNIT/ML injection Inject 5-15 Units into the skin at bedtime. Sliding scale     insulin lispro (HUMALOG) 100 UNIT/ML injection Inject 10 Units into the skin 3 (three) times daily with meals. Sliding scale     magnesium chloride (SLOW-MAG) 64 MG TBEC SR tablet Take 2  tablets (128 mg total) by mouth in the morning, at noon, and at bedtime. 84 tablet 0   potassium chloride (KLOR-CON) 10 MEQ tablet Take 1 tablet (10 mEq total) by mouth daily. Take While taking Lasix/furosemide 30 tablet 2   pravastatin (PRAVACHOL) 20 MG tablet Take 20 mg by mouth every evening.     spironolactone (ALDACTONE) 25 MG tablet Take 3 tablets (75 mg total) by mouth daily. 60 tablet 1   Current Facility-Administered Medications  Medication Dose Route Frequency Provider Last Rate Last Admin   lactulose (CHRONULAC) 10 GM/15ML solution 10 g  10 g Oral BID Sherron Monday, NP        Past Medical History:  Diagnosis Date   CHF (congestive heart failure) ([place])    ?normal ECHO 02/2021   Cirrhosis ([place])    COPD (chronic obstructive pulmonary disease) (H[facility])    Diabetes mellitus without complication ([facility])    HLD (hyperlipidemia)     Past Surgical History:  Procedure Laterality Date   ABDOMINAL HYSTERECTOMY     APPENDECTOMY     BLADDER REPAIR     CESAREAN SECTION     COLONOSCOPY     ESOPHAGOGASTRODUODENOSCOPY     GALLBLADDER SURGERY      No family history on file.  Allergies as of 11/26/2021 - Review Complete 11/26/2021  Allergen Reaction Noted   Ciprofloxacin  09/30/2018   Levaquin [levofloxacin]  09/30/2018   Sulfa antibiotics  09/30/2018   Other Rash 08/03/2019    Social History   Socioeconomic History   Marital status: Married    Spouse name: Not on file   Number of children: Not on file   Years of education: Not on file   Highest education level: Not on file  Occupational History   Not on file  Tobacco Use   Smoking status: Never   Smokeless tobacco: Never  Vaping Use   Vaping Use: Never used  Substance and Sexual Activity   Alcohol use: Never   Drug use: Never   Sexual activity: Not on file  Other Topics Concern   Not on file  Social History Narrative   Not on file   Social Determinants of Health   Financial Resource Strain: Not on file  Food  Insecurity: Not on file  Transportation Needs: Not on file  Physical Activity: Not on file  Stress: Not on file  Social Connections: Not on file     Review of Systems   Gen: Denies fever, chills, anorexia. Denies fatigue, weakness, weight loss.  CV: Denies chest pain, palpitations, syncope, peripheral edema, and claudication. Resp: Denies dyspnea at rest, cough, wheezing, coughing up blood, and pleurisy. GI: See HPI Derm: Denies rash, itching, dry skin Psych: Denies depression, anxiety, memory loss, confusion. No homicidal or suicidal ideation.  Heme: Denies bruising, bleeding, and enlarged lymph nodes.   Physical Exam   BP 132/85 (BP Location: Right Arm, Patient Position: Sitting, Cuff Size: Normal)   Pulse 74   Temp 97.9 F (36.6 C) (Temporal)   Wt 148 lb 6.4 oz (67.3 kg)   SpO2 96%   BMI 27.14 kg/m   General:   Alert and oriented. No distress noted. Pleasant and cooperative.  Head:  Normocephalic and atraumatic. Eyes:  Conjuctiva clear without scleral icterus. Mouth:  Oral mucosa pink and moist. Good dentition. No lesions. Lungs:  Clear to auscultation bilaterally. No wheezes, rales, or rhonchi. No distress.  Heart:  S1, S2 present without murmurs appreciated.  Abdomen:  +BS, soft, mildly distended and nontender.  No rebound or guarding. No HSM or masses noted. Rectal: deferred Msk:  Symmetrical without gross deformities. Normal posture. Extremities: Mild nonpitting edema to bilateral lower extremities. Neurologic:  Alert and  oriented x4 Psych:  Alert and cooperative. Normal mood and affect.   Assessment  KEYLIE BEAVERS is a 86 y.o. female with a history of cirrhosis, thrombocytopenia, esophageal varices, Hodgkin's disease with mixed cellularity (stage IIIb diagnosed in 2008), type 2 diabetes, HLD, COPD presenting today for follow-up of cirrhosis and ascites.  NASH cirrhosis with ascites/lower extremity edema: Most recent MELD 21, Child-Pugh C.  Diagnosed with  Brooke Gutierrez 12 to 13 years prior.  Work-up negative for active or chronic viral hepatitis.  Received vaccination for hepatitis B in 2014.  Her course has been complicated by thrombocytopenia, elevated INR, esophageal and gastric varices with banding in 2016 and recurrence of ascites requiring multiple paracenteses due to inadequate diuretic therapy and electrolyte abnormalities as well as peripheral edema.  She had multiple hospitalizations this year with most recent visit on 10/31/2021 due to shortness of breath and abdominal swelling.  We have been working toward optimization of diuretic therapy since her previous hospitalizations.  At her last visit her weights were ranging from 147 pounds to 163 pounds.  Currently her daily weights since her last appointment are ranging from 138 pounds to 148 pounds.  Patient appears much improved and appears more clinically stable than at her last visit on 11/07/2021.  At her last visit I increased her spironolactone to 75 mg daily and advised her to continue Lasix 20 mg daily.  Repeat labs performed revealing magnesium of 1.5 and stable potassium.  She had recent paracentesis on 11/21/2021 which was negative for SBP.  Thus far she has weekly paracenteses ordered on Thursdays for the next 3 weeks.  No evidence of hepatic encephalopathy, no asterixis present today. Today we will add on 12.5 mg of albumin 25% to her paracentesis orders to be given if she has more than 5 L removed per paracentesis.  They have been following previous dietary recommendations to increase magnesium rich foods and increase protein.  We will recheck labs next week with CMP and magnesium as well as labs requested by PCP including lipid panel and A1c.  She is to continue Glucerna shakes as well as Coreg for variceal prophylaxis.  Although she still has somewhat of a lack appetite and some fatigue she is much improved from prior.  We will follow-up with labs and adjust diuretic therapy and/or electrolyte replacement  as needed.  We will follow-up in the office in 4 weeks.  Constipation: At her previous appointment, it had been 1 week that she had had a bowel movement.  Patient daughter states she had a large bowel movement hours after her last exam and since then she has been having daily soft bowel movements.  Has not had any recent toilet tissue hematochezia.  Due to her large bowel movement, she never started the lactulose that was ordered at her last appointment.  Occasionally eats prunes and or drinks prune juice and has not had any more difficulty.  She also has had some slight change in her diet, patient's daughters have been fixing meals for them daily.  Continue to work toward home health services.  Daughter reported today that [employer] is 1 year out and they are working on finding a company with adequate staffing services that are covered by her insurance in the area.   PLAN   Add 12.5g of albumin 25% prn if removing more than 5 L of ascites with paracenteses.  Continue as needed paracenteses with goal to space out frequency with optimization of diuretics CMP, Lipid panel, A1c, Magnesium next week. Continue Spironolactone 75 mg daily, refilled.  Continue Lasix 20 mg daily Continue Coreg 6.25 mg twice daily for variceal bleeding prophylaxis Continue high-protein, low-fat, low-sodium diet. Continue to avoid alcohol and NSAIDs Limit Tylenol to no more than 2000 mg/day Continue Glucerna -2 shakes per day Continue to monitor weights Follow-up in 4 weeks    Venetia Night, MSN, FNP-BC, AGACNP-BC [facility]

## 2021-11-26 ENCOUNTER — Encounter: Payer: Self-pay | Admitting: Gastroenterology

## 2021-11-26 ENCOUNTER — Ambulatory Visit (INDEPENDENT_AMBULATORY_CARE_PROVIDER_SITE_OTHER): Payer: Medicare Other | Admitting: Gastroenterology

## 2021-11-26 VITALS — BP 132/85 | HR 74 | Temp 97.9°F | Wt 148.4 lb

## 2021-11-26 DIAGNOSIS — K5900 Constipation, unspecified: Secondary | ICD-10-CM | POA: Diagnosis not present

## 2021-11-26 DIAGNOSIS — K7581 Nonalcoholic steatohepatitis (NASH): Secondary | ICD-10-CM | POA: Diagnosis not present

## 2021-11-26 DIAGNOSIS — K7460 Unspecified cirrhosis of liver: Secondary | ICD-10-CM | POA: Diagnosis not present

## 2021-11-26 DIAGNOSIS — R600 Localized edema: Secondary | ICD-10-CM | POA: Diagnosis not present

## 2021-11-26 DIAGNOSIS — R188 Other ascites: Secondary | ICD-10-CM

## 2021-11-26 LAB — CULTURE, BODY FLUID W GRAM STAIN -BOTTLE: Culture: NO GROWTH

## 2021-11-26 MED ORDER — SPIRONOLACTONE 25 MG PO TABS: 75.0000 mg | ORAL_TABLET | Freq: Every day | ORAL | 1 refills | Status: DC

## 2021-11-26 NOTE — Patient Instructions (Addendum)
I am ordering your labs to have completed next week.  I will recheck your electrolytes as well as your lipid panel and A1c per the request of your PCP.  I will have him add in for her to receive albumin if they remove more than 5 L per paracentesis.  Continue to monitor your weights daily.  Continue spironolactone 75 mg daily -refilled today, Coreg 6.'25mg'$  twice daily, and Lasix 20 mg daily.  After you recheck your electrolytes again, I will make further recommendations on any dosage changes then.  I will have you follow-up in the clinic in 4 weeks or sooner if needed.  We will continue frequent follow-ups until we have been able to maximize diuretic therapy.  It was a pleasure to see you today. I want to create trusting relationships with patients. If you receive a survey regarding your visit,  I greatly appreciate you taking time to fill this out on paper or through your MyChart. I value your feedback.  Venetia Night, MSN, FNP-BC, AGACNP-BC [facility]

## 2021-11-26 NOTE — Addendum Note (Signed)
Addended by: Sherron Monday on: 11/26/2021 03:50 PM   Modules accepted: Orders

## 2021-11-27 LAB — CYTOLOGY - NON PAP

## 2021-11-28 ENCOUNTER — Encounter (HOSPITAL_COMMUNITY): Payer: Self-pay

## 2021-11-28 ENCOUNTER — Ambulatory Visit (HOSPITAL_COMMUNITY)
Admission: RE | Admit: 2021-11-28 | Discharge: 2021-11-28 | Disposition: A | Discharge status: Home / Self Care | Payer: Medicare Other | Source: Ambulatory Visit | Attending: Gastroenterology | Admitting: Gastroenterology

## 2021-11-28 DIAGNOSIS — R188 Other ascites: Secondary | ICD-10-CM | POA: Insufficient documentation

## 2021-11-28 LAB — BODY FLUID CELL COUNT WITH DIFFERENTIAL
Eos, Fluid: 0 %
Lymphs, Fluid: 69 %
Monocyte-Macrophage-Serous Fluid: 13 % — ABNORMAL LOW (ref 50–90)
Neutrophil Count, Fluid: 18 % (ref 0–25)
Total Nucleated Cell Count, Fluid: 200 cu mm (ref 0–1000)

## 2021-11-28 LAB — GRAM STAIN

## 2021-11-28 MED ORDER — ALBUMIN HUMAN 25 % IV SOLN: 0.0000 g | Freq: Once | INTRAVENOUS | Status: DC

## 2021-11-28 NOTE — Procedures (Signed)
INDICATION: Ascites EXAM: ULTRASOUND GUIDED LEFT PARACENTESIS GRIP-IR: Category: Fluids  Subcategory: Paracentesis  Follow-Up: None  MEDICATIONS: None. COMPLICATIONS: None immediate. PROCEDURE: Informed written consent was obtained from the patient after a discussion of the risks, benefits and alternatives to treatment. A timeout was performed prior to the initiation of the procedure.  Initial ultrasound scanning demonstrates a large amount of ascites within the left lower abdominal quadrant. The right lower left was prepped and draped in the usual sterile fashion. 1% lidocaine  was used for local anesthesia.   Following this, a Yueh catheter was introduced. An ultrasound image was saved for documentation purposes. The paracentesis was performed. The catheter was removed and a dressing was applied. The patient tolerated the procedure well without immediate post procedural complication.   FINDINGS: A total of approximately 4.8 L of yellow fluid was removed. Samples were sent to the laboratory as requested by the clinical team. IMPRESSION:  Successful ultrasound-guided paracentesis yielding 4.8 liters of peritoneal fluid.

## 2021-11-28 NOTE — Progress Notes (Signed)
PT tolerated left sided paracentesis procedure well today and 4.8 Liters of clear yellow ascites removed and labs sent for processing. PT verbalized understanding of discharge instructions and left via wheelchair with family at this time with no acute distress noted.

## 2021-12-02 ENCOUNTER — Other Ambulatory Visit: Payer: Self-pay | Admitting: Gastroenterology

## 2021-12-02 DIAGNOSIS — R188 Other ascites: Secondary | ICD-10-CM

## 2021-12-02 LAB — CYTOLOGY - NON PAP

## 2021-12-03 ENCOUNTER — Other Ambulatory Visit: Payer: Self-pay | Admitting: *Deleted

## 2021-12-03 DIAGNOSIS — R188 Other ascites: Secondary | ICD-10-CM

## 2021-12-03 LAB — CULTURE, BODY FLUID W GRAM STAIN -BOTTLE: Culture: NO GROWTH

## 2021-12-04 DIAGNOSIS — R339 Retention of urine, unspecified: Secondary | ICD-10-CM | POA: Diagnosis not present

## 2021-12-05 ENCOUNTER — Other Ambulatory Visit (HOSPITAL_COMMUNITY)
Admission: RE | Admit: 2021-12-05 | Discharge: 2021-12-05 | Disposition: A | Discharge status: Home / Self Care | Payer: Medicare Other | Source: Ambulatory Visit | Attending: Gastroenterology | Admitting: Gastroenterology

## 2021-12-05 ENCOUNTER — Ambulatory Visit (HOSPITAL_COMMUNITY)
Admission: RE | Admit: 2021-12-05 | Discharge: 2021-12-05 | Disposition: A | Discharge status: Home / Self Care | Payer: Medicare Other | Source: Ambulatory Visit | Attending: Gastroenterology | Admitting: Gastroenterology

## 2021-12-05 ENCOUNTER — Other Ambulatory Visit: Payer: Self-pay | Admitting: Gastroenterology

## 2021-12-05 ENCOUNTER — Telehealth: Payer: Self-pay | Admitting: *Deleted

## 2021-12-05 ENCOUNTER — Encounter (HOSPITAL_COMMUNITY): Payer: Self-pay

## 2021-12-05 DIAGNOSIS — R188 Other ascites: Secondary | ICD-10-CM | POA: Insufficient documentation

## 2021-12-05 DIAGNOSIS — K7469 Other cirrhosis of liver: Secondary | ICD-10-CM | POA: Diagnosis not present

## 2021-12-05 DIAGNOSIS — R7989 Other specified abnormal findings of blood chemistry: Secondary | ICD-10-CM | POA: Diagnosis not present

## 2021-12-05 DIAGNOSIS — K7460 Unspecified cirrhosis of liver: Secondary | ICD-10-CM | POA: Diagnosis not present

## 2021-12-05 LAB — LIPID PANEL
Cholesterol: 120 mg/dL (ref 0–200)
HDL: 25 mg/dL — ABNORMAL LOW (ref >40)
LDL Cholesterol: 76 mg/dL (ref 0–99)
Total CHOL/HDL Ratio: 4.8 RATIO
Triglycerides: 97 mg/dL (ref <150)
VLDL: 19 mg/dL (ref 0–40)

## 2021-12-05 LAB — COMPREHENSIVE METABOLIC PANEL
ALT: 18 U/L (ref 0–44)
AST: 34 U/L (ref 15–41)
Albumin: 2.2 g/dL — ABNORMAL LOW (ref 3.5–5.0)
Alkaline Phosphatase: 122 U/L (ref 38–126)
Anion gap: 9 (ref 5–15)
BUN: 19 mg/dL (ref 8–23)
CO2: 26 mmol/L (ref 22–32)
Calcium: 8.3 mg/dL — ABNORMAL LOW (ref 8.9–10.3)
Chloride: 96 mmol/L — ABNORMAL LOW (ref 98–111)
Creatinine, Ser: 1.06 mg/dL — ABNORMAL HIGH (ref 0.44–1.00)
GFR, Estimated: 52 mL/min — ABNORMAL LOW (ref >60)
Glucose, Bld: 170 mg/dL — ABNORMAL HIGH (ref 70–99)
Potassium: 3.8 mmol/L (ref 3.5–5.1)
Sodium: 131 mmol/L — ABNORMAL LOW (ref 135–145)
Total Bilirubin: 4.6 mg/dL — ABNORMAL HIGH (ref 0.3–1.2)
Total Protein: 5.6 g/dL — ABNORMAL LOW (ref 6.5–8.1)

## 2021-12-05 LAB — BODY FLUID CELL COUNT WITH DIFFERENTIAL
Eos, Fluid: 0 %
Lymphs, Fluid: 35 %
Monocyte-Macrophage-Serous Fluid: 28 % — ABNORMAL LOW (ref 50–90)
Neutrophil Count, Fluid: 37 % — ABNORMAL HIGH (ref 0–25)
Total Nucleated Cell Count, Fluid: 149 cu mm (ref 0–1000)

## 2021-12-05 LAB — GRAM STAIN

## 2021-12-05 LAB — HEMOGLOBIN A1C
Hgb A1c MFr Bld: 4.9 % (ref 4.8–5.6)
Mean Plasma Glucose: 93.93 mg/dL

## 2021-12-05 LAB — MAGNESIUM: Magnesium: 1.1 mg/dL — ABNORMAL LOW (ref 1.7–2.4)

## 2021-12-05 MED ORDER — MAGNESIUM LACTATE 84 MG (7MEQ) PO TBCR: 168.0000 mg | EXTENDED_RELEASE_TABLET | Freq: Three times a day (TID) | ORAL | 2 refills | Status: DC

## 2021-12-05 MED ORDER — ALBUMIN HUMAN 25 % IV SOLN
0.0000 g | Freq: Once | INTRAVENOUS | Status: DC
  Filled 2021-12-05: qty 400

## 2021-12-05 NOTE — Progress Notes (Signed)
PT tolerated left sided paracentesis procedure well today and 4.9 Liters of clear yellow ascites removed with labs collected and sent for processing. PT verbalized understanding of discharge instructions and left with via wheelchair with daughter with no acute distress noted.

## 2021-12-05 NOTE — Procedures (Signed)
   US guided LLQ paracentesis  4.9 Liters yellow fluid obtained Sent for labs Tolerated well  EBL: None

## 2021-12-05 NOTE — Telephone Encounter (Signed)
Pt's daughter Brooke Gutierrez [facility]) called and states her mother it out of magnesium chloride and needs a prescription sent to [facility] in [place].

## 2021-12-06 NOTE — Telephone Encounter (Signed)
Noted  

## 2021-12-09 ENCOUNTER — Other Ambulatory Visit: Payer: Self-pay | Admitting: Gastroenterology

## 2021-12-09 ENCOUNTER — Telehealth: Payer: Self-pay | Admitting: Gastroenterology

## 2021-12-09 ENCOUNTER — Telehealth: Payer: Self-pay | Admitting: *Deleted

## 2021-12-09 DIAGNOSIS — R188 Other ascites: Secondary | ICD-10-CM

## 2021-12-09 DIAGNOSIS — E876 Hypokalemia: Secondary | ICD-10-CM

## 2021-12-09 LAB — CYTOLOGY - NON PAP

## 2021-12-09 MED ORDER — MAGNESIUM OXIDE 400 MG PO CAPS: 400.0000 mg | ORAL_CAPSULE | Freq: Two times a day (BID) | ORAL | 1 refills | Status: DC

## 2021-12-09 MED ORDER — MAGNESIUM OXIDE -MG SUPPLEMENT 400 (240 MG) MG PO TABS: 400.0000 mg | ORAL_TABLET | Freq: Two times a day (BID) | ORAL | Status: DC

## 2021-12-09 NOTE — Telephone Encounter (Signed)
Noted! Thank you

## 2021-12-09 NOTE — Telephone Encounter (Signed)
Spoke to patient's daughter Katha Cabal.  She reports [facility] has been unable to get Mag-Tab SR.  Medication was ordered last week since patient was out of prior magnesium supplementation and last lab work was magnesium 1.1 on 12/05/2021.  Given no significant improvement with Slow-Mag 228 mg 3 times daily and inability to get Mag-Tab, will trial magnesium oxide 400 mg twice daily and recheck magnesium level in 1 week from today.   Venetia Night, MSN, APRN, FNP-BC, AGACNP-BC [facility]

## 2021-12-09 NOTE — Telephone Encounter (Signed)
Pt's daughter Letta Median [facility]) called and states the [facility] can't fill the magnesium that was prescribed. Please advise.

## 2021-12-10 LAB — CULTURE, BODY FLUID W GRAM STAIN -BOTTLE: Culture: NO GROWTH

## 2021-12-11 ENCOUNTER — Encounter (HOSPITAL_COMMUNITY): Payer: Self-pay

## 2021-12-11 ENCOUNTER — Ambulatory Visit (HOSPITAL_COMMUNITY)
Admission: RE | Admit: 2021-12-11 | Discharge: 2021-12-11 | Disposition: A | Discharge status: Home / Self Care | Payer: Medicare Other | Source: Ambulatory Visit | Attending: Gastroenterology | Admitting: Gastroenterology

## 2021-12-11 DIAGNOSIS — R188 Other ascites: Secondary | ICD-10-CM | POA: Insufficient documentation

## 2021-12-11 DIAGNOSIS — K7460 Unspecified cirrhosis of liver: Secondary | ICD-10-CM | POA: Diagnosis not present

## 2021-12-11 LAB — BODY FLUID CELL COUNT WITH DIFFERENTIAL
Eos, Fluid: 0 %
Lymphs, Fluid: 52 %
Monocyte-Macrophage-Serous Fluid: 29 % — ABNORMAL LOW (ref 50–90)
Neutrophil Count, Fluid: 19 % (ref 0–25)
Total Nucleated Cell Count, Fluid: 119 cu mm (ref 0–1000)

## 2021-12-11 LAB — GRAM STAIN

## 2021-12-11 MED ORDER — ALBUMIN HUMAN 25 % IV SOLN: 0.0000 g | Freq: Once | INTRAVENOUS | Status: DC

## 2021-12-11 NOTE — Procedures (Signed)
PreOperative Dx: Cirrhosis, ascites Postoperative Dx: Cirrhosis, ascites Procedure:   US guided paracentesis Radiologist:  Perrin Gens Anesthesia:  10 ml of1% lidocaine Specimen:  4.2 L of yellow ascitic fluid EBL:   < 1 ml Complications: None  

## 2021-12-11 NOTE — Progress Notes (Signed)
PT tolerated right sided paracentesis procedure well today and 4.2 liters of clear yellow ascites removed with labs collected and sent for processing. PT left via wheelchair with no acute distress noted with family and verbalized understanding of discharge instructions.

## 2021-12-12 ENCOUNTER — Ambulatory Visit (HOSPITAL_COMMUNITY): Payer: Medicare Other

## 2021-12-13 LAB — CYTOLOGY - NON PAP

## 2021-12-16 DIAGNOSIS — Z452 Encounter for adjustment and management of vascular access device: Secondary | ICD-10-CM | POA: Diagnosis not present

## 2021-12-16 LAB — CULTURE, BODY FLUID W GRAM STAIN -BOTTLE: Culture: NO GROWTH

## 2021-12-19 ENCOUNTER — Ambulatory Visit (HOSPITAL_COMMUNITY)
Admission: RE | Admit: 2021-12-19 | Discharge: 2021-12-19 | Disposition: A | Discharge status: Home / Self Care | Payer: Medicare Other | Source: Ambulatory Visit | Attending: Gastroenterology | Admitting: Gastroenterology

## 2021-12-19 ENCOUNTER — Encounter (HOSPITAL_COMMUNITY): Payer: Self-pay

## 2021-12-19 DIAGNOSIS — K7460 Unspecified cirrhosis of liver: Secondary | ICD-10-CM | POA: Diagnosis not present

## 2021-12-19 DIAGNOSIS — R188 Other ascites: Secondary | ICD-10-CM | POA: Insufficient documentation

## 2021-12-19 DIAGNOSIS — J301 Allergic rhinitis due to pollen: Secondary | ICD-10-CM | POA: Diagnosis not present

## 2021-12-19 DIAGNOSIS — Z6831 Body mass index (BMI) 31.0-31.9, adult: Secondary | ICD-10-CM | POA: Diagnosis not present

## 2021-12-19 LAB — BODY FLUID CELL COUNT WITH DIFFERENTIAL
Eos, Fluid: 1 %
Lymphs, Fluid: 63 %
Monocyte-Macrophage-Serous Fluid: 23 % — ABNORMAL LOW (ref 50–90)
Neutrophil Count, Fluid: 13 % (ref 0–25)
Total Nucleated Cell Count, Fluid: 118 cu mm (ref 0–1000)

## 2021-12-19 LAB — GRAM STAIN

## 2021-12-19 MED ORDER — ALBUMIN HUMAN 25 % IV SOLN
INTRAVENOUS | Status: AC
  Filled 2021-12-19: qty 200

## 2021-12-19 MED ORDER — ALBUMIN HUMAN 25 % IV SOLN
0.0000 g | Freq: Once | INTRAVENOUS | Status: AC
  Administered 2021-12-19: 50 g via INTRAVENOUS
  Filled 2021-12-19: qty 400

## 2021-12-19 MED ORDER — SODIUM CHLORIDE FLUSH 0.9 % IV SOLN
INTRAVENOUS | Status: AC
  Filled 2021-12-19: qty 10

## 2021-12-19 NOTE — Procedures (Signed)
PreOperative Dx: Cirrhosis, ascites Postoperative Dx: Cirrhosis, ascites Procedure:   US guided paracentesis Radiologist:  Thornton Papas Anesthesia:  10 ml of1% lidocaine Specimen:  4.3 L of yellow ascitic fluid EBL:   < 1 ml Complications:  None

## 2021-12-19 NOTE — Progress Notes (Signed)
Patient tolerated right sided paracentesis procedure and 50G of IV albumin well today and 4.3 Liters of clear yellow ascites removed with labs collected and sent for processing. PT verbalized understanding of discharge instructions and left with family via wheelchair at this time with no acute distress noted.

## 2021-12-24 LAB — CULTURE, BODY FLUID W GRAM STAIN -BOTTLE: Culture: NO GROWTH

## 2021-12-25 ENCOUNTER — Telehealth: Payer: Self-pay | Admitting: *Deleted

## 2021-12-25 NOTE — Telephone Encounter (Signed)
Pt's daughter Letta Median [facility]) called and states they have deiced to start her mother on Hospices. She states her mother's health has declined. Her and the [place] want's a order for a port to be placed so that she does not have to go the the hospital for paracentesis. Letta Median states it is to hard to get her in and out of car and to get her in the hospital and up on the table. Informed them that provider would be in the office in the morning. They voiced understanding and would call back tomorrow.

## 2021-12-26 ENCOUNTER — Ambulatory Visit: Payer: Medicare Other | Admitting: Gastroenterology

## 2021-12-26 ENCOUNTER — Encounter (HOSPITAL_COMMUNITY): Payer: Self-pay

## 2021-12-26 ENCOUNTER — Ambulatory Visit (HOSPITAL_COMMUNITY)
Admission: RE | Admit: 2021-12-26 | Discharge: 2021-12-26 | Disposition: A | Discharge status: Home / Self Care | Payer: Medicare Other | Source: Ambulatory Visit | Attending: Gastroenterology | Admitting: Gastroenterology

## 2021-12-26 DIAGNOSIS — K7460 Unspecified cirrhosis of liver: Secondary | ICD-10-CM | POA: Diagnosis not present

## 2021-12-26 DIAGNOSIS — R188 Other ascites: Secondary | ICD-10-CM | POA: Insufficient documentation

## 2021-12-26 LAB — BODY FLUID CELL COUNT WITH DIFFERENTIAL
Eos, Fluid: 0 %
Lymphs, Fluid: 55 %
Monocyte-Macrophage-Serous Fluid: 37 % — ABNORMAL LOW (ref 50–90)
Neutrophil Count, Fluid: 8 % (ref 0–25)
Total Nucleated Cell Count, Fluid: 89 cu mm (ref 0–1000)

## 2021-12-26 LAB — GRAM STAIN: Gram Stain: NONE SEEN

## 2021-12-26 MED ORDER — ALBUMIN HUMAN 25 % IV SOLN
0.0000 g | Freq: Once | INTRAVENOUS | Status: AC
  Filled 2021-12-26: qty 400

## 2021-12-26 MED ORDER — ALBUMIN HUMAN 25 % IV SOLN
INTRAVENOUS | Status: AC
  Administered 2021-12-26: 50 g via INTRAVENOUS
  Filled 2021-12-26: qty 200

## 2021-12-26 NOTE — Procedures (Signed)
   US guided LLQ paracentesis  5.6 L dark yellow fluid obtained Sent for labs per MD  Tolerated well  EBL: scant

## 2021-12-26 NOTE — Progress Notes (Signed)
Patient tolerated left sided paracentesis procedure and 50G of IV Albumin well today and 5.6 L of yellow ascites removed with labs collected and sent for processing. PT verbalized understanding of discharge instructions and pushed via wheelchair to main entrance to daughter at this time with no acute distress noted.

## 2021-12-27 NOTE — Telephone Encounter (Signed)
Noted  

## 2021-12-31 ENCOUNTER — Other Ambulatory Visit (HOSPITAL_COMMUNITY)
Admission: RE | Admit: 2021-12-31 | Discharge: 2021-12-31 | Disposition: A | Discharge status: Home / Self Care | Payer: Medicare Other | Source: Other Acute Inpatient Hospital | Attending: Adult Health Nurse Practitioner | Admitting: Adult Health Nurse Practitioner

## 2021-12-31 DIAGNOSIS — K7460 Unspecified cirrhosis of liver: Secondary | ICD-10-CM | POA: Insufficient documentation

## 2021-12-31 LAB — URINALYSIS, ROUTINE W REFLEX MICROSCOPIC
Bilirubin Urine: NEGATIVE
Glucose, UA: NEGATIVE mg/dL
Hgb urine dipstick: NEGATIVE
Ketones, ur: NEGATIVE mg/dL
Nitrite: NEGATIVE
Protein, ur: 100 mg/dL — AB
Specific Gravity, Urine: 1.017 (ref 1.005–1.030)
pH: 8 (ref 5.0–8.0)

## 2021-12-31 LAB — CULTURE, BODY FLUID W GRAM STAIN -BOTTLE: Culture: NO GROWTH

## 2021-12-31 LAB — CYTOLOGY - NON PAP

## 2022-01-01 ENCOUNTER — Ambulatory Visit (HOSPITAL_COMMUNITY)
Admission: RE | Admit: 2022-01-01 | Discharge: 2022-01-01 | Disposition: A | Discharge status: Home / Self Care | Source: Ambulatory Visit | Attending: Gastroenterology | Admitting: Gastroenterology

## 2022-01-01 ENCOUNTER — Encounter (HOSPITAL_COMMUNITY): Payer: Self-pay

## 2022-01-01 DIAGNOSIS — K7460 Unspecified cirrhosis of liver: Secondary | ICD-10-CM | POA: Diagnosis not present

## 2022-01-01 DIAGNOSIS — R188 Other ascites: Secondary | ICD-10-CM | POA: Insufficient documentation

## 2022-01-01 LAB — BODY FLUID CELL COUNT WITH DIFFERENTIAL
Eos, Fluid: 0 %
Lymphs, Fluid: 74 %
Monocyte-Macrophage-Serous Fluid: 20 % — ABNORMAL LOW (ref 50–90)
Neutrophil Count, Fluid: 6 % (ref 0–25)
Total Nucleated Cell Count, Fluid: 109 cu mm (ref 0–1000)

## 2022-01-01 LAB — GRAM STAIN

## 2022-01-01 MED ORDER — SODIUM CHLORIDE FLUSH 0.9 % IV SOLN
INTRAVENOUS | Status: AC
  Filled 2022-01-01: qty 10

## 2022-01-01 MED ORDER — ALBUMIN HUMAN 25 % IV SOLN
0.0000 g | Freq: Once | INTRAVENOUS | Status: DC
  Filled 2022-01-01: qty 400

## 2022-01-01 MED ORDER — ALBUMIN HUMAN 25 % IV SOLN
INTRAVENOUS | Status: AC
  Filled 2022-01-01: qty 200

## 2022-01-01 NOTE — Procedures (Signed)
   US guided LLQ paracentesis  2.6 L yellow fluid Sent for labs per MD  Tolerated well  EBL: scant

## 2022-01-01 NOTE — Progress Notes (Signed)
Patient tolerated left sided paracentesis procedure well today and 2.6 L of yellow ascites removed with labs collected and sent for processing. PT verbalized understanding of discharge instructions and pushed via wheelchair to main entrance to daughter at this time with no acute distress noted.

## 2022-01-02 ENCOUNTER — Encounter: Payer: Self-pay | Admitting: Gastroenterology

## 2022-01-02 ENCOUNTER — Ambulatory Visit (INDEPENDENT_AMBULATORY_CARE_PROVIDER_SITE_OTHER): Payer: Medicare Other | Admitting: Gastroenterology

## 2022-01-02 VITALS — BP 96/57 | HR 100 | Temp 97.5°F | Ht 63.0 in | Wt 138.0 lb

## 2022-01-02 DIAGNOSIS — I872 Venous insufficiency (chronic) (peripheral): Secondary | ICD-10-CM | POA: Diagnosis not present

## 2022-01-02 DIAGNOSIS — K7460 Unspecified cirrhosis of liver: Secondary | ICD-10-CM | POA: Diagnosis not present

## 2022-01-02 DIAGNOSIS — R600 Localized edema: Secondary | ICD-10-CM | POA: Diagnosis not present

## 2022-01-02 LAB — CYTOLOGY - NON PAP

## 2022-01-02 NOTE — Patient Instructions (Addendum)
I am ordering your labs to have completed next week at your next paracentesis. I will recheck your electrolytes and kidney function.    Continue to monitor your weights daily.   Nutrition:  High-protein diet from a primarily plant-based diet. Avoid red meat.  No raw or undercooked meat, seafood, or shellfish. Low-fat/cholesterol/carbohydrate diet. Limit sodium to no more than 2000 mg/day including everything that you eat and drink. Recommend at least 30 minutes of aerobic and resistance exercise 3 days/week.   Continue spironolactone 75 mg daily and Lasix 20 mg daily.  After you recheck your electrolytes again, I will make further recommendations on any dosage changes then.   I will have you follow-up in the clinic in 6 weeks or sooner if needed.  We will continue frequent follow-ups until we have been able to maximize diuretic therapy.  For consideration of Pleurx peritoneal catheter, the recommendation is to place a consult order to interventional radiology (Dr. Angela Adam) within the [facility] health system.  This consultation would be to discussed the risk and benefits of placing the peritoneal catheter and to determine candidacy.  Once a consult is placed, they will meet with you to discuss and then if all parties agree to proceed they will handle setting up the procedure and determining further orders.  Per further investigation, hospice provider would need to place a consult order to ensure insurance coverage.   Regardless, we still have weekly paracentesis orders placed as needed.  Even if new catheter is placed, the recommendation would still be to limit removal of more than 4 L of fluid at one time.  We would still continue with maximizing diuretic therapy is much as possible.  It appears you may also have some stasis dermatitis.  I would continue applying hydrocortisone daily.  It may be a good idea for you to also use compression stockings for couple of hours a day every day.  No need for  further antibiotics at this time.  It was a pleasure to see you today. I want to create trusting relationships with patients. If you receive a survey regarding your visit,  I greatly appreciate you taking time to fill this out on paper or through your MyChart. I value your feedback.  Brooke Night, MSN, FNP-BC, AGACNP-BC [facility]

## 2022-01-02 NOTE — Progress Notes (Signed)
GI Office Note    Referring Provider: Neale Burly, MD Primary Care Physician:  Neale Burly, MD Primary Gastroenterologist: Brooke Estimable.Rourk, MD  Date:  01/02/2022  ID:  Brooke Gutierrez, DOB 1935/11/23, MRN [id number]   Chief Complaint   Chief Complaint  Patient presents with   Follow-up    Pt is doing good. Had fluid taken off at [facility] yesterday.    History of Present Illness  Brooke Gutierrez is a 86 y.o. female with a history of cirrhosis, thrombocytopenia, esophageal varices, Hodgkin's disease with mixed cellularity (stage IIIb diagnosed in 2008), type 2 diabetes, HLD, COPD presenting today for follow-up of cirrhosis and ascites.   EGD 2016 via [facility].  EGD and colonoscopy with esophageal/gastric varices, gastritis.  Operative notes were not available.  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 Laural Golden in 2012.    Normal echocardiogram in December 2022.   Initial hospitalization 7/13 - 7/15 with ascites, peripheral edema, shortness of breath.  She was seen by our service during this time.  Patient and daughter reported progressive symptoms for 1 month prior to admission and had noted to prior admissions this year for dyspnea and possible acute on chronic congestive heart failure with lower extremity edema.  He had also had recent admission at [facility] 7/3 - 7/4 for bilateral lower extremity edema that was treated with Lasix and electrolyte correction.  She was previously on Lasix but her dose was decreased due to severe hypokalemia and hypomagnesemia.  She was discharged home with instructions to take every other day as well as oral potassium.  She was placed on spironolactone 12.5 mg daily at that time.  She developed rapid fluid retention with a 30 pound weight gain a little over a week after discharge and again presented with hypokalemia and hypomagnesemia.  She again was treated with IV Lasix  and will try collection underwent ultrasound paracentesis which was negative for SBP.  She was discharged home on Lasix 20 mg 3 times weekly and Aldactone was increased to 25 mg daily with frequent electrolyte checks.  Abdominal ultrasound with Doppler also ordered.   Seen in clinic 09/25/2021 with concern for increased abdominal girth and peripheral edema since her last paracentesis.  At this time abdominal girth and swelling was reported to be about the same, only about a 7 pound weight gain since hospitalization.  She reported following a low-sodium diet and taking Lasix every other day with spironolactone 25 mg daily and 10 mEq of potassium daily.  MELD sodium at that time was 18, Child-Pugh C.  No evidence of HD or GI bleeding.  Labs and abdominal ultrasound ordered.  Repeat hepatitis serologies ordered to check for immunity.  Also advised to continue Coreg for bleeding prophylaxis.  She was advised to follow-up in 2 weeks to determine need for paracentesis.   She is found to have no evidence of active hepatitis B or C.  She is immune to hepatitis A but not hepatitis B.   Abdominal ultrasound with liver Doppler performed 10/08/2021 with evidence of cirrhosis, patent portal system and moderate ascites with mild splenomegaly without any focal liver lesion.   Prior colonoscopy records received from [facility] dated 06/21/2013 in which she was noted to have mild diverticulosis in the left colon and internal hemorrhoids.  EGD performed 06/16/2014 noting portal hypertensive gastropathy, 2 columns of large varices in the distal esophagus, hiatal hernia without reported banding however  recommendations stated repeat banding in 2-3 weeks and to continue nadolol.  Subsequent EGD 07/07/2014 with 2 columns of medium sized varices in distal esophagus, mild gastritis in the antrum, and advise repeat EGD in 1 year.   Magnesium levels: 12/05/21 1.1 11/14/21  1.5 11/05/21 1.6 10/22/21 1.7 10/08/21  1.2 09/20/2021         2.1 09/21/2021        1.6 09/30/2021        1.1 10/08/2021          1.2 10/22/2021        1.7 11/05/2021        1.6 11/14/2021          1.5   After review of labs from 10/22/2021 she was recommended increase her Lasix 20 mg every other day and spironolactone to 50 mg daily continue current dose of magnesium 3 times daily with plan to repeat in 1 week.   ED visit 10/31/2021 due to not feeling well, shortness of breath, swelling.  She has stable labs, normal kidney function, electrolytes were within normal limits.  Negative chest x-ray.  Paracentesis performed with 4.5 L of fluid removal.  She is recommended to have magnesium rechecked prior to office visit.   Today:  Hospice CNA has been coming and helping her get a bath. Social worker is helping as well to get her signed up on meals on wheels to help with that.   Today she had grilled chicken and broccoli for lunch and had egg, yogurt and protein shake. Appetite is good after procedures are down. Denies any mental status changes, jaundice, melena, BRBPR.   Hospice nurse asked again about pleur-x catheter peritoneal. The nurses meet with doctors weekly. Has oxygen at home as well to help.   Has 2 more additional procedure scheduled right now. Has a bladder infection right now and is on Macrobid right now.   Weight ranging from 130-147 since her last visit.  138 lbs today.   Reported that she recently had some fluid on her left ear, has been taking Claritin as recommended by another provider and has noted some improvement.  Reported today that hospice CNA recognized some redness to her lower extremities a few days ago.  She has been putting hydrocortisone on her legs which have soothed them from itching.  Area was outlined and there is evidence of improvement.  Possibly related to being on Macrobid.  Current Outpatient Medications  Medication Sig Dispense Refill   ferrous sulfate 325 (65 FE) MG tablet Take 325 mg by mouth daily with breakfast.      furosemide (LASIX) 20 MG tablet Take 1 tablet (20 mg total) by mouth daily. 30 tablet 1   insulin glargine (LANTUS) 100 UNIT/ML injection Inject 5-15 Units into the skin at bedtime. Sliding scale     insulin lispro (HUMALOG) 100 UNIT/ML injection Inject 10 Units into the skin 3 (three) times daily with meals. Sliding scale     Magnesium Oxide 400 MG CAPS Take 1 capsule (400 mg total) by mouth in the morning and at bedtime. 60 capsule 1   potassium chloride (KLOR-CON) 10 MEQ tablet Take 1 tablet (10 mEq total) by mouth daily. Take While taking Lasix/furosemide 30 tablet 2   pravastatin (PRAVACHOL) 20 MG tablet Take 20 mg by mouth every evening.     spironolactone (ALDACTONE) 25 MG tablet Take 3 tablets (75 mg total) by mouth daily. 60 tablet 1   No current facility-administered medications for this visit.  Past Medical History:  Diagnosis Date   CHF (congestive heart failure) ([place])    ?normal ECHO 02/2021   Cirrhosis ([place])    COPD (chronic obstructive pulmonary disease) ([facility])    Diabetes mellitus without complication ([facility])    HLD (hyperlipidemia)     Past Surgical History:  Procedure Laterality Date   ABDOMINAL HYSTERECTOMY     APPENDECTOMY     BLADDER REPAIR     CESAREAN SECTION     COLONOSCOPY     ESOPHAGOGASTRODUODENOSCOPY     GALLBLADDER SURGERY      No family history on file.  Allergies as of 01/02/2022 - Review Complete 01/02/2022  Allergen Reaction Noted   Ciprofloxacin  09/30/2018   Levaquin [levofloxacin]  09/30/2018   Sulfa antibiotics  09/30/2018   Other Rash 08/03/2019    Social History   Socioeconomic History   Marital status: Married    Spouse name: Not on file   Number of children: Not on file   Years of education: Not on file   Highest education level: Not on file  Occupational History   Not on file  Tobacco Use   Smoking status: Never   Smokeless tobacco: Never  Vaping Use   Vaping Use: Never used  Substance and Sexual Activity   Alcohol  use: Never   Drug use: Never   Sexual activity: Not Currently  Other Topics Concern   Not on file  Social History Narrative   Not on file   Social Determinants of Health   Financial Resource Strain: Not on file  Food Insecurity: Not on file  Transportation Needs: Not on file  Physical Activity: Not on file  Stress: Not on file  Social Connections: Not on file     Review of Systems   Gen: Denies fever, chills, anorexia. Denies fatigue, weakness, weight loss.  CV: Denies chest pain, palpitations, syncope, peripheral edema, and claudication. Resp: Denies dyspnea at rest, cough, wheezing, coughing up blood, and pleurisy. GI: See HPI Derm: Denies rash, itching, dry skin Psych: Denies depression, anxiety, memory loss, confusion. No homicidal or suicidal ideation.  Heme: Denies bruising, bleeding, and enlarged lymph nodes.   Physical Exam   BP (!) 96/57 (BP Location: Left Arm, Patient Position: Sitting, Cuff Size: Normal)   Pulse 100   Temp (!) 97.5 F (36.4 C) (Temporal)   Ht '5\' 3"'$  (1.6 m)   Wt 138 lb (62.6 kg)   SpO2 96%   BMI 24.45 kg/m   General:   Alert and oriented. No distress noted. Pleasant and cooperative.  Head:  Normocephalic and atraumatic. Eyes:  Conjuctiva clear without scleral icterus. Mouth:  Oral mucosa pink and moist. Good dentition. No lesions. Lungs:  Clear to auscultation bilaterally. No wheezes, rales, or rhonchi. No distress.  Heart:  S1, S2 present without murmurs appreciated.  Abdomen:  +BS, soft, non-tender, mild distention. No rebound or guarding. No HSM or masses noted. Rectal: deferred Msk:  Symmetrical without gross deformities. Normal posture. Extremities:  +3 pitting edema bilaterally, mild erythema bilateral lower extremities.  Neurologic:  Alert and  oriented x4 Psych:  Alert and cooperative. Normal mood and affect.   Assessment  Brooke Gutierrez is a 86 y.o. female with a history of cirrhosis, thrombocytopenia, esophageal varices,  Hodgkin's disease with mixed cellularity (stage IIIb diagnosed in 2008), type 2 diabetes, HLD, and COPD presenting today for follow-up of chronic issues.  NASH Cirrhosis with ascites/lower extremity edema: Previous MELD 21, Child-Pugh C.  Diagnosed about 12 years  ago.  Prior work-up negative for viral hepatitis.  She has immunity to hepatitis A and was vaccinated for hepatitis B in 2014.  Her course of been complicated by thrombocytopenia, elevated INR, esophageal and gastric varices with banding in 2016 and recurrent ascites refractory to diuretics requiring weekly paracentesis.  Diuretics have not been optimized due to electrolyte abnormalities.  She is on multiple hospitalizations this year due to shortness of breath and significant ascites.  She currently is having 4 L of ascites removed at each paracentesis and is receiving albumin.  No history of SBP.  Her weight has been ranging 130 to 147 pounds.  Current regimen is Lasix 20 mg daily and spironolactone 75 mg daily but she has continued to have hypomagnesemia as stated below.  She has not had any recent hypokalemia.  Continues to have no signs of hepatic encephalopathy.  It appears that she is currently following dietary recommendations and family is now receiving help with hospice and social work to provide support to patient and her husband.  She continues to drink a Glucerna every day.  Denies any lack of appetite and has noticed some improvement in her fatigue recently.  She was previously on Coreg for variceal prophylaxis but given hypotension she has not tolerated it.  Plan is to continue as needed weekly paracentesis, if less than 4 L removed at next paracentesis, we may work toward spacing out to every 2 weeks.  Given patient's mobility and current status, she is now under the care of hospice to have extra assistance at home.  Patient and daughter would like to consider permanent Pleurx peritoneal catheter placement for drainage of ascites at home  given difficulty getting to appointments and patient's decreased mobility.  I discussed with patient and family today about the increased risk of having this performed and that they should reach out to hospice physician to order IR consult to discuss placement with them.  Hypomagnesemia: Likely in the setting of diuretic use.  Last magnesium 12/05/2021 was 1.1.  Patient changed from slow mag to magnesium oxide.  Was to have follow-up magnesium and BMP in 1 week but has not been drawn at her last couple paracenteses.  Advised daughter and patient today to have labs checked at paracentesis next week.  Stasis dermatitis: In the setting of chronic lower extremity edema.  Legs marked with skin marker by hospice nurse.  She continues to have mild erythema to her bilateral lower extremities with +3 pitting edema bilaterally.  Does have some occasional discomfort and pruritus.  Topical hydrocortisone has been soothing her legs and appears that there has been improvement in erythema.  Continue current regimen for now.  PLAN    BMP and Magnesium next week Continue daily Glucerna Continue as needed paracentesis with goal to space out procedures to 2 weeks and continue to work toward diuretic optimization. Continue hydrocortisone as needed to bilateral lower extremities for stasis dermatitis Continue Lasix 20 mg daily Continue spironolactone 75 mg daily Continue high-protein, low-fat, low-sodium diet Limit Tylenol to no more than 2000 mg/day Compression therapy as needed Continue to monitor weights Follow up in 6 weeks.     Brooke Night, MSN, FNP-BC, AGACNP-BC [facility]

## 2022-01-03 DIAGNOSIS — R339 Retention of urine, unspecified: Secondary | ICD-10-CM | POA: Diagnosis not present

## 2022-01-03 LAB — CYTOLOGY - NON PAP

## 2022-01-06 ENCOUNTER — Emergency Department (HOSPITAL_COMMUNITY): Payer: Medicare Other

## 2022-01-06 ENCOUNTER — Other Ambulatory Visit: Payer: Self-pay

## 2022-01-06 ENCOUNTER — Inpatient Hospital Stay (HOSPITAL_COMMUNITY)
Admission: EM | Admit: 2022-01-06 | Discharge: 2022-02-07 | DRG: 682 | Disposition: E | Payer: Medicare Other | Attending: Internal Medicine | Admitting: Internal Medicine

## 2022-01-06 ENCOUNTER — Encounter (HOSPITAL_COMMUNITY): Payer: Self-pay | Admitting: Emergency Medicine

## 2022-01-06 DIAGNOSIS — J9811 Atelectasis: Secondary | ICD-10-CM | POA: Diagnosis not present

## 2022-01-06 DIAGNOSIS — N179 Acute kidney failure, unspecified: Secondary | ICD-10-CM | POA: Diagnosis not present

## 2022-01-06 DIAGNOSIS — Z66 Do not resuscitate: Secondary | ICD-10-CM | POA: Diagnosis not present

## 2022-01-06 DIAGNOSIS — N3000 Acute cystitis without hematuria: Secondary | ICD-10-CM

## 2022-01-06 DIAGNOSIS — K7682 Hepatic encephalopathy: Secondary | ICD-10-CM | POA: Diagnosis not present

## 2022-01-06 DIAGNOSIS — R188 Other ascites: Secondary | ICD-10-CM | POA: Diagnosis present

## 2022-01-06 DIAGNOSIS — Z515 Encounter for palliative care: Secondary | ICD-10-CM

## 2022-01-06 DIAGNOSIS — G9341 Metabolic encephalopathy: Secondary | ICD-10-CM | POA: Diagnosis present

## 2022-01-06 DIAGNOSIS — E8809 Other disorders of plasma-protein metabolism, not elsewhere classified: Secondary | ICD-10-CM | POA: Diagnosis present

## 2022-01-06 DIAGNOSIS — B9689 Other specified bacterial agents as the cause of diseases classified elsewhere: Secondary | ICD-10-CM | POA: Diagnosis present

## 2022-01-06 DIAGNOSIS — N390 Urinary tract infection, site not specified: Secondary | ICD-10-CM | POA: Diagnosis present

## 2022-01-06 DIAGNOSIS — Z79899 Other long term (current) drug therapy: Secondary | ICD-10-CM

## 2022-01-06 DIAGNOSIS — E872 Acidosis, unspecified: Secondary | ICD-10-CM | POA: Diagnosis present

## 2022-01-06 DIAGNOSIS — R4182 Altered mental status, unspecified: Secondary | ICD-10-CM | POA: Diagnosis not present

## 2022-01-06 DIAGNOSIS — J449 Chronic obstructive pulmonary disease, unspecified: Secondary | ICD-10-CM | POA: Diagnosis present

## 2022-01-06 DIAGNOSIS — Z881 Allergy status to other antibiotic agents status: Secondary | ICD-10-CM

## 2022-01-06 DIAGNOSIS — J9621 Acute and chronic respiratory failure with hypoxia: Secondary | ICD-10-CM | POA: Diagnosis present

## 2022-01-06 DIAGNOSIS — R54 Age-related physical debility: Secondary | ICD-10-CM | POA: Diagnosis present

## 2022-01-06 DIAGNOSIS — K7581 Nonalcoholic steatohepatitis (NASH): Secondary | ICD-10-CM | POA: Diagnosis present

## 2022-01-06 DIAGNOSIS — Z882 Allergy status to sulfonamides status: Secondary | ICD-10-CM

## 2022-01-06 DIAGNOSIS — I5032 Chronic diastolic (congestive) heart failure: Secondary | ICD-10-CM | POA: Diagnosis present

## 2022-01-06 DIAGNOSIS — R000 Tachycardia, unspecified: Secondary | ICD-10-CM | POA: Diagnosis not present

## 2022-01-06 DIAGNOSIS — E785 Hyperlipidemia, unspecified: Secondary | ICD-10-CM | POA: Diagnosis present

## 2022-01-06 DIAGNOSIS — Z6824 Body mass index (BMI) 24.0-24.9, adult: Secondary | ICD-10-CM

## 2022-01-06 DIAGNOSIS — E119 Type 2 diabetes mellitus without complications: Secondary | ICD-10-CM | POA: Diagnosis present

## 2022-01-06 DIAGNOSIS — I959 Hypotension, unspecified: Secondary | ICD-10-CM | POA: Diagnosis not present

## 2022-01-06 DIAGNOSIS — J9601 Acute respiratory failure with hypoxia: Secondary | ICD-10-CM | POA: Diagnosis present

## 2022-01-06 DIAGNOSIS — E7220 Disorder of urea cycle metabolism, unspecified: Secondary | ICD-10-CM

## 2022-01-06 DIAGNOSIS — K7290 Hepatic failure, unspecified without coma: Secondary | ICD-10-CM | POA: Diagnosis not present

## 2022-01-06 DIAGNOSIS — E871 Hypo-osmolality and hyponatremia: Secondary | ICD-10-CM | POA: Diagnosis present

## 2022-01-06 DIAGNOSIS — Z794 Long term (current) use of insulin: Secondary | ICD-10-CM

## 2022-01-06 DIAGNOSIS — K7460 Unspecified cirrhosis of liver: Secondary | ICD-10-CM | POA: Diagnosis present

## 2022-01-06 LAB — CULTURE, BODY FLUID W GRAM STAIN -BOTTLE: Culture: NO GROWTH

## 2022-01-06 LAB — BLOOD GAS, VENOUS
Acid-Base Excess: 2.1 mmol/L — ABNORMAL HIGH (ref 0.0–2.0)
Bicarbonate: 25.3 mmol/L (ref 20.0–28.0)
Drawn by: 66582
O2 Saturation: 80.1 %
Patient temperature: 36.9
pCO2, Ven: 34 mmHg — ABNORMAL LOW (ref 44–60)
pH, Ven: 7.48 — ABNORMAL HIGH (ref 7.25–7.43)
pO2, Ven: 49 mmHg — ABNORMAL HIGH (ref 32–45)

## 2022-01-06 NOTE — ED Triage Notes (Signed)
Pt has been altered since this evening family not sure when pt was last normal. Pt is a hospice pt.

## 2022-01-06 NOTE — ED Provider Notes (Signed)
[facility] EMERGENCY DEPARTMENT Provider Note   CSN: [id number] Arrival date & time: 01/05/2022  2249     History  Chief Complaint  Patient presents with   Altered Mental Status    Brooke Gutierrez is a 86 y.o. female.  Patient is an 86 year old female with past medical history of cirrhosis, diabetes, COPD, CHF, hyperlipidemia.  Patient sent from home by EMS for evaluation of altered mental status.  From what I am told, patient is on hospice, but is a "full code".  Patient adds little additional history.  She reports back pain, however this appears to be a chronic issue for her.  According to the patient's husband, she has been confused and disoriented this evening.  The history is provided by the patient.  Altered Mental Status Presenting symptoms: behavior changes and confusion   Severity:  Moderate Most recent episode:  Today Timing:  Constant Progression:  Worsening Chronicity:  New      Home Medications Prior to Admission medications   Medication Sig Start Date End Date Taking? Authorizing Provider  ferrous sulfate 325 (65 FE) MG tablet Take 325 mg by mouth daily with breakfast.    [provider]  furosemide (LASIX) 20 MG tablet Take 1 tablet (20 mg total) by mouth daily. 10/24/21   Erenest Rasher, PA-C  insulin glargine (LANTUS) 100 UNIT/ML injection Inject 5-15 Units into the skin at bedtime. Sliding scale    [provider]  insulin lispro (HUMALOG) 100 UNIT/ML injection Inject 10 Units into the skin 3 (three) times daily with meals. Sliding scale    [provider]  Magnesium Oxide 400 MG CAPS Take 1 capsule (400 mg total) by mouth in the morning and at bedtime. 12/09/21   Sherron Monday, NP  potassium chloride (KLOR-CON) 10 MEQ tablet Take 1 tablet (10 mEq total) by mouth daily. Take While taking Lasix/furosemide 03/22/21   Roxan Hockey, MD  pravastatin (PRAVACHOL) 20 MG tablet Take 20 mg by mouth every evening.    [provider]  spironolactone (ALDACTONE) 25 MG tablet Take 3 tablets (75 mg total) by mouth daily. 11/26/21 11/26/22  Sherron Monday, NP      Allergies    Ciprofloxacin, Levaquin [levofloxacin], Sulfa antibiotics, and Other    Review of Systems   Review of Systems  Psychiatric/Behavioral:  Positive for confusion.   All other systems reviewed and are negative.   Physical Exam Updated Vital Signs BP (!) 139/59   Pulse (!) 110   Temp 98.4 F (36.9 C) (Rectal)   Resp 15   Ht '5\' 3"'$  (1.6 m)   Wt 63 kg   SpO2 98%   BMI 24.60 kg/m  Physical Exam Vitals and nursing note reviewed.  Constitutional:      General: She is not in acute distress.    Appearance: She is well-developed. She is not diaphoretic.  HENT:     Head: Normocephalic and atraumatic.  Cardiovascular:     Rate and Rhythm: Normal rate and regular rhythm.     Heart sounds: No murmur heard.    No friction rub. No gallop.  Pulmonary:     Effort: Pulmonary effort is normal. No respiratory distress.     Breath sounds: Normal breath sounds. No wheezing.  Abdominal:     General: Bowel sounds are normal. There is distension.     Palpations: Abdomen is soft.     Tenderness: There is no abdominal tenderness.     Comments: Abdomen is  distended with fluid wave noted.  Musculoskeletal:        General: Normal range of motion.     Cervical back: Normal range of motion and neck supple.  Skin:    General: Skin is warm and dry.  Neurological:     General: No focal deficit present.     Mental Status: She is alert and oriented to person, place, and time.     ED Results / Procedures / Treatments   Labs (all labs ordered are listed, but only abnormal results are displayed) Labs Reviewed  CULTURE, BLOOD (ROUTINE X 2)  CULTURE, BLOOD (ROUTINE X 2)  URINE CULTURE  LACTIC ACID, PLASMA  LACTIC ACID, PLASMA  COMPREHENSIVE METABOLIC PANEL  CBC WITH DIFFERENTIAL/PLATELET  PROTIME-INR  URINALYSIS, ROUTINE W REFLEX MICROSCOPIC   BRAIN NATRIURETIC PEPTIDE  BLOOD GAS, VENOUS  AMMONIA  I-STAT CHEM 8, ED    EKG EKG Interpretation  Date/Time:  Monday January 06 2022 22:59:21 EDT Ventricular Rate:  108 PR Interval:  161 QRS Duration: 90 QT Interval:  356 QTC Calculation: 478 R Axis:   -25 Text Interpretation: Sinus tachycardia Borderline left axis deviation Low voltage, extremity and precordial leads Consider anterior infarct No significant change since 10/31/2021 Confirmed by Veryl Speak [phone]) on 12/14/2021 11:08:15 PM  Radiology No results found.  Procedures Procedures  {Document cardiac monitor, telemetry assessment procedure when appropriate:1}  Medications Ordered in ED Medications - No data to display  ED Course/ Medical Decision Making/ A&P                           Medical Decision Making  ***  {Document critical care time when appropriate:1} {Document review of labs and clinical decision tools ie heart score, Chads2Vasc2 etc:1}  {Document your independent review of radiology images, and any outside records:1} {Document your discussion with family members, caretakers, and with consultants:1} {Document social determinants of health affecting pt's care:1} {Document your decision making why or why not admission, treatments were needed:1} Final Clinical Impression(s) / ED Diagnoses Final diagnoses:  None    Rx / DC Orders ED Discharge Orders     None

## 2022-01-07 ENCOUNTER — Encounter (HOSPITAL_COMMUNITY): Payer: Self-pay | Admitting: Family Medicine

## 2022-01-07 ENCOUNTER — Inpatient Hospital Stay (HOSPITAL_COMMUNITY): Payer: Medicare Other

## 2022-01-07 DIAGNOSIS — K7460 Unspecified cirrhosis of liver: Secondary | ICD-10-CM

## 2022-01-07 DIAGNOSIS — N3000 Acute cystitis without hematuria: Secondary | ICD-10-CM

## 2022-01-07 DIAGNOSIS — E119 Type 2 diabetes mellitus without complications: Secondary | ICD-10-CM | POA: Diagnosis present

## 2022-01-07 DIAGNOSIS — K7581 Nonalcoholic steatohepatitis (NASH): Secondary | ICD-10-CM

## 2022-01-07 DIAGNOSIS — E7220 Disorder of urea cycle metabolism, unspecified: Secondary | ICD-10-CM | POA: Diagnosis not present

## 2022-01-07 DIAGNOSIS — E785 Hyperlipidemia, unspecified: Secondary | ICD-10-CM | POA: Diagnosis present

## 2022-01-07 DIAGNOSIS — R188 Other ascites: Secondary | ICD-10-CM | POA: Diagnosis not present

## 2022-01-07 DIAGNOSIS — E871 Hypo-osmolality and hyponatremia: Secondary | ICD-10-CM | POA: Diagnosis present

## 2022-01-07 DIAGNOSIS — Z515 Encounter for palliative care: Secondary | ICD-10-CM | POA: Diagnosis not present

## 2022-01-07 DIAGNOSIS — Z7189 Other specified counseling: Secondary | ICD-10-CM | POA: Diagnosis not present

## 2022-01-07 DIAGNOSIS — N179 Acute kidney failure, unspecified: Secondary | ICD-10-CM

## 2022-01-07 DIAGNOSIS — B9689 Other specified bacterial agents as the cause of diseases classified elsewhere: Secondary | ICD-10-CM | POA: Diagnosis present

## 2022-01-07 DIAGNOSIS — K7682 Hepatic encephalopathy: Secondary | ICD-10-CM | POA: Diagnosis not present

## 2022-01-07 DIAGNOSIS — R4182 Altered mental status, unspecified: Secondary | ICD-10-CM | POA: Diagnosis present

## 2022-01-07 DIAGNOSIS — J9601 Acute respiratory failure with hypoxia: Secondary | ICD-10-CM | POA: Diagnosis not present

## 2022-01-07 DIAGNOSIS — N390 Urinary tract infection, site not specified: Secondary | ICD-10-CM | POA: Diagnosis present

## 2022-01-07 DIAGNOSIS — E8809 Other disorders of plasma-protein metabolism, not elsewhere classified: Secondary | ICD-10-CM | POA: Diagnosis present

## 2022-01-07 DIAGNOSIS — R54 Age-related physical debility: Secondary | ICD-10-CM | POA: Diagnosis present

## 2022-01-07 DIAGNOSIS — Z794 Long term (current) use of insulin: Secondary | ICD-10-CM | POA: Diagnosis not present

## 2022-01-07 DIAGNOSIS — G9341 Metabolic encephalopathy: Secondary | ICD-10-CM | POA: Diagnosis not present

## 2022-01-07 DIAGNOSIS — E872 Acidosis, unspecified: Secondary | ICD-10-CM | POA: Diagnosis present

## 2022-01-07 DIAGNOSIS — Z881 Allergy status to other antibiotic agents status: Secondary | ICD-10-CM | POA: Diagnosis not present

## 2022-01-07 DIAGNOSIS — J9621 Acute and chronic respiratory failure with hypoxia: Secondary | ICD-10-CM | POA: Diagnosis present

## 2022-01-07 DIAGNOSIS — J449 Chronic obstructive pulmonary disease, unspecified: Secondary | ICD-10-CM | POA: Diagnosis present

## 2022-01-07 DIAGNOSIS — I5032 Chronic diastolic (congestive) heart failure: Secondary | ICD-10-CM | POA: Diagnosis present

## 2022-01-07 DIAGNOSIS — I959 Hypotension, unspecified: Secondary | ICD-10-CM | POA: Diagnosis not present

## 2022-01-07 DIAGNOSIS — Z79899 Other long term (current) drug therapy: Secondary | ICD-10-CM | POA: Diagnosis not present

## 2022-01-07 DIAGNOSIS — Z66 Do not resuscitate: Secondary | ICD-10-CM | POA: Diagnosis not present

## 2022-01-07 LAB — URINALYSIS, ROUTINE W REFLEX MICROSCOPIC
Bilirubin Urine: NEGATIVE
Glucose, UA: NEGATIVE mg/dL
Hgb urine dipstick: NEGATIVE
Ketones, ur: NEGATIVE mg/dL
Nitrite: NEGATIVE
Protein, ur: >=300 mg/dL — AB
Specific Gravity, Urine: 1.017 (ref 1.005–1.030)
pH: 8 (ref 5.0–8.0)

## 2022-01-07 LAB — COMPREHENSIVE METABOLIC PANEL
ALT: 15 U/L (ref 0–44)
ALT: 17 U/L (ref 0–44)
AST: 32 U/L (ref 15–41)
AST: 33 U/L (ref 15–41)
Albumin: 2.6 g/dL — ABNORMAL LOW (ref 3.5–5.0)
Albumin: 2.6 g/dL — ABNORMAL LOW (ref 3.5–5.0)
Alkaline Phosphatase: 117 U/L (ref 38–126)
Alkaline Phosphatase: 128 U/L — ABNORMAL HIGH (ref 38–126)
Anion gap: 12 (ref 5–15)
Anion gap: 12 (ref 5–15)
BUN: 34 mg/dL — ABNORMAL HIGH (ref 8–23)
BUN: 37 mg/dL — ABNORMAL HIGH (ref 8–23)
CO2: 23 mmol/L (ref 22–32)
CO2: 23 mmol/L (ref 22–32)
Calcium: 8.8 mg/dL — ABNORMAL LOW (ref 8.9–10.3)
Calcium: 8.8 mg/dL — ABNORMAL LOW (ref 8.9–10.3)
Chloride: 96 mmol/L — ABNORMAL LOW (ref 98–111)
Chloride: 96 mmol/L — ABNORMAL LOW (ref 98–111)
Creatinine, Ser: 1.34 mg/dL — ABNORMAL HIGH (ref 0.44–1.00)
Creatinine, Ser: 1.38 mg/dL — ABNORMAL HIGH (ref 0.44–1.00)
GFR, Estimated: 38 mL/min — ABNORMAL LOW (ref >60)
GFR, Estimated: 39 mL/min — ABNORMAL LOW (ref >60)
Glucose, Bld: 132 mg/dL — ABNORMAL HIGH (ref 70–99)
Glucose, Bld: 144 mg/dL — ABNORMAL HIGH (ref 70–99)
Potassium: 3.6 mmol/L (ref 3.5–5.1)
Potassium: 3.9 mmol/L (ref 3.5–5.1)
Sodium: 131 mmol/L — ABNORMAL LOW (ref 135–145)
Sodium: 131 mmol/L — ABNORMAL LOW (ref 135–145)
Total Bilirubin: 6.3 mg/dL — ABNORMAL HIGH (ref 0.3–1.2)
Total Bilirubin: 6.5 mg/dL — ABNORMAL HIGH (ref 0.3–1.2)
Total Protein: 5.7 g/dL — ABNORMAL LOW (ref 6.5–8.1)
Total Protein: 6 g/dL — ABNORMAL LOW (ref 6.5–8.1)

## 2022-01-07 LAB — LACTIC ACID, PLASMA
Lactic Acid, Venous: 2.7 mmol/L (ref 0.5–1.9)
Lactic Acid, Venous: 3.8 mmol/L (ref 0.5–1.9)
Lactic Acid, Venous: 4 mmol/L (ref 0.5–1.9)

## 2022-01-07 LAB — CBC WITH DIFFERENTIAL/PLATELET
Abs Immature Granulocytes: 0.04 10*3/uL (ref 0.00–0.07)
Abs Immature Granulocytes: 0.05 10*3/uL (ref 0.00–0.07)
Basophils Absolute: 0 10*3/uL (ref 0.0–0.1)
Basophils Absolute: 0 10*3/uL (ref 0.0–0.1)
Basophils Relative: 1 %
Basophils Relative: 1 %
Eosinophils Absolute: 0 10*3/uL (ref 0.0–0.5)
Eosinophils Absolute: 0 10*3/uL (ref 0.0–0.5)
Eosinophils Relative: 0 %
Eosinophils Relative: 1 %
HCT: 29 % — ABNORMAL LOW (ref 36.0–46.0)
HCT: 29.9 % — ABNORMAL LOW (ref 36.0–46.0)
Hemoglobin: 10.3 g/dL — ABNORMAL LOW (ref 12.0–15.0)
Hemoglobin: 9.8 g/dL — ABNORMAL LOW (ref 12.0–15.0)
Immature Granulocytes: 1 %
Immature Granulocytes: 1 %
Lymphocytes Relative: 11 %
Lymphocytes Relative: 14 %
Lymphs Abs: 0.7 10*3/uL (ref 0.7–4.0)
Lymphs Abs: 0.9 10*3/uL (ref 0.7–4.0)
MCH: 33.9 pg (ref 26.0–34.0)
MCH: 34.1 pg — ABNORMAL HIGH (ref 26.0–34.0)
MCHC: 33.8 g/dL (ref 30.0–36.0)
MCHC: 34.4 g/dL (ref 30.0–36.0)
MCV: 101 fL — ABNORMAL HIGH (ref 80.0–100.0)
MCV: 98.4 fL (ref 80.0–100.0)
Monocytes Absolute: 0.4 10*3/uL (ref 0.1–1.0)
Monocytes Absolute: 0.4 10*3/uL (ref 0.1–1.0)
Monocytes Relative: 6 %
Monocytes Relative: 6 %
Neutro Abs: 5 10*3/uL (ref 1.7–7.7)
Neutro Abs: 5 10*3/uL (ref 1.7–7.7)
Neutrophils Relative %: 77 %
Neutrophils Relative %: 81 %
Platelets: 90 10*3/uL — ABNORMAL LOW (ref 150–400)
Platelets: 97 10*3/uL — ABNORMAL LOW (ref 150–400)
RBC: 2.87 MIL/uL — ABNORMAL LOW (ref 3.87–5.11)
RBC: 3.04 MIL/uL — ABNORMAL LOW (ref 3.87–5.11)
RDW: 17.8 % — ABNORMAL HIGH (ref 11.5–15.5)
RDW: 17.9 % — ABNORMAL HIGH (ref 11.5–15.5)
WBC: 6.1 10*3/uL (ref 4.0–10.5)
WBC: 6.4 10*3/uL (ref 4.0–10.5)
nRBC: 0 % (ref 0.0–0.2)
nRBC: 0 % (ref 0.0–0.2)

## 2022-01-07 LAB — MRSA NEXT GEN BY PCR, NASAL: MRSA by PCR Next Gen: NOT DETECTED

## 2022-01-07 LAB — GLUCOSE, CAPILLARY
Glucose-Capillary: 148 mg/dL — ABNORMAL HIGH (ref 70–99)
Glucose-Capillary: 141 mg/dL — ABNORMAL HIGH (ref 70–99)
Glucose-Capillary: 133 mg/dL — ABNORMAL HIGH (ref 70–99)

## 2022-01-07 LAB — PROTIME-INR
INR: 1.5 — ABNORMAL HIGH (ref 0.8–1.2)
Prothrombin Time: 17.8 seconds — ABNORMAL HIGH (ref 11.4–15.2)

## 2022-01-07 LAB — BRAIN NATRIURETIC PEPTIDE: B Natriuretic Peptide: 54 pg/mL (ref 0.0–100.0)

## 2022-01-07 LAB — MAGNESIUM: Magnesium: 1.4 mg/dL — ABNORMAL LOW (ref 1.7–2.4)

## 2022-01-07 LAB — AMMONIA
Ammonia: 136 umol/L — ABNORMAL HIGH (ref 9–35)
Ammonia: 139 umol/L — ABNORMAL HIGH (ref 9–35)

## 2022-01-07 LAB — TSH: TSH: 2.397 u[IU]/mL (ref 0.350–4.500)

## 2022-01-07 MED ORDER — INSULIN ASPART 100 UNIT/ML IJ SOLN: 0.0000 [IU] | Freq: Every day | INTRAMUSCULAR | Status: DC

## 2022-01-07 MED ORDER — INSULIN ASPART 100 UNIT/ML IJ SOLN
0.0000 [IU] | Freq: Three times a day (TID) | INTRAMUSCULAR | Status: DC
  Administered 2022-01-07 (×2): 1 [IU] via SUBCUTANEOUS

## 2022-01-07 MED ORDER — PRAVASTATIN SODIUM 10 MG PO TABS
20.0000 mg | ORAL_TABLET | Freq: Every evening | ORAL | Status: DC
  Administered 2022-01-07 – 2022-01-09 (×3): 20 mg via ORAL
  Filled 2022-01-07 (×3): qty 2

## 2022-01-07 MED ORDER — ALBUTEROL SULFATE (2.5 MG/3ML) 0.083% IN NEBU: 2.5000 mg | INHALATION_SOLUTION | RESPIRATORY_TRACT | Status: DC | PRN

## 2022-01-07 MED ORDER — INSULIN ASPART 100 UNIT/ML IJ SOLN: 0.0000 [IU] | Freq: Three times a day (TID) | INTRAMUSCULAR | Status: DC

## 2022-01-07 MED ORDER — MAGNESIUM SULFATE 2 GM/50ML IV SOLN
2.0000 g | Freq: Once | INTRAVENOUS | Status: AC
  Administered 2022-01-07: 2 g via INTRAVENOUS
  Filled 2022-01-07: qty 50

## 2022-01-07 MED ORDER — ORAL CARE MOUTH RINSE: 15.0000 mL | OROMUCOSAL | Status: DC | PRN

## 2022-01-07 MED ORDER — ONDANSETRON HCL 4 MG PO TABS
4.0000 mg | ORAL_TABLET | Freq: Four times a day (QID) | ORAL | Status: DC | PRN
  Administered 2022-01-09: 4 mg via ORAL
  Filled 2022-01-07: qty 1

## 2022-01-07 MED ORDER — ONDANSETRON HCL 4 MG/2ML IJ SOLN
4.0000 mg | Freq: Four times a day (QID) | INTRAMUSCULAR | Status: DC | PRN
  Administered 2022-01-08: 4 mg via INTRAVENOUS
  Filled 2022-01-07: qty 2

## 2022-01-07 MED ORDER — LACTULOSE 10 GM/15ML PO SOLN
30.0000 g | Freq: Every day | ORAL | Status: DC
  Administered 2022-01-07: 30 g via ORAL
  Filled 2022-01-07: qty 60

## 2022-01-07 MED ORDER — OXYCODONE HCL 5 MG PO TABS
5.0000 mg | ORAL_TABLET | Freq: Four times a day (QID) | ORAL | Status: DC | PRN
  Administered 2022-01-07 – 2022-01-09 (×2): 5 mg via ORAL
  Filled 2022-01-07 (×4): qty 1

## 2022-01-07 MED ORDER — SODIUM CHLORIDE 0.9 % IV SOLN
1.0000 g | Freq: Two times a day (BID) | INTRAVENOUS | Status: DC
  Administered 2022-01-07 – 2022-01-10 (×7): 1 g via INTRAVENOUS
  Filled 2022-01-07 (×7): qty 20

## 2022-01-07 MED ORDER — CHLORHEXIDINE GLUCONATE CLOTH 2 % EX PADS
6.0000 | MEDICATED_PAD | Freq: Every day | CUTANEOUS | Status: DC
  Administered 2022-01-07 – 2022-01-10 (×4): 6 via TOPICAL

## 2022-01-07 MED ORDER — SODIUM CHLORIDE 0.9 % IV BOLUS
1000.0000 mL | Freq: Once | INTRAVENOUS | Status: AC
  Administered 2022-01-07: 1000 mL via INTRAVENOUS

## 2022-01-07 MED ORDER — ALBUMIN HUMAN 25 % IV SOLN
50.0000 g | Freq: Three times a day (TID) | INTRAVENOUS | Status: AC
  Administered 2022-01-07 (×3): 12.5 g via INTRAVENOUS
  Filled 2022-01-07: qty 200

## 2022-01-07 MED ORDER — SODIUM CHLORIDE 0.9 % IV BOLUS
500.0000 mL | Freq: Once | INTRAVENOUS | Status: AC
  Administered 2022-01-07: 500 mL via INTRAVENOUS

## 2022-01-07 MED ORDER — ALBUMIN HUMAN 25 % IV SOLN
INTRAVENOUS | Status: AC
  Administered 2022-01-07: 50 g via INTRAVENOUS
  Filled 2022-01-07: qty 200

## 2022-01-07 MED ORDER — SODIUM CHLORIDE 0.9 % IV SOLN: INTRAVENOUS | Status: DC

## 2022-01-07 MED ORDER — SPIRONOLACTONE 25 MG PO TABS
75.0000 mg | ORAL_TABLET | Freq: Every day | ORAL | Status: DC
  Administered 2022-01-07: 75 mg via ORAL
  Filled 2022-01-07 (×2): qty 3

## 2022-01-07 MED ORDER — SODIUM CHLORIDE 0.9 % IV SOLN: 1.0000 g | INTRAVENOUS | Status: DC

## 2022-01-07 MED ORDER — ACETAMINOPHEN 650 MG RE SUPP: 650.0000 mg | Freq: Four times a day (QID) | RECTAL | Status: DC | PRN

## 2022-01-07 MED ORDER — SODIUM CHLORIDE 0.9 % IV SOLN
1.0000 g | Freq: Once | INTRAVENOUS | Status: AC
  Administered 2022-01-07: 1 g via INTRAVENOUS
  Filled 2022-01-07: qty 10

## 2022-01-07 MED ORDER — ACETAMINOPHEN 325 MG PO TABS
650.0000 mg | ORAL_TABLET | Freq: Four times a day (QID) | ORAL | Status: DC | PRN
  Administered 2022-01-09: 650 mg via ORAL
  Filled 2022-01-07: qty 2

## 2022-01-07 MED ORDER — SPIRONOLACTONE 25 MG PO TABS
50.0000 mg | ORAL_TABLET | Freq: Every day | ORAL | Status: DC
  Administered 2022-01-08: 50 mg via ORAL
  Filled 2022-01-07: qty 2

## 2022-01-07 MED ORDER — LACTULOSE 10 GM/15ML PO SOLN
30.0000 g | Freq: Three times a day (TID) | ORAL | Status: DC
  Administered 2022-01-07 – 2022-01-09 (×6): 30 g via ORAL
  Filled 2022-01-07 (×8): qty 60

## 2022-01-07 MED ORDER — HEPARIN SODIUM (PORCINE) 5000 UNIT/ML IJ SOLN
5000.0000 [IU] | Freq: Three times a day (TID) | INTRAMUSCULAR | Status: DC
  Administered 2022-01-07 – 2022-01-08 (×3): 5000 [IU] via SUBCUTANEOUS
  Filled 2022-01-07 (×3): qty 1

## 2022-01-07 MED ORDER — OXYCODONE HCL 5 MG PO TABS: 5.0000 mg | ORAL_TABLET | ORAL | Status: DC | PRN

## 2022-01-07 NOTE — Assessment & Plan Note (Signed)
-   UA indicative of UTI - Urine culture and blood culture pending - Patient technically meets SIRS criteria but I think that has more to do with her hepatic encephalopathy than with her UTI - Lactic acid elevated, but has been downtrending - Continue to trend - Continue monitor

## 2022-01-07 NOTE — Progress Notes (Signed)
Ultrasound at bedside currently for paracentesis.

## 2022-01-07 NOTE — ED Notes (Signed)
Notified Dr. Stark Jock in person of patient's critical lactic level of 3.8.

## 2022-01-07 NOTE — Assessment & Plan Note (Signed)
-   Albumin is 2.6 - Likely liver cirrhosis and diet contributing - Encourage nutrient dense food choices when patient is able to tolerate diet - Continue to monitor

## 2022-01-07 NOTE — Assessment & Plan Note (Signed)
-   Likely multifactorial - The biggest contributor is likely pneumonia 60 - UTI likely also contributing - Patient was hypoxic and required 2 L nasal cannula, hypoxia likely also contributing - CT head shows no acute findings - Chest x-ray shows low lung volumes - pH 7.48, PCO2 34 - Urine culture and blood culture pending - Continue to treat UTI with Rocephin, continue treat hyperammonemia with lactulose

## 2022-01-07 NOTE — Progress Notes (Signed)
Patient seen and examined; admitted after midnight secondary to altered mental status.  Patient with underlying history of diastolic heart failure, cirrhosis, COPD, type 2 diabetes and hyperlipidemia; who presented by EMS to the emergency department secondary to altered mental status.  Significant elevation on her ammonia has been appreciated with concern for hepatic encephalopathy.  Work-up has also suggested the presence of UTI and dehydration with lactic acidosis.  Continues to be confused.  Please refer to H&P written by Dr. Clearence Ped for further info/details on admission.  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 Meropenem ordered and contact precaution started. -GI curbside and planning to follow as an outpatient. -paracentesis to be done either today or tomorrow. -Follow clinical response. -Palliative care consulted; as patient is actively follow by hospice, but she is a full code.   Barton Dubois MD [phone]

## 2022-01-07 NOTE — TOC Initial Note (Signed)
Transition of Care [facility]) - Initial/Assessment Note    Patient Details  Name: Brooke Gutierrez MRN: [id number] Date of Birth: Jul 21, 1935  Transition of [facility]) CM/SW Contact:    Salome Arnt, LCSW Phone Number: 01/07/2022, 10:56 AM  Clinical Narrative: Pt admitted with acute metabolic encephalopathy. Assessment completed due to high risk readmission score. Pt's daughter, Letta Median reports pt lives with husband. She is active with [facility]. Pt recently qualified for home O2 and Hospice ordered. Family is hoping pt can return home at d/c. [facility] will continue to follow.                  Expected Discharge Plan: Home w Hospice Care Barriers to Discharge: Continued Medical Work up   Patient Goals and CMS Choice Patient states their goals for this hospitalization and ongoing recovery are:: return home   Choice offered to / list presented to : Adult Children  Expected Discharge Plan and Services Expected Discharge Plan: Home w Hospice Care In-house Referral: Clinical Social Work   Post Acute Care Choice: Resumption of Svcs/PTA Provider Living arrangements for the past 2 months: [place]: [facility]        Prior Living Arrangements/Services Living arrangements for the past 2 months: Single Family Home Lives with:: Spouse Patient language and need for interpreter reviewed:: Yes Do you feel safe going back to the place where you live?: Yes      Need for Family Participation in Patient Care: Yes (Comment) Care giver support system in place?: Yes (comment) Current home services: DME (walker, home O2) Criminal Activity/Legal Involvement Pertinent to Current Situation/Hospitalization: No - Comment as needed  Activities of Daily Living   ADL Screening (condition at time of admission) Patient's cognitive ability adequate to safely complete daily activities?: No Is the patient deaf or have  difficulty hearing?: No Does the patient have difficulty seeing, even when wearing glasses/contacts?: No Does the patient have difficulty concentrating, remembering, or making decisions?: No Patient able to express need for assistance with ADLs?: Yes Does the patient have difficulty dressing or bathing?: Yes Independently performs ADLs?: No Communication: Independent Dressing (OT): Needs assistance Is this a change from baseline?: Pre-admission baseline Grooming: Needs assistance Is this a change from baseline?: Pre-admission baseline Feeding: Independent Bathing: Needs assistance Is this a change from baseline?: Pre-admission baseline Toileting: Needs assistance Is this a change from baseline?: Pre-admission baseline In/Out Bed: Needs assistance Is this a change from baseline?: Pre-admission baseline Walks in Home: Needs assistance Is this a change from baseline?: Pre-admission baseline Does the patient have difficulty walking or climbing stairs?: Yes Weakness of Legs: Both Weakness of Arms/Hands: None  Permission Sought/Granted                  Emotional Assessment   Attitude/Demeanor/Rapport: Unable to Assess   Orientation: : Oriented to Self, Oriented to Place Alcohol / Substance Use: Not Applicable Psych Involvement: No (comment)  Admission diagnosis:  Hepatic encephalopathy ([place]) [K76.82] Acute cystitis without hematuria [P29.51] Acute metabolic encephalopathy [O84.16] Patient Active Problem List   Diagnosis Date Noted   Acute metabolic encephalopathy 60/63/0160   Hyperammonemia ([place]) 01/07/2022   UTI (urinary tract infection) 01/07/2022   AKI (acute kidney injury) ([place]) 01/07/2022   Lower extremity edema 09/20/2021   Hyperbilirubinemia 09/20/2021   Acute  respiratory failure with hypoxia ([place]) 09/20/2021   Ascites 09/19/2021   Acute exacerbation of CHF (congestive heart failure) ([place]) 03/22/2021   Non-Hodgkin lymphoma in remission --2008 03/22/2021   Acute  on chronic heart failure with preserved ejection fraction (HFpEF) ([place]) 03/22/2021   Liver cirrhosis secondary to NASH (nonalcoholic steatohepatitis) ([place]) 03/22/2021   Hypomagnesemia and Hypokalemia-due to Diarrhea and Diuretics 03/22/2021   Elevated brain natriuretic peptide (BNP) level 03/21/2021   Chronic diarrhea 03/21/2021   Vomiting 03/21/2021   Pancytopenia ([place]) 03/21/2021   Hypokalemia/Hypomagnesemia-due to Diarrhea and Diuretics 03/21/2021   Hypoalbuminemia 03/21/2021   Prolonged QT interval 03/21/2021   GERD (gastroesophageal reflux disease) 03/21/2021   Mixed hyperlipidemia 03/21/2021   Bilateral pleural effusion 03/21/2021   Cirrhosis of liver with ascites ([place]) 03/21/2021   Right leg swelling 03/21/2021   Intermediate stage nonexudative age-related macular degeneration of both eyes 08/30/2019   Type 2 diabetes mellitus ([place]) 08/30/2019   Lumbar transverse process fracture ([place]) 05/05/2019   Unilateral primary osteoarthritis, right hip 10/02/2018   PCP:  Neale Burly, MD Pharmacy:   [facility] [address], [place] [address] Phone: [phone] Fax: [place], [place] [address] Phone: [phone] Fax: [phone]     Social Determinants of Health (SDOH) Interventions    Readmission Risk Interventions    01/07/2022   10:51 AM  Readmission Risk Prevention Plan  Transportation Screening Complete  HRI or [place] Complete  Social Work Consult for [place] Planning/Counseling Complete  Medication Review Press photographer) Complete

## 2022-01-07 NOTE — Assessment & Plan Note (Signed)
Continue spironolactone?

## 2022-01-07 NOTE — Assessment & Plan Note (Signed)
-   Creatinine at baseline 0.73 - Creatinine today 1.54 - With little history, it is hard to know and the main contributors - Hold nephrotoxic agents when possible - Trend creatinine in the a.m. - Patient was given 1 L bolus in the ED - Continue monitor

## 2022-01-07 NOTE — H&P (Signed)
History and Physical    Patient: Brooke Gutierrez FGH:[id number] DOB: [birth year] DOA: 01/01/2022 DOS: the patient was seen and examined on 01/07/2022 PCP: Neale Burly, MD  Patient coming from: Home  Chief Complaint:  Chief Complaint  Patient presents with   Altered Mental Status   HPI: Brooke Gutierrez is a 86 y.o. female with medical history significant of CHF, cirrhosis, COPD, diabetes mellitus type 2, hyperlipidemia, presents the ED with a chief complaint of altered mental status.  Patient is not able to provide any history.  EMS reported the patient was sent from home for evaluation of altered mental status.  Patient complained of back pain to the ED physician, but not to me.  Patient's husband had reported the patient was confused and disoriented.  Review patient says she does not know why she is here.  She thinks she might feel better.  She is not currently in any pain.  She is not able to name the hospital, or the year, but she does know her own name.  As 1 would expect with hepatic encephalopathy, patient is very somnolent and falls asleep between most questions.  No further history could be obtained at this time. Review of Systems: unable to review all systems due to the inability of the patient to answer questions. Past Medical History:  Diagnosis Date   CHF (congestive heart failure) ([place])    ?normal ECHO 02/2021   Cirrhosis ([place])    COPD (chronic obstructive pulmonary disease) (HCC)    Diabetes mellitus without complication (HCC)    HLD (hyperlipidemia)    Past Surgical History:  Procedure Laterality Date   ABDOMINAL HYSTERECTOMY     APPENDECTOMY     BLADDER REPAIR     CESAREAN SECTION     COLONOSCOPY     ESOPHAGOGASTRODUODENOSCOPY     GALLBLADDER SURGERY     Social History:  reports that she has never smoked. She has never used smokeless tobacco. She reports that she does not drink alcohol and does not use drugs.  Allergies  Allergen Reactions   Ciprofloxacin     Levaquin [Levofloxacin]    Sulfa Antibiotics    Other Rash    Adhesive tape    No family history on file.  Prior to Admission medications   Medication Sig Start Date End Date Taking? Authorizing Provider  ferrous sulfate 325 (65 FE) MG tablet Take 325 mg by mouth daily with breakfast.    [provider]  furosemide (LASIX) 20 MG tablet Take 1 tablet (20 mg total) by mouth daily. 10/24/21   Erenest Rasher, PA-C  insulin glargine (LANTUS) 100 UNIT/ML injection Inject 5-15 Units into the skin at bedtime. Sliding scale    [provider]  insulin lispro (HUMALOG) 100 UNIT/ML injection Inject 10 Units into the skin 3 (three) times daily with meals. Sliding scale    [provider]  Magnesium Oxide 400 MG CAPS Take 1 capsule (400 mg total) by mouth in the morning and at bedtime. 12/09/21   Sherron Monday, NP  potassium chloride (KLOR-CON) 10 MEQ tablet Take 1 tablet (10 mEq total) by mouth daily. Take While taking Lasix/furosemide 03/22/21   Roxan Hockey, MD  pravastatin (PRAVACHOL) 20 MG tablet Take 20 mg by mouth every evening.    [provider]  spironolactone (ALDACTONE) 25 MG tablet Take 3 tablets (75 mg total) by mouth daily. 11/26/21 11/26/22  Sherron Monday, NP    Physical Exam: Vitals:   01/07/22 0230  01/07/22 0430 01/07/22 0500 01/07/22 0530  BP: (!) 125/53 (!) 123/59 (!) 126/59 127/60  Pulse: (!) 102 (!) 105  (!) 106  Resp: (!) '22 20 17 20  '$ Temp: 98 F (36.7 C)     TempSrc: Oral     SpO2: 100% 98%  98%  Weight:      Height:       1.  General: Patient lying supine in bed,  no acute distress   2. Psychiatric: Somnolent and oriented x self,    3. Neurologic: Somnolent, speech and language are normal, face is symmetric, moves all 4 extremities voluntarily, at baseline without acute deficits on limited exam   4. HEENMT:  Head is atraumatic, normocephalic, pupils reactive to light, neck is supple, trachea is midline, mucous  membranes are moist   5. Respiratory : Lungs are clear to auscultation bilaterally without wheezing, rhonchi, rales, no cyanosis, no increase in work of breathing or accessory muscle use   6. Cardiovascular : Heart rate normal, rhythm is regular, no murmurs, rubs or gallops, significant pitting edema, peripheral pulses palpated   7. Gastrointestinal:  Abdomen is slightly distended with fluid wave, nontender to palpation bowel sounds active, no masses or organomegaly palpated   8. Skin:  Skin is warm, dry and intact without rashes, acute lesions, or ulcers on limited exam   9.Musculoskeletal:  No acute deformities or trauma, no asymmetry in tone, significant pitting edema, peripheral pulses palpated, no tenderness to palpation in the extremities  Data Reviewed: In the ED Temp 98.4, heart rate 99-110, respiratory rate 14-30, blood pressure 114/44-139/67, satting at 98% on 2 L nasal cannula pH 7.48, PCO2 34 No leukocytosis with a white blood cell count of 6.1, hemoglobin 10.3, platelets 97 Chemistry reveals an elevated BUN at 34 and an elevated creatinine at 1.34 Hyperammonemia at 139 Lactic acid as high as 4 but downtrending UA is indicative of UTI Urine culture and blood culture pending CT head shows nothing acute Chest x-ray shows low lung volumes Patient was started on Rocephin for UTI 1 L normal saline given in the ED Patient readmitted for acute metabolic encephalopathy that is likely hepatic encephalopathy but a couple other contributors are present with UTI and hypoxia  Assessment and Plan: * Acute metabolic encephalopathy - Likely multifactorial - The biggest contributor is likely pneumonia 139 - UTI likely also contributing - Patient was hypoxic and required 2 L nasal cannula, hypoxia likely also contributing - CT head shows no acute findings - Chest x-ray shows low lung volumes - pH 7.48, PCO2 34 - Urine culture and blood culture pending - Continue to treat UTI with  Rocephin, continue treat hyperammonemia with lactulose  Liver cirrhosis secondary to NASH (nonalcoholic steatohepatitis) (HCC) - Continue spironolactone  AKI (acute kidney injury) (HCC) - Creatinine at baseline 0.73 - Creatinine today 1.54 - With little history, it is hard to know and the main contributors - Hold nephrotoxic agents when possible - Trend creatinine in the a.m. - Patient was given 1 L bolus in the ED - Continue monitor  UTI (urinary tract infection) - UA indicative of UTI - Urine culture and blood culture pending - Patient technically meets SIRS criteria but I think that has more to do with her hepatic encephalopathy than with her UTI - Lactic acid elevated, but has been downtrending - Continue to trend - Continue monitor  Hyperammonemia (HCC) - Ammonia 139 - Continue lactulose 30 g daily - Ammonia holding stable at 136 - Continue to monitor  Acute respiratory failure with hypoxia (HCC) - Hypoxic at presentation - Chest x-ray shows low lung volumes - Patient did improve and become alert enough to answer some questions -Continue O2 supplementation -PRN albuterol -Continue to monitor  Hypoalbuminemia - Albumin is 2.6 - Likely liver cirrhosis and diet contributing - Encourage nutrient dense food choices when patient is able to tolerate diet - Continue to monitor  Type 2 diabetes mellitus (HCC) - Glucose well controlled at 132 - Sliding scale ordered - Continue to monitor      Advance Care Planning:   Code Status: Prior   Consults: None  Family Communication: No family at bedside  Severity of Illness: The appropriate patient status for this patient is INPATIENT. Inpatient status is judged to be reasonable and necessary in order to provide the required intensity of service to ensure the patient's safety. The patient's presenting symptoms, physical exam findings, and initial radiographic and laboratory data in the context of their chronic  comorbidities is felt to place them at high risk for further clinical deterioration. Furthermore, it is not anticipated that the patient will be medically stable for discharge from the hospital within 2 midnights of admission.   * I certify that at the point of admission it is my clinical judgment that the patient will require inpatient hospital care spanning beyond 2 midnights from the point of admission due to high intensity of service, high risk for further deterioration and high frequency of surveillance required.*  Author: Rolla Plate, DO 01/07/2022 6:00 AM  For on call review [email or web address].

## 2022-01-07 NOTE — Progress Notes (Signed)
Paracentesis not completed due to hypotension. MD informed. Will continue to monitor.

## 2022-01-07 NOTE — Assessment & Plan Note (Signed)
-   Glucose well controlled at 132 - Sliding scale ordered - Continue to monitor

## 2022-01-07 NOTE — Assessment & Plan Note (Signed)
-   Ammonia 139 - Continue lactulose 30 g daily - Ammonia holding stable at 136 - Continue to monitor

## 2022-01-07 NOTE — Assessment & Plan Note (Signed)
-   Hypoxic at presentation - Chest x-ray shows low lung volumes - Patient did improve and become alert enough to answer some questions -Continue O2 supplementation -PRN albuterol -Continue to monitor

## 2022-01-07 NOTE — Progress Notes (Signed)
Palliative: Brooke Gutierrez is lying quietly in bed.  She appears acutely/chronically ill and quite frail.  She is resting comfortably and I do not try to wake her.  Her son and daughter are at bedside.  We plan for a family meeting tomorrow at 11/1, at bedside.  Daughter shares that Brooke Gutierrez is scheduled for paracentesis tomorrow around 9 AM.  She shares that she recently had 5 L removed, but they have set a limit at 2 L due to low blood pressure.  Plan:  Family meeting at bedside 11/1.  Brooke Gutierrez is active with hospice care.  25 minutes Quinn Axe, NP Palliative medicine team Phone [phone] Rater than 50% of this time was spent counseling and coordinating care related to the above assessment and plan.

## 2022-01-08 ENCOUNTER — Ambulatory Visit (HOSPITAL_COMMUNITY): Admission: RE | Admit: 2022-01-08 | Payer: Medicare Other | Source: Ambulatory Visit

## 2022-01-08 ENCOUNTER — Inpatient Hospital Stay (HOSPITAL_COMMUNITY): Payer: Medicare Other

## 2022-01-08 ENCOUNTER — Encounter (HOSPITAL_COMMUNITY): Payer: Self-pay | Admitting: Family Medicine

## 2022-01-08 DIAGNOSIS — K7581 Nonalcoholic steatohepatitis (NASH): Secondary | ICD-10-CM

## 2022-01-08 DIAGNOSIS — E7220 Disorder of urea cycle metabolism, unspecified: Secondary | ICD-10-CM | POA: Diagnosis not present

## 2022-01-08 DIAGNOSIS — N3000 Acute cystitis without hematuria: Secondary | ICD-10-CM | POA: Diagnosis not present

## 2022-01-08 DIAGNOSIS — R188 Other ascites: Secondary | ICD-10-CM | POA: Diagnosis not present

## 2022-01-08 DIAGNOSIS — K7460 Unspecified cirrhosis of liver: Secondary | ICD-10-CM | POA: Diagnosis not present

## 2022-01-08 DIAGNOSIS — J9601 Acute respiratory failure with hypoxia: Secondary | ICD-10-CM

## 2022-01-08 DIAGNOSIS — K7682 Hepatic encephalopathy: Secondary | ICD-10-CM | POA: Diagnosis not present

## 2022-01-08 DIAGNOSIS — N179 Acute kidney failure, unspecified: Secondary | ICD-10-CM

## 2022-01-08 DIAGNOSIS — Z7189 Other specified counseling: Secondary | ICD-10-CM | POA: Diagnosis not present

## 2022-01-08 DIAGNOSIS — G9341 Metabolic encephalopathy: Secondary | ICD-10-CM | POA: Diagnosis not present

## 2022-01-08 DIAGNOSIS — Z515 Encounter for palliative care: Secondary | ICD-10-CM | POA: Diagnosis not present

## 2022-01-08 LAB — BODY FLUID CELL COUNT WITH DIFFERENTIAL
Eos, Fluid: 0 %
Lymphs, Fluid: 71 %
Monocyte-Macrophage-Serous Fluid: 13 % — ABNORMAL LOW (ref 50–90)
Neutrophil Count, Fluid: 16 % (ref 0–25)
Total Nucleated Cell Count, Fluid: 152 cu mm (ref 0–1000)

## 2022-01-08 LAB — COMPREHENSIVE METABOLIC PANEL
ALT: 11 U/L (ref 0–44)
AST: 20 U/L (ref 15–41)
Albumin: 3.4 g/dL — ABNORMAL LOW (ref 3.5–5.0)
Alkaline Phosphatase: 55 U/L (ref 38–126)
Anion gap: 9 (ref 5–15)
BUN: 36 mg/dL — ABNORMAL HIGH (ref 8–23)
CO2: 24 mmol/L (ref 22–32)
Calcium: 8.7 mg/dL — ABNORMAL LOW (ref 8.9–10.3)
Chloride: 103 mmol/L (ref 98–111)
Creatinine, Ser: 1.1 mg/dL — ABNORMAL HIGH (ref 0.44–1.00)
GFR, Estimated: 50 mL/min — ABNORMAL LOW (ref >60)
Glucose, Bld: 124 mg/dL — ABNORMAL HIGH (ref 70–99)
Potassium: 3.6 mmol/L (ref 3.5–5.1)
Sodium: 136 mmol/L (ref 135–145)
Total Bilirubin: 6.2 mg/dL — ABNORMAL HIGH (ref 0.3–1.2)
Total Protein: 5.2 g/dL — ABNORMAL LOW (ref 6.5–8.1)

## 2022-01-08 LAB — PROTIME-INR
INR: 2.3 — ABNORMAL HIGH (ref 0.8–1.2)
Prothrombin Time: 25 seconds — ABNORMAL HIGH (ref 11.4–15.2)

## 2022-01-08 LAB — GLUCOSE, CAPILLARY
Glucose-Capillary: 113 mg/dL — ABNORMAL HIGH (ref 70–99)
Glucose-Capillary: 123 mg/dL — ABNORMAL HIGH (ref 70–99)

## 2022-01-08 LAB — GRAM STAIN: Gram Stain: NONE SEEN

## 2022-01-08 LAB — MAGNESIUM: Magnesium: 1.9 mg/dL (ref 1.7–2.4)

## 2022-01-08 LAB — AMMONIA: Ammonia: 104 umol/L — ABNORMAL HIGH (ref 9–35)

## 2022-01-08 MED ORDER — LIDOCAINE HCL (PF) 1 % IJ SOLN
INTRAMUSCULAR | Status: AC
  Administered 2022-01-08: 10 mL via INTRADERMAL
  Filled 2022-01-08: qty 10

## 2022-01-08 MED ORDER — ALBUMIN HUMAN 25 % IV SOLN
25.0000 g | Freq: Four times a day (QID) | INTRAVENOUS | Status: AC
  Administered 2022-01-08 – 2022-01-09 (×4): 25 g via INTRAVENOUS
  Filled 2022-01-08 (×4): qty 100

## 2022-01-08 MED ORDER — ALBUMIN HUMAN 25 % IV SOLN
INTRAVENOUS | Status: AC
  Administered 2022-01-08: 50 g via INTRAVENOUS
  Filled 2022-01-08: qty 200

## 2022-01-08 MED ORDER — LIDOCAINE HCL (PF) 1 % IJ SOLN
10.0000 mL | Freq: Once | INTRAMUSCULAR | Status: AC
  Administered 2022-01-08: 10 mL via INTRADERMAL

## 2022-01-08 NOTE — Consult Note (Signed)
Referring Provider: No ref. provider found Primary Care Physician:  Neale Burly, MD Primary Gastroenterologist:  Dr.Rourk   Date of Admission: 01/04/2022  Date of Consultation: 01/08/22  Reason for Consultation:  decompensated liver cirrhosis/evaluation for peritoneal catheter   HPI:  Brooke Gutierrez is a 86 y.o. year old female with medical history significant of CHF, NASH cirrhosis, COPD, diabetes mellitus type 2, hyperlipidemia, Hodgkin's disease (2008) who presented to the ED on 10/30 with AMS.  Patient not able to provide any history. EMS reported the patient was sent from home for evaluation of altered mental status.  Patient's husband had reported the patient was confused and disoriented.  Initial labs showed ammonia 136,The patient was admitted for acute metabolic encephalopathy that was multifactorial.  started on lactulose with improvement in MS.  During her hospital stay, the patient had soft BPs, As result, her spironolactone and furosemide were discontinued.  Palliative medicine consulted to assist with management.  Notably, the patient is ready being followed by hospice at home. Paracentesis was requested, but fluid removal was limited secondary to the patient's soft blood pressures and hypotension. GI consulted for consideration of peritoneal catheter for ascites.   Consult:   History: cirrhosis for approximately 12 years, complicated by thrombocytopenia, elevated INR, esophageal and gastric varices s/p banding in 2016, recurrent ascites refractory to diuretics requiring weekly paracentesis and electrolyte abnormalities. patient is followed outpatient by our GI team, recently seen by GI provider on 10/26, at that time weight ranging from 130-147. Receiveing weekly paracentesis with approximately 4L removal of fluid each time. Considerations for Pleurx peritoneal catheter placement for drainage of ascites a thome given difficulty of mobility and getting to appts. As patient currently  under hospice care, was advised to reach out to Hospice physician to order IR consult for further discussion of placement of Pleurx catheter. She was maintained on lasix '20mg'$  and spironolactone '75mg'$  daily  Present:  Patient's family is present at bedside. Patient is alert and oriented. Daughter Brooke Gutierrez who is patient's caregiver helps provide history. She states that patient became more confused prompting them to bring her in, she seems back to baseline today. They are supposed to have a family meeting with hospice tomorrow for further discussion of plan of care. Daughter is concerned that patient would not even be strong enough to tolerate IR procedure for catheter placement, she was told earlier by IR that this would have to be done at [facility] and she is also concerned about patient having to get there, even as an inpatient. She does not think that she would be able to transport patient on outpatient basis at this time to have IR consult for catheter placement. Family and patient would like to wait until after their meeting with hospice tomorrow to decide if they in fact do want to pursue placement of pleurx catheter for her ascites.    Past Medical History:  Diagnosis Date   CHF (congestive heart failure) ([place])    ?normal ECHO 02/2021   Cirrhosis ([place])    COPD (chronic obstructive pulmonary disease) ([facility])    Diabetes mellitus without complication ([facility])    HLD (hyperlipidemia)     Past Surgical History:  Procedure Laterality Date   ABDOMINAL HYSTERECTOMY     APPENDECTOMY     BLADDER REPAIR     CESAREAN SECTION     COLONOSCOPY     ESOPHAGOGASTRODUODENOSCOPY     GALLBLADDER SURGERY      Prior to Admission medications   Medication Sig Start  Date End Date Taking? Authorizing Provider  insulin glargine (LANTUS) 100 UNIT/ML injection Inject 5-15 Units into the skin at bedtime. Sliding scale   Yes [provider]  insulin lispro (HUMALOG) 100 UNIT/ML injection Inject 10 Units into the skin  3 (three) times daily with meals. Sliding scale   Yes [provider]  Magnesium Oxide 400 MG CAPS Take 1 capsule (400 mg total) by mouth in the morning and at bedtime. 12/09/21  Yes Mahon, Lenise Arena, NP  pravastatin (PRAVACHOL) 20 MG tablet Take 20 mg by mouth every evening.   Yes [provider]  spironolactone (ALDACTONE) 25 MG tablet Take 3 tablets (75 mg total) by mouth daily. 11/26/21 11/26/22 Yes Mahon, Lenise Arena, NP  potassium chloride (KLOR-CON) 10 MEQ tablet Take 1 tablet (10 mEq total) by mouth daily. Take While taking Lasix/furosemide Patient not taking: Reported on 01/07/2022 03/22/21   Roxan Hockey, MD    Current Facility-Administered Medications  Medication Dose Route Frequency Provider Last Rate Last Admin   acetaminophen (TYLENOL) tablet 650 mg  650 mg Oral Q6H PRN Zierle-Ghosh, Asia B, DO       Or   acetaminophen (TYLENOL) suppository 650 mg  650 mg Rectal Q6H PRN Zierle-Ghosh, Asia B, DO       albumin human 25 % solution 25 g  25 g Intravenous Q6H Orson Eva, MD 60 mL/hr at 01/08/22 1033 25 g at 01/08/22 1033   albuterol (PROVENTIL) (2.5 MG/3ML) 0.083% nebulizer solution 2.5 mg  2.5 mg Nebulization Q4H PRN Zierle-Ghosh, Asia B, DO       Chlorhexidine Gluconate Cloth 2 % PADS 6 each  6 each Topical Daily Barton Dubois, MD   6 each at 01/08/22 0915   lactulose (CHRONULAC) 10 GM/15ML solution 30 g  30 g Oral TID Barton Dubois, MD   30 g at 01/08/22 0915   meropenem (MERREM) 1 g in sodium chloride 0.9 % 100 mL IVPB  1 g Intravenous Q12H Barton Dubois, MD 200 mL/hr at 01/08/22 0914 1 g at 01/08/22 0914   ondansetron (ZOFRAN) tablet 4 mg  4 mg Oral Q6H PRN Zierle-Ghosh, Asia B, DO       Or   ondansetron (ZOFRAN) injection 4 mg  4 mg Intravenous Q6H PRN Zierle-Ghosh, Asia B, DO       Oral care mouth rinse  15 mL Mouth Rinse PRN Barton Dubois, MD       oxyCODONE (Oxy IR/ROXICODONE) immediate release tablet 5 mg  5 mg Oral Q6H PRN Barton Dubois, MD   5 mg at  01/07/22 1326   pravastatin (PRAVACHOL) tablet 20 mg  20 mg Oral QPM Zierle-Ghosh, Asia B, DO   20 mg at 01/07/22 1704    Allergies as of 12/19/2021 - Review Complete 12/16/2021  Allergen Reaction Noted   Ciprofloxacin  09/30/2018   Levaquin [levofloxacin]  09/30/2018   Sulfa antibiotics  09/30/2018   Other Rash 08/03/2019    History reviewed. No pertinent family history.  Social History   Socioeconomic History   Marital status: Married    Spouse name: Not on file   Number of children: Not on file   Years of education: Not on file   Highest education level: Not on file  Occupational History   Not on file  Tobacco Use   Smoking status: Never   Smokeless tobacco: Never  Vaping Use   Vaping Use: Never used  Substance and Sexual Activity   Alcohol use: Never   Drug use: Never  Sexual activity: Not Currently  Other Topics Concern   Not on file  Social History Narrative   Not on file   Social Determinants of Health   Financial Resource Strain: Not on file  Food Insecurity: No Food Insecurity (01/07/2022)   Hunger Vital Sign    Worried About Running Out of Food in the Last Year: Never true    Ran Out of Food in the Last Year: Never true  Transportation Needs: No Transportation Needs (01/07/2022)   PRAPARE - Hydrologist (Medical): No    Lack of Transportation (Non-Medical): No  Physical Activity: Not on file  Stress: Not on file  Social Connections: Not on file  Intimate Partner Violence: Not At Risk (01/07/2022)   Humiliation, Afraid, Rape, and Kick questionnaire    Fear of Current or Ex-Partner: No    Emotionally Abused: No    Physically Abused: No    Sexually Abused: No   Review of Systems: Gen: Denies fever, chills, loss of appetite, change in weight or weight loss CV: Denies chest pain, heart palpitations, syncope, edema  Resp: Denies shortness of breath with rest, cough, wheezing GI: denies melena, hematochezia, nausea,  vomiting, diarrhea, constipation, dysphagia, odyonophagia, early satiety or weight loss.  GU : Denies urinary burning, urinary frequency, urinary incontinence.  MS: Denies joint pain,swelling, cramping Derm: Denies rash, itching, dry skin Psych: Denies depression, anxiety,confusion, or memory loss Heme: Denies bruising, bleeding, and enlarged lymph nodes.  Physical Exam: Vital signs in last 24 hours: Temp:  [97.8 F (36.6 C)-98.5 F (36.9 C)] 97.8 F (36.6 C) (11/01 0500) Pulse Rate:  [86-113] 92 (11/01 1000) Resp:  [12-28] 28 (11/01 1040) BP: (92-133)/(33-58) 112/40 (11/01 1040) SpO2:  [91 %-100 %] 95 % (11/01 1000) Last BM Date : 01/08/22 General:   Alert,  Well-developed, well-nourished, pleasant and cooperative in NAD Head:  Normocephalic and atraumatic. Eyes:  scleral icterus Ears:  Normal auditory acuity. Nose:  No deformity, discharge,  or lesions. Mouth:  No deformity or lesions, dentition normal. Lungs:  Clear throughout to auscultation.   No wheezes, crackles, or rhonchi. No acute distress. Heart:  Regular rate and rhythm; no murmurs, clicks, rubs,  or gallops. Abdomen:  +BS nontender, moderate ascites but abdomen soft, not taut  Rectal:  Deferred until time of colonoscopy.   Msk:  Symmetrical without gross deformities. Normal posture. Pulses:  Normal pulses noted. Extremities:  Without clubbing or edema. Neurologic:  Alert and  oriented x4;  grossly normal neurologically. No asterixis  Skin:  jaundice Psych:  Alert and cooperative. Normal mood and affect.  Intake/Output from previous day: 10/31 0701 - 11/01 0700 In: 1936.3 [I.V.:880.4; IV Piggyback:1055.9] Out: -  Intake/Output this shift: Total I/O In: 240 [P.O.:240] Out: -   Lab Results: Recent Labs    12/16/2021 2336 01/07/22 0434  WBC 6.1 6.4  HGB 10.3* 9.8*  HCT 29.9* 29.0*  PLT 97* 90*   BMET Recent Labs    12/20/2021 2336 01/07/22 0434 01/08/22 0325  NA 131* 131* 136  K 3.6 3.9 3.6  CL 96*  96* 103  CO2 '23 23 24  '$ GLUCOSE 132* 144* 124*  BUN 34* 37* 36*  CREATININE 1.34* 1.38* 1.10*  CALCIUM 8.8* 8.8* 8.7*   LFT Recent Labs    12/24/2021 2336 01/07/22 0434 01/08/22 0325  PROT 6.0* 5.7* 5.2*  ALBUMIN 2.6* 2.6* 3.4*  AST 32 33 20  ALT '15 17 11  '$ ALKPHOS 128* 117 55  BILITOT 6.3*  6.5* 6.2*   PT/INR Recent Labs    12/25/2021 2336  LABPROT 17.8*  INR 1.5*   Impression: Brooke Gutierrez is a 86 y.o. year old female with medical history significant of CHF, NASH cirrhosis, COPD, diabetes mellitus type 2, hyperlipidemia, Hodgkin's disease (2008) who presented to the ED on 10/30 with AMS. Course complicated by acute kidney injury, diuretics discontinued due to AKI and electrolyte imbalance. GI consulted for further evaluation of peritoneal catheter.   Cirrhosis with ongoing ascites: cirrhosis for approximately 12 years, complicated by thrombocytopenia, elevated INR, esophageal and gastric varices s/p banding in 2016, recurrent ascites refractory to diuretics requiring weekly paracentesis.  AMS with ammonia 136 on admission, down to 104, she is on lactulose 30g TID.  MELD 20** (INR from 10/30) .  Receiving weekly outpatient paracentesis with approximately 4L removal of fluid each time. Paracentesis this morning with yield of 2L, additional ascites remained due to hypotension.  Considerations for Pleurx peritoneal catheter placement for drainage of ascites discussed at last GI OV as patient and family were interested in pursing this given difficulty of mobility and getting to appts. While peritoneal catheters are not ideal due to increased infection risks/peritonitis, as this point this may be the best option in managing ascites due to her intolerance to diuretics and hypotension. I discussed this with the patient and family at bedside today, mainly her daughter Brooke Gutierrez. At this time, they are planning to have a palliative care/hospice meeting with the patient and family tomorrow to review  plan of cares and would like to hold off on pursing IR evaluation for the pleurx catheter at this time as daughter is concerned the patient may not even be strong enough to tolerate transport to [facility] or the actual procedure itself and would like to discuss goals of care with hospice prior to any further procedures. I did reiterate the plerux does put patient at higher risk for developing infections but would also provide symptomatic management of her ascites as diuretics and paracentesis therapy are also becoming less ideal given patient's decline, though verbalized understanding of their concerns and desire to hold off on IR consult. Daughter verbalized understanding of the above, we will discuss further wishes regarding IR consult tomorrow after their meeting with hospice.   Plan: Potential IR consult for Plerux peritoneal catheter based on patient/family wishes after upcoming hospice meeting 2g sodium diet MELD labs daily, will update INR today Monitor for signs of HE Appreciate palliative/hospice input Agree with albumin 25g q6h Continue Lactulose 30g TID, monitor for signs of HE Supportive care    LOS: 1 day    01/08/2022, 11:37 AM  Soren Lazarz L. Alver Sorrow, MSN, APRN, AGNP-C Adult-Gerontology Nurse Practitioner [facility]

## 2022-01-08 NOTE — Progress Notes (Signed)
[place] at bedside currently to preform paracentesis.

## 2022-01-08 NOTE — Procedures (Signed)
PROCEDURE SUMMARY:  Successful US guided paracentesis from RLQ.  Yielded 2.0L of ascitic fluid.  No immediate complications.  Pt tolerated well.   Specimen was sent for labs.  EBL < 66m  Jawad Wiacek PA-C 01/08/2022 10:09 AM

## 2022-01-08 NOTE — Hospital Course (Addendum)
86 y.o. female with medical history significant of CHF, NASH cirrhosis, COPD, diabetes mellitus type 2, hyperlipidemia, Hodgkin's disease (2008) presents the ED with a chief complaint of altered mental status.  Patient is not able to provide any history.  EMS reported the patient was sent from home for evaluation of altered mental status.    Patient's husband had reported the patient was confused and disoriented.  Review patient says she does not know why she is here initially.   Patient was alert and oriented x1 initially.  Initial labs showed ammonia 136 with pyuria.  Her sodium was 131 with a serum creatinine 1.34.  The patient was admitted for acute metabolic encephalopathy that was multifactorial.  She was started on lactulose with improvement of her sensorium.  During her hospital stay, the patient had soft blood pressures.  As result, her spironolactone and furosemide were discontinued.  Palliative medicine was consulted to assist with management.  Notably, the patient is ready being followed by hospice at home.  Paracentesis was requested, but fluid removal was limited secondary to the patient's soft blood pressures and hypotension.  On 01/08/2022, paracentesis was performed and 2 L were is only able to be removed secondary to the patient developing hypotension.  Palliative medicine continue to follow patient. I had numerous discussions with the patient and family, the patient's mental status returned back to baseline as her ammonia improved and the UTI was treated. After family meeting on 01/09/2022 where goals of care were discussed once again, the patient understood her poor prognosis in the setting of recurrent ascites and decompensated liver cirrhosis.  She made informed decision to go home with a focus on comfort measures care only.  TOC was consulted to assist in transition home with hospice and the appropriate DME.  On the evening 01/09/22, I was notified by patient's daughter that they felt they  could properly care for the patient at home.  They requested for a place at residential hospice.  Unfortunately, the patient worsen symptomatically and comfort measures with IV opioids and benzodiazepines were started.  On 2022-01-29, it was felt that the patient was not stable enough to transfer to [facility].  Therefore, comfort care measures were continued for the duration of her hospitalization.

## 2022-01-08 NOTE — Progress Notes (Signed)
PROGRESS NOTE  Brooke Gutierrez WGN:[id number] DOB: 1935-12-18 DOA: 12/28/2021 PCP: Neale Burly, MD  Brief History:  86 y.o. female with medical history significant of CHF, NASH cirrhosis, COPD, diabetes mellitus type 2, hyperlipidemia, Hodgkin's disease (2008) presents the ED with a chief complaint of altered mental status.  Patient is not able to provide any history.  EMS reported the patient was sent from home for evaluation of altered mental status.    Patient's husband had reported the patient was confused and disoriented.  Review patient says she does not know why she is here initially.   Patient was alert and oriented x1 initially.  Initial labs showed ammonia 136 with pyuria.  Her sodium was 131 with a serum creatinine 1.34.  The patient was admitted for acute metabolic encephalopathy that was multifactorial.  She was started on lactulose with improvement of her sensorium.  During her hospital stay, the patient had soft blood pressures.  As result, her spironolactone and furosemide were discontinued.  Palliative medicine was consulted to assist with management.  Notably, the patient is ready being followed by hospice at home.  Paracentesis was requested, but fluid removal was limited secondary to the patient's soft blood pressures and hypotension.   Assessment/Plan: Acute metabolic encephalopathy -Multifactorial including hepatic encephalopathy, AKI, UTI, hyponatremia -Sensorium improving with lactulose -01/08/2022--mental status continues to improve  Decompensated liver cirrhosis -Unfortunately, fluid removal by paracentesis is limited secondary to the patient's soft blood pressure and hypotension -Holding furosemide and spironolactone (would likely result in more rapid reaccumulation of fluid) -Low-sodium diet  Hepatic encephalopathy -Presented with ammonia 136>>104 -New lactulose  Acute kidney injury Baseline creatinine 0.7-0.9 -Presented with serum creatinine  1.34 -Secondary to third spacing -Patient was given 1 L fluid bolus in the ED -Give albumin -Judicious IV fluid  UTI -UA 21-50 WBC -Continue empiric ceftriaxone pending culture data  Lactic acidosis -Secondary to decreased hepatic clearance -Sepsis ruled out  Controlled diabetes mellitus type 2 -Allow for liberal glycemic control at this point -12/05/2021 hemoglobin A1c 4.9 -Discontinue NovoLog sliding scale  COPD -Stable presently  Chronic respiratory failure with hypoxia -Patient uses 2 L nasal cannula as needed  Hypomagnesemia -Replete  Hyponatremia -Secondary to liver cirrhosis -Overall stable and improving    Family Communication:   Family at bedside  Consultants:  palliative  Code Status:  DNR-confirmed with patient 11/1  DVT Prophylaxis:  SCDs   Procedures: As Listed in Progress Note Above  Antibiotics: None  RN Pressure Injury Documentation:        Subjective: Patient denies fevers, chills, headache, chest pain, dyspnea, nausea, vomiting, diarrhea, abdominal pain, dysuria, hematuria, hematochezia, and melena.   Objective: Vitals:   01/08/22 0900 01/08/22 0930 01/08/22 0935 01/08/22 0945  BP: (!) 108/46 (!) 114/48 (!) 113/39 (!) 118/49  Pulse: 94 97  95  Resp: (!) '22 17 19 '$ (!) 28  Temp:      TempSrc:      SpO2: 95% 96% 95% 95%  Weight:      Height:        Intake/Output Summary (Last 24 hours) at 01/08/2022 0948 Last data filed at 01/08/2022 0813 Gross per 24 hour  Intake 2176.28 ml  Output --  Net 2176.28 ml   Weight change:  Exam:  General:  Pt is alert, follows commands appropriately, not in acute distress HEENT: No icterus, No thrush, No neck mass, [place]/AT Cardiovascular: RRR, S1/S2, no rubs, no gallops Respiratory: bibasilar crackles.  No wheeze Abdomen: Soft/+BS, non tender, non distended, no guarding Extremities: No edema, No lymphangitis, No petechiae, No rashes, no synovitis   Data Reviewed: I have personally  reviewed following labs and imaging studies Basic Metabolic Panel: Recent Labs  Lab 12/24/2021 2336 01/07/22 0434 01/08/22 0325  NA 131* 131* 136  K 3.6 3.9 3.6  CL 96* 96* 103  CO2 '23 23 24  '$ GLUCOSE 132* 144* 124*  BUN 34* 37* 36*  CREATININE 1.34* 1.38* 1.10*  CALCIUM 8.8* 8.8* 8.7*  MG  --  1.4* 1.9   Liver Function Tests: Recent Labs  Lab 12/10/2021 2336 01/07/22 0434 01/08/22 0325  AST 32 33 20  ALT '15 17 11  '$ ALKPHOS 128* 117 55  BILITOT 6.3* 6.5* 6.2*  PROT 6.0* 5.7* 5.2*  ALBUMIN 2.6* 2.6* 3.4*   No results for input(s): "LIPASE", "AMYLASE" in the last 168 hours. Recent Labs  Lab 12/09/2021 2336 01/07/22 0434 01/08/22 0802  AMMONIA 139* 136* 104*   Coagulation Profile: Recent Labs  Lab 12/13/2021 2336  INR 1.5*   CBC: Recent Labs  Lab 12/22/2021 2336 01/07/22 0434  WBC 6.1 6.4  NEUTROABS 5.0 5.0  HGB 10.3* 9.8*  HCT 29.9* 29.0*  MCV 98.4 101.0*  PLT 97* 90*   Cardiac Enzymes: No results for input(s): "CKTOTAL", "CKMB", "CKMBINDEX", "TROPONINI" in the last 168 hours. BNP: Invalid input(s): "POCBNP" CBG: Recent Labs  Lab 01/07/22 1139 01/07/22 1629 01/07/22 2116 01/08/22 0722  GLUCAP 148* 141* 133* 113*   HbA1C: No results for input(s): "HGBA1C" in the last 72 hours. Urine analysis:    Component Value Date/Time   COLORURINE AMBER (A) 01/07/2022 0126   APPEARANCEUR CLOUDY (A) 01/07/2022 0126   LABSPEC 1.017 01/07/2022 0126   PHURINE 8.0 01/07/2022 0126   GLUCOSEU NEGATIVE 01/07/2022 0126   HGBUR NEGATIVE 01/07/2022 0126   BILIRUBINUR NEGATIVE 01/07/2022 0126   KETONESUR NEGATIVE 01/07/2022 0126   PROTEINUR >=300 (A) 01/07/2022 0126   NITRITE NEGATIVE 01/07/2022 0126   LEUKOCYTESUR MODERATE (A) 01/07/2022 0126   Sepsis Labs: '@LABRCNTIP'$ (procalcitonin:4,lacticidven:4) ) Recent Results (from the past 240 hour(s))  Culture, body fluid w Gram Stain-bottle     Status: None   Collection Time: 01/01/22  1:16 PM   Specimen: Peritoneal  Washings  Result Value Ref Range Status   Specimen Description PERITONEAL  Final   Special Requests BOTTLES DRAWN AEROBIC AND ANAEROBIC 10CC  Final   Culture   Final    NO GROWTH 5 DAYS Performed at [facility], [address], [place], [place] [address]    Report Status 01/07/2022 FINAL  Final  Gram stain     Status: None   Collection Time: 01/01/22  1:16 PM   Specimen: Peritoneal Washings  Result Value Ref Range Status   Specimen Description PERITONEAL  Final   Special Requests NONE  Final   Gram Stain   Final    WBC PRESENT,BOTH PMN AND MONONUCLEAR NO ORGANISMS SEEN CYTOSPIN SMEAR Performed at [facility], [address], [place], [place] [address]    Report Status 01/01/2022 FINAL  Final  Blood Culture (routine x 2)     Status: None (Preliminary result)   Collection Time: 12/28/2021 11:36 PM   Specimen: BLOOD LEFT ARM  Result Value Ref Range Status   Specimen Description Peripheral BLOOD LEFT ARM  Final   Special Requests   Final    BOTTLES DRAWN AEROBIC AND ANAEROBIC Blood Culture results may not be optimal due to an excessive volume of blood received  in culture bottles   Culture   Final    NO GROWTH 2 DAYS Performed at [facility], [address] [place], [place] [address]    Report Status PENDING  Incomplete  Blood Culture (routine x 2)     Status: None (Preliminary result)   Collection Time: 12/25/2021 11:36 PM   Specimen: BLOOD LEFT HAND  Result Value Ref Range Status   Specimen Description Peripheral BLOOD LEFT HAND  Final   Special Requests   Final    BOTTLES DRAWN AEROBIC AND ANAEROBIC Blood Culture adequate volume   Culture   Final    NO GROWTH 2 DAYS Performed at [facility], [address], [place], [place] [address]    Report Status PENDING  Incomplete  Urine Culture     Status: Abnormal (Preliminary result)   Collection Time: 01/07/22  1:26 AM   Specimen: Urine, Clean Catch  Result Value Ref Range Status   Specimen Description   Final    URINE, CLEAN  CATCH Performed at [facility], [address], [place], [place] [address]    Special Requests   Final    NONE Performed at [facility], [address] [place], [place] [address]    Culture (A)  Final    >=100,000 COLONIES/mL GRAM NEGATIVE RODS SUSCEPTIBILITIES TO FOLLOW Performed at [facility], [place] [address], [place], [place] [address]    Report Status PENDING  Incomplete  MRSA Next Gen by PCR, Nasal     Status: None   Collection Time: 01/07/22  8:31 AM   Specimen: Nasal Mucosa; Nasal Swab  Result Value Ref Range Status   MRSA by PCR Next Gen NOT DETECTED NOT DETECTED Final    Comment: (NOTE) The GeneXpert MRSA Assay (FDA approved for NASAL specimens only), is one component of a comprehensive MRSA colonization surveillance program. It is not intended to diagnose MRSA infection nor to guide or monitor treatment for MRSA infections. Test performance is not FDA approved in patients less than 76 years old. Performed at [facility], [address], [place], [place] [address]      Scheduled Meds:  lidocaine (PF)       Chlorhexidine Gluconate Cloth  6 each Topical Daily   heparin  5,000 Units Subcutaneous Q8H   lactulose  30 g Oral TID   pravastatin  20 mg Oral QPM   Continuous Infusions:  sodium chloride 50 mL/hr at 01/08/22 0513   meropenem (MERREM) IV 1 g (01/08/22 0914)    Procedures/Studies: [place] ASCITES (ABDOMEN LIMITED)  Result Date: 01/07/2022 CLINICAL DATA:  Cirrhosis, ascites EXAM: LIMITED ABDOMEN ULTRASOUND FOR ASCITES TECHNIQUE: Limited ultrasound survey for ascites was performed in all four abdominal quadrants. COMPARISON:  01/01/2022 FINDINGS: Significant ascites present in the abdomen. Volume of ascites is sufficient for paracentesis. Initially, paracentesis was delayed due to hypotension. With improvement in blood pressure, attempted paracentesis was then considered but patient declined procedure, preferring to wait until tomorrow. IMPRESSION: Sufficient  ascites for paracentesis. Electronically Signed   By: Lavonia Dana M.D.   On: 01/07/2022 15:07   CT Head Wo Contrast  Result Date: 12/31/2021 CLINICAL DATA:  Mental status change, unknown cause EXAM: CT HEAD WITHOUT CONTRAST TECHNIQUE: Contiguous axial images were obtained from the base of the skull through the vertex without intravenous contrast. RADIATION DOSE REDUCTION: This exam was performed according to the departmental dose-optimization program which includes automated exposure control, adjustment of the mA and/or kV according to patient size and/or use of iterative  reconstruction technique. COMPARISON:  None Available. FINDINGS: Brain: No acute intracranial abnormality. Specifically, no hemorrhage, hydrocephalus, mass lesion, acute infarction, or significant intracranial injury. Vascular: No hyperdense vessel or unexpected calcification. Skull: No acute calvarial abnormality. Sinuses/Orbits: No acute findings Other: None IMPRESSION: No acute intracranial abnormality. Electronically Signed   By: Rolm Baptise M.D.   On: 01/07/2022 23:54   DG Chest Port 1 View  Result Date: 12/17/2021 CLINICAL DATA:  Questionable sepsis - evaluate for abnormality EXAM: PORTABLE CHEST 1 VIEW COMPARISON:  Chest radiograph 10/31/2021, CT 09/09/2021 FINDINGS: Right chest port remains in place. Lung volumes are low. The heart is normal in size. Mild bibasilar atelectasis without confluent airspace disease. No pneumothorax or large pleural effusion. No acute osseous findings. IMPRESSION: Low lung volumes with mild bibasilar atelectasis. Electronically Signed   By: Keith Rake M.D.   On: 12/08/2021 23:54   US Paracentesis  Result Date: 01/01/2022 INDICATION: Cirrhosis; recurrent ascites EXAM: ULTRASOUND GUIDED LLQ PARACENTESIS MEDICATIONS: 10 cc 1% lidocaine COMPLICATIONS: None immediate. PROCEDURE: Informed written consent was obtained from the patient after a discussion of the risks, benefits and alternatives to  treatment. A timeout was performed prior to the initiation of the procedure. Initial ultrasound scanning demonstrates a large amount of ascites within the left lower abdominal quadrant. The left lower abdomen was prepped and draped in the usual sterile fashion. 1% lidocaine was used for local anesthesia. Following this, a Yueh catheter was introduced. An ultrasound image was saved for documentation purposes. The paracentesis was performed. The catheter was removed and a dressing was applied. The patient tolerated the procedure well without immediate post procedural complication. Patient received post-procedure intravenous albumin; see nursing notes for details. FINDINGS: A total of approximately 2.6 liters of yellow fluid was removed. Samples were sent to the laboratory as requested by the clinical team. IMPRESSION: Successful ultrasound-guided paracentesis yielding 2.6 liters of peritoneal fluid. The patient has previously been formally evaluated by the [facility] and is being actively followed for potential future intervention. Patient states Hospice has evaluated her and is now transitioning to their service. She says they have discussed possible PleurX catheter placement in near future. Read by Lavonia Drafts [facility] Electronically Signed   By: Lavonia Dana M.D.   On: 01/01/2022 14:01   US Paracentesis  Result Date: 12/26/2021 INDICATION: Cirrhosis; recurrent ascites EXAM: ULTRASOUND GUIDED LLQ PARACENTESIS MEDICATIONS: 10 cc 1% lidocaine. COMPLICATIONS: None immediate. PROCEDURE: Informed written consent was obtained from the patient after a discussion of the risks, benefits and alternatives to treatment. A timeout was performed prior to the initiation of the procedure. Initial ultrasound scanning demonstrates a large amount of ascites within the left lower abdominal quadrant. The left lower abdomen was prepped and draped in the usual sterile fashion. 1%  lidocaine was used for local anesthesia. Following this, a Yueh catheter was introduced. An ultrasound image was saved for documentation purposes. The paracentesis was performed. The catheter was removed and a dressing was applied. The patient tolerated the procedure well without immediate post procedural complication. Patient received post-procedure intravenous albumin; see nursing notes for details. FINDINGS: A total of approximately 5.6 liters of dark yellow fluid was removed. Samples were sent to the laboratory as requested by the clinical team. IMPRESSION: Successful ultrasound-guided paracentesis yielding 5.6 liters of peritoneal fluid. PLAN: The patient has previously been formally evaluated by the [facility] and is being actively followed for potential future intervention. Patient states Hospice has evaluated her  and is now transitioning to their service. She says they have discussed possible PleurX catheter placement in near future. Read by Lavonia Drafts [facility] Electronically Signed   By: San Morelle M.D.   On: 12/26/2021 14:56   US Paracentesis  Result Date: 12/19/2021 INDICATION: Cirrhosis, ascites EXAM: ULTRASOUND GUIDED DIAGNOSTIC AND THERAPEUTIC PARACENTESIS MEDICATIONS: None. COMPLICATIONS: None immediate. PROCEDURE: Informed written consent was obtained from the patient after a discussion of the risks, benefits and alternatives to treatment. A timeout was performed prior to the initiation of the procedure. Initial ultrasound scanning demonstrates a large amount of ascites within the right lower abdominal quadrant. The right lower abdomen was prepped and draped in the usual sterile fashion. 1% lidocaine was used for local anesthesia. Following this, a 19 gauge, 7-cm, Yueh catheter was introduced. An ultrasound image was saved for documentation purposes. The paracentesis was performed. The catheter was removed and a dressing was applied.  The patient tolerated the procedure well without immediate post procedural complication. Patient received post-procedure intravenous albumin; see nursing notes for details. FINDINGS: A total of approximately 4.3 L of yellow ascitic fluid was removed. Samples were sent to the laboratory as requested by the clinical team. IMPRESSION: Successful ultrasound-guided paracentesis yielding 4.3 liters of peritoneal fluid. Electronically Signed   By: Lavonia Dana M.D.   On: 12/19/2021 14:34   US Paracentesis  Result Date: 12/11/2021 INDICATION: Cirrhosis, ascites EXAM: ULTRASOUND GUIDED DIAGNOSTIC AND THERAPEUTIC PARACENTESIS MEDICATIONS: None. COMPLICATIONS: None immediate. PROCEDURE: Informed written consent was obtained from the patient after a discussion of the risks, benefits and alternatives to treatment. A timeout was performed prior to the initiation of the procedure. Initial ultrasound scanning demonstrates a large amount of ascites within the right lower abdominal quadrant. The right lower abdomen was prepped and draped in the usual sterile fashion. 1% lidocaine was used for local anesthesia. Following this, a 19 gauge, 7-cm, Yueh catheter was introduced. An ultrasound image was saved for documentation purposes. The paracentesis was performed. The catheter was removed and a dressing was applied. The patient tolerated the procedure well without immediate post procedural complication. FINDINGS: A total of approximately 4.2 L of yellow ascitic fluid was removed. Samples were sent to the laboratory as requested by the clinical team. IMPRESSION: Successful ultrasound-guided paracentesis yielding 4.2 liters of peritoneal fluid. Electronically Signed   By: Lavonia Dana M.D.   On: 12/11/2021 14:36    Orson Eva, DO  [facility]  If 7PM-7AM, please contact night-coverage [email or web address] Password [email or web address] 01/08/2022, 9:48 AM   LOS: 1 day

## 2022-01-08 NOTE — Progress Notes (Signed)
Purple armband placed on pt at this time.

## 2022-01-08 NOTE — Consult Note (Signed)
Consultation Note Date: 01/08/2022   Patient Name: Brooke Gutierrez  DOB: 1935-11-15  MRN: [id number]  Age / Sex: 86 y.o., female  PCP: Neale Burly, MD Referring Physician: Orson Eva, MD  Reason for Consultation: Establishing goals of care  HPI/Patient Profile: 86 y.o. female  with past medical history of Karlene Lineman cirrhosis, CHF, COPD, DM 2, HLD, "treat the treatable" at home hospice care with hospice of [facility] started approximately 1-2 weeks ago.  Admitted on 12/28/2021 with acute metabolic encephalopathy, decompensated liver cirrhosis, acute kidney injury, UTI.   Clinical Assessment and Goals of Care: I have reviewed medical records including EPIC notes, labs and imaging, received report from RN, assessed the patient.  Brooke Gutierrez is lying quietly in bed.  She appears acutely/chronically ill and quite frail.  She is alert and oriented x3, able to make her basic needs known.  She is surrounded by her loving family including her husband Jeneen Rinks, her daughter Letta Median, and her son Audry Pili.  We meet at bedside along with husband Jeneen Rinks, daughter Letta Median and son Audry Pili to discuss diagnosis prognosis, [place], EOL wishes, disposition and options.  I introduced Palliative Medicine as specialized medical care for people living with serious illness. It focuses on providing relief from the symptoms and stress of a serious illness. The goal is to improve quality of life for both the patient and the family.  We focused on their current illness.  Overall, patient and family seem knowledgeable about Brooke Gutierrez acute health concerns and her chronic illness burden.  They shared that she has been active with hospice of [facility] for approximately 1 to 2 weeks.  They share that hospice was working with him for Pleurx drain placement.  The natural disease trajectory and expectations at EOL were discussed.  Advanced  directives, concepts specific to code status, artifical feeding and hydration, and rehospitalization were considered and discussed.  Brooke Gutierrez endorses DNR.  She states that she made this choice quite sometime ago.  Her family present at bedside states that they can abide her wish. We talk about preferred place of death.  Brooke Gutierrez quickly states that her preferred place of death would be her home.  I shared that sometimes people who are in her condition she is to not return to the hospital, sharing that she may not be able to leave if she were too sick.  Hospice and Palliative Care services outpatient were explained and offered.  Brooke Gutierrez is active with hospice of [facility] for approximately 1 to 2 weeks.  They plan to return home with the benefits of hospice care.  Discussed the importance of continued conversation with family and the medical providers regarding overall plan of care and treatment options, ensuring decisions are within the context of the patient's values and GOCs.  Questions and concerns were addressed.  The patient and family were encouraged to call with questions or concerns.  PMT will continue to support holistically.  Conference with attending, bedside nursing staff, transition of care team  related to patient condition, needs, goals of care, disposition.    HCPOA NEXT OF KIN -Brooke Gutierrez states that she would like for her family members to make choices as a team.  Katha Cabal is Research scientist (medical).    SUMMARY OF RECOMMENDATIONS   At this point continue to treat the treatable, but no CPR or intubation  Time for outcomes Return home with the benefits of "treat the treatable" hospice care Had been working with hospice of [facility] for Pleurx drain placement   Code Status/Advance Care Planning: DNR  Symptom Management:  Per hospitalist, no additional needs at this time.  Palliative Prophylaxis:  Frequent Pain Assessment and Oral  Care  Additional Recommendations (Limitations, Scope, Preferences): Treat the treatable hospice care  Psycho-social/Spiritual:  Desire for further Chaplaincy support:no Additional Recommendations: Caregiving  Support/Resources and Education on Hospice  Prognosis:  < 3 months, or less would not be surprising based on decreasing functional status, chronic illness burden.  Discharge Planning: Home with Hospice      Primary Diagnoses: Present on Admission:  Acute metabolic encephalopathy  Acute respiratory failure with hypoxia (HCC)  Hypoalbuminemia  Liver cirrhosis secondary to NASH (nonalcoholic steatohepatitis) ([place])   I have reviewed the medical record, interviewed the patient and family, and examined the patient. The following aspects are pertinent.  Past Medical History:  Diagnosis Date   CHF (congestive heart failure) ([place])    ?normal ECHO 02/2021   Cirrhosis (HCC)    COPD (chronic obstructive pulmonary disease) (HCC)    Diabetes mellitus without complication (HCC)    HLD (hyperlipidemia)    Social History   Socioeconomic History   Marital status: Married    Spouse name: Not on file   Number of children: Not on file   Years of education: Not on file   Highest education level: Not on file  Occupational History   Not on file  Tobacco Use   Smoking status: Never   Smokeless tobacco: Never  Vaping Use   Vaping Use: Never used  Substance and Sexual Activity   Alcohol use: Never   Drug use: Never   Sexual activity: Not Currently  Other Topics Concern   Not on file  Social History Narrative   Not on file   Social Determinants of Health   Financial Resource Strain: Not on file  Food Insecurity: No Food Insecurity (01/07/2022)   Hunger Vital Sign    Worried About Running Out of Food in the Last Year: Never true    Ran Out of Food in the Last Year: Never true  Transportation Needs: No Transportation Needs (01/07/2022)   PRAPARE - Radiographer, therapeutic (Medical): No    Lack of Transportation (Non-Medical): No  Physical Activity: Not on file  Stress: Not on file  Social Connections: Not on file   History reviewed. No pertinent family history. Scheduled Meds:  Chlorhexidine Gluconate Cloth  6 each Topical Daily   lactulose  30 g Oral TID   pravastatin  20 mg Oral QPM   Continuous Infusions:  albumin human 25 g (01/08/22 1033)   meropenem (MERREM) IV 1 g (01/08/22 0914)   PRN Meds:.acetaminophen **OR** acetaminophen, albuterol, ondansetron **OR** ondansetron (ZOFRAN) IV, mouth rinse, oxyCODONE Medications Prior to Admission:  Prior to Admission medications   Medication Sig Start Date End Date Taking? Authorizing Provider  insulin glargine (LANTUS) 100 UNIT/ML injection Inject 5-15 Units into the skin at bedtime. Sliding scale   Yes [provider]  insulin lispro (HUMALOG) 100 UNIT/ML injection Inject 10 Units into the skin 3 (three) times daily with meals. Sliding scale   Yes [provider]  Magnesium Oxide 400 MG CAPS Take 1 capsule (400 mg total) by mouth in the morning and at bedtime. 12/09/21  Yes Mahon, Lenise Arena, NP  pravastatin (PRAVACHOL) 20 MG tablet Take 20 mg by mouth every evening.   Yes [provider]  spironolactone (ALDACTONE) 25 MG tablet Take 3 tablets (75 mg total) by mouth daily. 11/26/21 11/26/22 Yes Mahon, Lenise Arena, NP  potassium chloride (KLOR-CON) 10 MEQ tablet Take 1 tablet (10 mEq total) by mouth daily. Take While taking Lasix/furosemide Patient not taking: Reported on 01/07/2022 03/22/21   Roxan Hockey, MD   Allergies  Allergen Reactions   Ciprofloxacin    Levaquin [Levofloxacin]    Sulfa Antibiotics    Other Rash    Adhesive tape   Review of Systems  Unable to perform ROS: Age    Physical Exam Vitals and nursing note reviewed.  Constitutional:      General: She is not in acute distress.    Appearance: She is ill-appearing.  HENT:     Mouth/Throat:      Mouth: Mucous membranes are moist.  Cardiovascular:     Rate and Rhythm: Normal rate.  Pulmonary:     Effort: Pulmonary effort is normal. No respiratory distress.  Abdominal:     General: There is distension.  Skin:    General: Skin is warm and dry.  Neurological:     Mental Status: She is alert and oriented to person, place, and time.  Psychiatric:        Mood and Affect: Mood normal.        Behavior: Behavior normal.     Vital Signs: BP (!) 107/47 (BP Location: Right Arm)   Pulse 95   Temp 98.9 F (37.2 C)   Resp 16   Ht '5\' 3"'$  (1.6 m)   Wt 63 kg   SpO2 95%   BMI 24.60 kg/m  Pain Scale: 0-10   Pain Score: Asleep   SpO2: SpO2: 95 % O2 Device:SpO2: 95 % O2 Flow Rate: .O2 Flow Rate (L/min): 2 L/min  IO: Intake/output summary:  Intake/Output Summary (Last 24 hours) at 01/08/2022 1401 Last data filed at 01/08/2022 0813 Gross per 24 hour  Intake 2176.28 ml  Output --  Net 2176.28 ml    LBM: Last BM Date : 01/08/22 Baseline Weight: Weight: 63 kg Most recent weight: Weight: 63 kg     Palliative Assessment/Data:   Flowsheet Rows    Flowsheet Row Most Recent Value  Intake Tab   Referral Department Hospitalist  Unit at Time of Referral Cardiac/Telemetry Unit  Palliative Care Primary Diagnosis Sepsis/Infectious Disease  Date Notified 01/07/22  Palliative Care Type New Palliative care  Reason for referral Clarify Goals of Care  Date of Admission 12/12/2021  Date first seen by Palliative Care 01/07/22  # of days Palliative referral response time 0 Day(s)  # of days IP prior to Palliative referral 1  Clinical Assessment   Palliative Performance Scale Score 20%  Pain Max last 24 hours Not able to report  Pain Min Last 24 hours Not able to report  Dyspnea Max Last 24 Hours Not able to report  Dyspnea Min Last 24 hours Not able to report  Psychosocial & Spiritual Assessment   Palliative Care Outcomes        Time In: 0830 Time Out: 0945 Time  Total: 75  minutes  Greater than 50%  of this time was spent counseling and coordinating care related to the above assessment and plan.  Signed by: Drue Novel, NP   Please contact Palliative Medicine Team phone at [phone] for questions and concerns.  For individual provider: See Shea Evans

## 2022-01-08 NOTE — Progress Notes (Signed)
Patient tolerated right sided paracentesis procedure well today and 2 Liters of clear yellow ascites removed with labs collected and sent for processing. PT verbalized understanding of post procedure instructions and report given to ICU nurse assigned.

## 2022-01-08 DEATH — deceased

## 2022-01-09 DIAGNOSIS — K7581 Nonalcoholic steatohepatitis (NASH): Secondary | ICD-10-CM | POA: Diagnosis not present

## 2022-01-09 DIAGNOSIS — E7220 Disorder of urea cycle metabolism, unspecified: Secondary | ICD-10-CM | POA: Diagnosis not present

## 2022-01-09 DIAGNOSIS — N179 Acute kidney failure, unspecified: Secondary | ICD-10-CM | POA: Diagnosis not present

## 2022-01-09 DIAGNOSIS — G9341 Metabolic encephalopathy: Secondary | ICD-10-CM | POA: Diagnosis not present

## 2022-01-09 DIAGNOSIS — K7682 Hepatic encephalopathy: Secondary | ICD-10-CM | POA: Diagnosis not present

## 2022-01-09 DIAGNOSIS — Z515 Encounter for palliative care: Secondary | ICD-10-CM | POA: Diagnosis not present

## 2022-01-09 LAB — COMPREHENSIVE METABOLIC PANEL
ALT: 11 U/L (ref 0–44)
AST: 22 U/L (ref 15–41)
Albumin: 4.1 g/dL (ref 3.5–5.0)
Alkaline Phosphatase: 47 U/L (ref 38–126)
Anion gap: 10 (ref 5–15)
BUN: 37 mg/dL — ABNORMAL HIGH (ref 8–23)
CO2: 25 mmol/L (ref 22–32)
Calcium: 9.3 mg/dL (ref 8.9–10.3)
Chloride: 104 mmol/L (ref 98–111)
Creatinine, Ser: 0.94 mg/dL (ref 0.44–1.00)
GFR, Estimated: 60 mL/min — ABNORMAL LOW (ref >60)
Glucose, Bld: 145 mg/dL — ABNORMAL HIGH (ref 70–99)
Potassium: 3.3 mmol/L — ABNORMAL LOW (ref 3.5–5.1)
Sodium: 139 mmol/L (ref 135–145)
Total Bilirubin: 6.1 mg/dL — ABNORMAL HIGH (ref 0.3–1.2)
Total Protein: 5.5 g/dL — ABNORMAL LOW (ref 6.5–8.1)

## 2022-01-09 LAB — URINE CULTURE: Culture: >=100000 — AB

## 2022-01-09 LAB — GLUCOSE, CAPILLARY
Glucose-Capillary: 152 mg/dL — ABNORMAL HIGH (ref 70–99)
Glucose-Capillary: 177 mg/dL — ABNORMAL HIGH (ref 70–99)

## 2022-01-09 LAB — PROTIME-INR
INR: 2.3 — ABNORMAL HIGH (ref 0.8–1.2)
Prothrombin Time: 24.7 seconds — ABNORMAL HIGH (ref 11.4–15.2)

## 2022-01-09 LAB — AMMONIA: Ammonia: 37 umol/L — ABNORMAL HIGH (ref 9–35)

## 2022-01-09 MED ORDER — LORAZEPAM 2 MG/ML IJ SOLN
INTRAMUSCULAR | Status: AC
  Filled 2022-01-09: qty 1

## 2022-01-09 MED ORDER — MORPHINE SULFATE (PF) 2 MG/ML IV SOLN
2.0000 mg | INTRAVENOUS | Status: DC | PRN
  Administered 2022-01-09 – 2022-01-10 (×3): 2 mg via INTRAVENOUS
  Filled 2022-01-09 (×2): qty 1

## 2022-01-09 MED ORDER — MORPHINE SULFATE (PF) 2 MG/ML IV SOLN
INTRAVENOUS | Status: AC
  Filled 2022-01-09: qty 1

## 2022-01-09 MED ORDER — LORAZEPAM 2 MG/ML IJ SOLN
1.0000 mg | INTRAMUSCULAR | Status: DC | PRN
  Administered 2022-01-09 – 2022-01-10 (×2): 1 mg via INTRAVENOUS
  Filled 2022-01-09: qty 1

## 2022-01-09 NOTE — TOC Transition Note (Signed)
Transition of Care [facility]) - CM/SW Discharge Note   Patient Details  Name: Brooke Gutierrez MRN: [id number] Date of Birth: January 05, 1936  Transition of Care [facility]) CM/SW Contact:  Shade Flood, LCSW Phone Number: 01/09/2022, 1:45 PM   Clinical Narrative:     Pt medically stable for dc home with resumption of hospice care at home per MD. Pt/family indicating that they have need for hospital bed at home. TOC updated Keka at [facility]. Hospice team spoke with family and requested DME delivery from [place].  TOC spoke with pt's daughter who states that the DME is supposed to be delivered between 3-6pm. Plan is for daughter to update pt's RN once bed delivered and then RN will call EMS. Transport form printed to the floor.  There are no other TOC needs for dc.  Final next level of care: Home w Hospice Care Barriers to Discharge: Barriers Resolved   Patient Goals and CMS Choice Patient states their goals for this hospitalization and ongoing recovery are:: return home   Choice offered to / list presented to : Adult Children  Discharge Placement                       Discharge Plan and Services In-house Referral: Clinical Social Work   Post Acute Care Choice: Resumption of Svcs/PTA Provider                      [facility] Agency: [facility]        Social Determinants of Health (SDOH) Interventions     Readmission Risk Interventions    01/07/2022   10:51 AM  Readmission Risk Prevention Plan  Transportation Screening Complete  HRI or [place] Complete  Social Work Consult for [place] Planning/Counseling Complete  Medication Review Press photographer) Complete

## 2022-01-09 NOTE — Care Management Important Message (Signed)
Important Message  Patient Details  Name: Brooke Gutierrez MRN: [id number] Date of Birth: Mar 20, 1935   Medicare Important Message Given:  N/A - LOS <3 / Initial given by admissions     Tommy Medal 01/09/2022, 12:09 PM

## 2022-01-09 NOTE — Progress Notes (Signed)
   01/09/22 1425  Assess: MEWS Score  Temp 97.7 F (36.5 C)  BP 103/60  MAP (mmHg) 74  Pulse Rate (!) 124  Resp 18  SpO2 97 %  Assess: MEWS Score  MEWS Temp 0  MEWS Systolic 0  MEWS Pulse 2  MEWS RR 0  MEWS LOC 0  MEWS Score 2  MEWS Score Color Yellow  Assess: if the MEWS score is Yellow or Red  Were vital signs taken at a resting state? Yes  Focused Assessment No change from prior assessment  Does the patient meet 2 or more of the SIRS criteria? No  MEWS guidelines implemented *See Row Information* Yes  Escalate  MEWS: Escalate Yellow: discuss with charge nurse/RN and consider discussing with provider and RRT  Notify: Charge Nurse/RN  Name of Charge Nurse/RN Notified Engineer, materials  Date Charge Nurse/RN Notified 01/09/22  Time Charge Nurse/RN Notified 1503  Assess: SIRS CRITERIA  SIRS Temperature  0  SIRS Pulse 1  SIRS Respirations  0  SIRS WBC 1  SIRS Score Sum  2

## 2022-01-09 NOTE — Progress Notes (Signed)
Palliative: Brooke Gutierrez is sitting up quietly in bed.  She greets me, making and somewhat keeping eye contact.  She is alert and oriented, able to make her basic needs known.  She is surrounded by her loving family including her husband and her 2 daughters.    Brooke Gutierrez tells me that she is to discharge home today with hospice care through [facility].  She states that they have chosen to not place a Pleurx catheter.  She states that she is tired of being prodded, poked, and transferred around.  I shared that hospice will take great care of her and her family.  We talked about symptom management, and I encouraged patient and family to lean on hospice.  Conference with attending, bedside nursing staff, transition of care team related to patient condition, needs, goals of care, disposition.  Plan: Home with the benefits of hospice of [facility] already in place.  DNR/goldenrod form signed and placed on chart.  Anticipate do not rehospitalize as she has shared her preferred place of death at home.  18 minutes Brooke Axe, NP Palliative medicine team Team phone [phone] Greater than 50% of this time was spent counseling and coordinating care related to the above assessment and plan.

## 2022-01-09 NOTE — Discharge Summary (Signed)
Physician Discharge Summary   Patient: Brooke Gutierrez MRN: [id number] DOB: April 04, 1935  Admit date:     12/09/2021  Discharge date: 01/09/22  Discharge Physician: Shanon Brow Murrel Freet   PCP: Neale Burly, MD   Recommendations at discharge:  Discharge home with hospice with focus on full comfort measures [facility] was already involved in her care and will resume services    Hospital Course: 86 y.o. female with medical history significant of CHF, NASH cirrhosis, COPD, diabetes mellitus type 2, hyperlipidemia, Hodgkin's disease (2008) presents the ED with a chief complaint of altered mental status.  Patient is not able to provide any history.  EMS reported the patient was sent from home for evaluation of altered mental status.    Patient's husband had reported the patient was confused and disoriented.  Review patient says she does not know why she is here initially.   Patient was alert and oriented x1 initially.  Initial labs showed ammonia 136 with pyuria.  Her sodium was 131 with a serum creatinine 1.34.  The patient was admitted for acute metabolic encephalopathy that was multifactorial.  She was started on lactulose with improvement of her sensorium.  During her hospital stay, the patient had soft blood pressures.  As result, her spironolactone and furosemide were discontinued.  Palliative medicine was consulted to assist with management.  Notably, the patient is ready being followed by hospice at home.  Paracentesis was requested, but fluid removal was limited secondary to the patient's soft blood pressures and hypotension.  Assessment and Plan: Acute metabolic encephalopathy -Multifactorial including hepatic encephalopathy, AKI, UTI, hyponatremia -Sensorium improving with lactulose -01/08/2022--mental status continues to improve 01/09/22-mental status improved and back to baseline   Decompensated liver cirrhosis -Unfortunately, fluid removal by paracentesis is limited secondary to  the patient's soft blood pressure and hypotension -Holding furosemide and spironolactone (would likely result in more rapid reaccumulation of fluid) -Low-sodium diet -01/08/2022 paracentesis--2L   Hepatic encephalopathy -Presented with ammonia 136>>104>>37 -lactulose started -After family meeting on 01/09/2022 where goals of care were discussed once again, the patient understood her poor prognosis in the setting of recurrent ascites and decompensated liver cirrhosis.  She made informed decision to go home with a focus on comfort measures care only.    Acute kidney injury Baseline creatinine 0.7-0.9 -Presented with serum creatinine 1.34 -Secondary to third spacing -Patient was given 1 L fluid bolus in the ED -Give albumin numerous doses -Judicious IV fluid -Serum creatinine 0.94 on the day of discharge   UTI -UA 21-50 WBC -Continue empiric ceftriaxone>>merram -Patient received 3 days of intravenous antibiotics prior to discharge After family meeting on 01/09/2022 where goals of care were discussed once again, the patient understood her poor prognosis in the setting of recurrent ascites and decompensated liver cirrhosis.  She made informed decision to go home with a focus on comfort measures care only.    Lactic acidosis -Secondary to decreased hepatic clearance -Sepsis ruled out   Controlled diabetes mellitus type 2 -Allow for liberal glycemic control at this point -12/05/2021 hemoglobin A1c 4.9 -Discontinue NovoLog sliding scale -Plan to discharge home with comfort care measures   COPD -Stable presently   Chronic respiratory failure with hypoxia -Patient uses 2 L nasal cannula as needed   Hypomagnesemia -Repleted   Hyponatremia -Secondary to liver cirrhosis -Overall stable and improving         Consultants: GI, palliative Procedures performed: paracentesis as above  Disposition: Home Diet recommendation:  Regular diet DISCHARGE MEDICATION: Allergies as of  01/09/2022       Reactions   Ciprofloxacin    Levaquin [levofloxacin]    Sulfa Antibiotics    Other Rash   Adhesive tape        Medication List     STOP taking these medications    insulin glargine 100 UNIT/ML injection Commonly known as: LANTUS   insulin lispro 100 UNIT/ML injection Commonly known as: HUMALOG   Magnesium Oxide 400 MG Caps   potassium chloride 10 MEQ tablet Commonly known as: KLOR-CON   pravastatin 20 MG tablet Commonly known as: PRAVACHOL   spironolactone 25 MG tablet Commonly known as: Aldactone        Discharge Exam: Filed Weights   01/07/2022 2254  Weight: 63 kg   HEENT:  [place]/AT, No thrush, no icterus CV:  RRR, no rub, no S3, no S4 Lung:  bibasilar crackles. No wheeze Abd:  soft/+BS, NT Ext:  trace LE edema, no lymphangitis, no synovitis, no rash   Condition at discharge: stable  The results of significant diagnostics from this hospitalization (including imaging, microbiology, ancillary and laboratory) are listed below for reference.   Imaging Studies: US Paracentesis  Result Date: 01/08/2022 INDICATION: Recurrent ascites, therapeutic paracentesis EXAM: ULTRASOUND GUIDED RLQ PARACENTESIS MEDICATIONS: None. COMPLICATIONS: None immediate. PROCEDURE: Informed written consent was obtained from the patient after a discussion of the risks, benefits and alternatives to treatment. A timeout was performed prior to the initiation of the procedure. Initial ultrasound scanning demonstrates a large amount of ascites within the right lower abdominal quadrant. The right lower abdomen was prepped and draped in the usual sterile fashion. 1% lidocaine was used for local anesthesia. Following this, a 19 gauge, 7-cm, Yueh catheter was introduced. An ultrasound image was saved for documentation purposes. The paracentesis was performed. The catheter was removed and a dressing was applied. The patient tolerated the procedure well without immediate post procedural  complication. FINDINGS: A total of approximately 2L of ascitic fluid was removed. Samples were sent to the laboratory as requested by the clinical team. IMPRESSION: Successful ultrasound-guided paracentesis yielding 2 liters of peritoneal fluid. Additional ascites remain. Due to blood pressure, opted for conservative volume removal. Can readdress and remove additional fluid as needed on another day. Read and performed by: Alexandria Lodge, PA-C Electronically Signed   By: Lavonia Dana M.D.   On: 01/08/2022 11:10   [place] ASCITES (ABDOMEN LIMITED)  Result Date: 01/07/2022 CLINICAL DATA:  Cirrhosis, ascites EXAM: LIMITED ABDOMEN ULTRASOUND FOR ASCITES TECHNIQUE: Limited ultrasound survey for ascites was performed in all four abdominal quadrants. COMPARISON:  01/01/2022 FINDINGS: Significant ascites present in the abdomen. Volume of ascites is sufficient for paracentesis. Initially, paracentesis was delayed due to hypotension. With improvement in blood pressure, attempted paracentesis was then considered but patient declined procedure, preferring to wait until tomorrow. IMPRESSION: Sufficient ascites for paracentesis. Electronically Signed   By: Lavonia Dana M.D.   On: 01/07/2022 15:07   CT Head Wo Contrast  Result Date: 12/16/2021 CLINICAL DATA:  Mental status change, unknown cause EXAM: CT HEAD WITHOUT CONTRAST TECHNIQUE: Contiguous axial images were obtained from the base of the skull through the vertex without intravenous contrast. RADIATION DOSE REDUCTION: This exam was performed according to the departmental dose-optimization program which includes automated exposure control, adjustment of the mA and/or kV according to patient size and/or use of iterative reconstruction technique. COMPARISON:  None Available. FINDINGS: Brain: No acute intracranial abnormality. Specifically, no hemorrhage, hydrocephalus, mass lesion, acute infarction, or significant intracranial injury. Vascular: No hyperdense  vessel or  unexpected calcification. Skull: No acute calvarial abnormality. Sinuses/Orbits: No acute findings Other: None IMPRESSION: No acute intracranial abnormality. Electronically Signed   By: Rolm Baptise M.D.   On: 01/05/2022 23:54   DG Chest Port 1 View  Result Date: 12/20/2021 CLINICAL DATA:  Questionable sepsis - evaluate for abnormality EXAM: PORTABLE CHEST 1 VIEW COMPARISON:  Chest radiograph 10/31/2021, CT 09/09/2021 FINDINGS: Right chest port remains in place. Lung volumes are low. The heart is normal in size. Mild bibasilar atelectasis without confluent airspace disease. No pneumothorax or large pleural effusion. No acute osseous findings. IMPRESSION: Low lung volumes with mild bibasilar atelectasis. Electronically Signed   By: Keith Rake M.D.   On: 12/17/2021 23:54   US Paracentesis  Result Date: 01/01/2022 INDICATION: Cirrhosis; recurrent ascites EXAM: ULTRASOUND GUIDED LLQ PARACENTESIS MEDICATIONS: 10 cc 1% lidocaine COMPLICATIONS: None immediate. PROCEDURE: Informed written consent was obtained from the patient after a discussion of the risks, benefits and alternatives to treatment. A timeout was performed prior to the initiation of the procedure. Initial ultrasound scanning demonstrates a large amount of ascites within the left lower abdominal quadrant. The left lower abdomen was prepped and draped in the usual sterile fashion. 1% lidocaine was used for local anesthesia. Following this, a Yueh catheter was introduced. An ultrasound image was saved for documentation purposes. The paracentesis was performed. The catheter was removed and a dressing was applied. The patient tolerated the procedure well without immediate post procedural complication. Patient received post-procedure intravenous albumin; see nursing notes for details. FINDINGS: A total of approximately 2.6 liters of yellow fluid was removed. Samples were sent to the laboratory as requested by the clinical team. IMPRESSION:  Successful ultrasound-guided paracentesis yielding 2.6 liters of peritoneal fluid. The patient has previously been formally evaluated by the [facility] and is being actively followed for potential future intervention. Patient states Hospice has evaluated her and is now transitioning to their service. She says they have discussed possible PleurX catheter placement in near future. Read by Lavonia Drafts [facility] Electronically Signed   By: Lavonia Dana M.D.   On: 01/01/2022 14:01   US Paracentesis  Result Date: 12/26/2021 INDICATION: Cirrhosis; recurrent ascites EXAM: ULTRASOUND GUIDED LLQ PARACENTESIS MEDICATIONS: 10 cc 1% lidocaine. COMPLICATIONS: None immediate. PROCEDURE: Informed written consent was obtained from the patient after a discussion of the risks, benefits and alternatives to treatment. A timeout was performed prior to the initiation of the procedure. Initial ultrasound scanning demonstrates a large amount of ascites within the left lower abdominal quadrant. The left lower abdomen was prepped and draped in the usual sterile fashion. 1% lidocaine was used for local anesthesia. Following this, a Yueh catheter was introduced. An ultrasound image was saved for documentation purposes. The paracentesis was performed. The catheter was removed and a dressing was applied. The patient tolerated the procedure well without immediate post procedural complication. Patient received post-procedure intravenous albumin; see nursing notes for details. FINDINGS: A total of approximately 5.6 liters of dark yellow fluid was removed. Samples were sent to the laboratory as requested by the clinical team. IMPRESSION: Successful ultrasound-guided paracentesis yielding 5.6 liters of peritoneal fluid. PLAN: The patient has previously been formally evaluated by the [facility] and is being actively followed for potential future  intervention. Patient states Hospice has evaluated her and is now transitioning to their service. She says they have discussed possible PleurX catheter placement in near future. Read by Lavonia Drafts [facility] Electronically  Signed   By: San Morelle M.D.   On: 12/26/2021 14:56   US Paracentesis  Result Date: 12/19/2021 INDICATION: Cirrhosis, ascites EXAM: ULTRASOUND GUIDED DIAGNOSTIC AND THERAPEUTIC PARACENTESIS MEDICATIONS: None. COMPLICATIONS: None immediate. PROCEDURE: Informed written consent was obtained from the patient after a discussion of the risks, benefits and alternatives to treatment. A timeout was performed prior to the initiation of the procedure. Initial ultrasound scanning demonstrates a large amount of ascites within the right lower abdominal quadrant. The right lower abdomen was prepped and draped in the usual sterile fashion. 1% lidocaine was used for local anesthesia. Following this, a 19 gauge, 7-cm, Yueh catheter was introduced. An ultrasound image was saved for documentation purposes. The paracentesis was performed. The catheter was removed and a dressing was applied. The patient tolerated the procedure well without immediate post procedural complication. Patient received post-procedure intravenous albumin; see nursing notes for details. FINDINGS: A total of approximately 4.3 L of yellow ascitic fluid was removed. Samples were sent to the laboratory as requested by the clinical team. IMPRESSION: Successful ultrasound-guided paracentesis yielding 4.3 liters of peritoneal fluid. Electronically Signed   By: Lavonia Dana M.D.   On: 12/19/2021 14:34   US Paracentesis  Result Date: 12/11/2021 INDICATION: Cirrhosis, ascites EXAM: ULTRASOUND GUIDED DIAGNOSTIC AND THERAPEUTIC PARACENTESIS MEDICATIONS: None. COMPLICATIONS: None immediate. PROCEDURE: Informed written consent was obtained from the patient after a discussion of the risks, benefits and alternatives to treatment. A timeout was  performed prior to the initiation of the procedure. Initial ultrasound scanning demonstrates a large amount of ascites within the right lower abdominal quadrant. The right lower abdomen was prepped and draped in the usual sterile fashion. 1% lidocaine was used for local anesthesia. Following this, a 19 gauge, 7-cm, Yueh catheter was introduced. An ultrasound image was saved for documentation purposes. The paracentesis was performed. The catheter was removed and a dressing was applied. The patient tolerated the procedure well without immediate post procedural complication. FINDINGS: A total of approximately 4.2 L of yellow ascitic fluid was removed. Samples were sent to the laboratory as requested by the clinical team. IMPRESSION: Successful ultrasound-guided paracentesis yielding 4.2 liters of peritoneal fluid. Electronically Signed   By: Lavonia Dana M.D.   On: 12/11/2021 14:36    Microbiology: Results for orders placed or performed during the hospital encounter of 12/19/2021  Blood Culture (routine x 2)     Status: None (Preliminary result)   Collection Time: 12/08/2021 11:36 PM   Specimen: BLOOD LEFT ARM  Result Value Ref Range Status   Specimen Description Peripheral BLOOD LEFT ARM  Final   Special Requests   Final    BOTTLES DRAWN AEROBIC AND ANAEROBIC Blood Culture results may not be optimal due to an excessive volume of blood received in culture bottles   Culture   Final    NO GROWTH 3 DAYS Performed at [facility], [address], [place], [place] [address]    Report Status PENDING  Incomplete  Blood Culture (routine x 2)     Status: None (Preliminary result)   Collection Time: 01/03/2022 11:36 PM   Specimen: BLOOD LEFT HAND  Result Value Ref Range Status   Specimen Description Peripheral BLOOD LEFT HAND  Final   Special Requests   Final    BOTTLES DRAWN AEROBIC AND ANAEROBIC Blood Culture adequate volume   Culture   Final    NO GROWTH 3 DAYS Performed at [facility], [address],  [place], [place] [address]    Report Status  PENDING  Incomplete  Urine Culture     Status: Abnormal   Collection Time: 01/07/22  1:26 AM   Specimen: Urine, Clean Catch  Result Value Ref Range Status   Specimen Description   Final    URINE, CLEAN CATCH Performed at [facility], [address] [place] [address], [place], [place] [address]    Special Requests   Final    NONE Performed at [facility], [address], [place], [place] [address]    Culture >=100,000 COLONIES/mL PROVIDENCIA RETTGERI (A)  Final   Report Status 01/09/2022 FINAL  Final   Organism ID, Bacteria PROVIDENCIA RETTGERI (A)  Final      Susceptibility   Providencia rettgeri - MIC*    AMPICILLIN RESISTANT Resistant     CEFAZOLIN >=64 RESISTANT Resistant     CEFEPIME <=0.12 SENSITIVE Sensitive     CEFTRIAXONE <=0.25 SENSITIVE Sensitive     CIPROFLOXACIN <=0.25 SENSITIVE Sensitive     GENTAMICIN <=1 SENSITIVE Sensitive     IMIPENEM 1 SENSITIVE Sensitive     NITROFURANTOIN 128 RESISTANT Resistant     TRIMETH/SULFA <=20 SENSITIVE Sensitive     AMPICILLIN/SULBACTAM <=2 SENSITIVE Sensitive     PIP/TAZO <=4 SENSITIVE Sensitive     * >=100,000 COLONIES/mL PROVIDENCIA RETTGERI  MRSA Next Gen by PCR, Nasal     Status: None   Collection Time: 01/07/22  8:31 AM   Specimen: Nasal Mucosa; Nasal Swab  Result Value Ref Range Status   MRSA by PCR Next Gen NOT DETECTED NOT DETECTED Final    Comment: (NOTE) The GeneXpert MRSA Assay (FDA approved for NASAL specimens only), is one component of a comprehensive MRSA colonization surveillance program. It is not intended to diagnose MRSA infection nor to guide or monitor treatment for MRSA infections. Test performance is not FDA approved in patients less than 52 years old. Performed at [facility], [address] [place], [place] [address]   Culture, body fluid w Gram Stain-bottle     Status: None (Preliminary result)   Collection Time: 01/08/22  9:30 AM   Specimen: Peritoneal Washings  Result Value  Ref Range Status   Specimen Description PERITONEAL  Final   Special Requests NONE  Final   Culture   Final    NO GROWTH < 24 HOURS Performed at [facility], [address], [place], [place] [address]    Report Status PENDING  Incomplete  Gram stain     Status: None   Collection Time: 01/08/22  9:30 AM   Specimen: Peritoneal Washings  Result Value Ref Range Status   Specimen Description PERITONEAL  Final   Special Requests NONE  Final   Gram Stain   Final    NO ORGANISMS SEEN WBC PRESENT,BOTH PMN AND MONONUCLEAR CYTOSPIN SMEAR PERFORMED AT [facility] Performed at [facility], [address], [place], [place] [address]    Report Status 01/08/2022 FINAL  Final    Labs: CBC: Recent Labs  Lab 12/28/2021 2336 01/07/22 0434 01/09/22 0410  WBC 6.1 6.4 3.1*  NEUTROABS 5.0 5.0  --   HGB 10.3* 9.8* 7.4*  HCT 29.9* 29.0* 22.1*  MCV 98.4 101.0* 103.3*  PLT 97* 90* 69*   Basic Metabolic Panel: Recent Labs  Lab 12/12/2021 2336 01/07/22 0434 01/08/22 0325 01/09/22 0410  NA 131* 131* 136 139  K 3.6 3.9 3.6 3.3*  CL 96* 96* 103 104  CO2 '23 23 24 25  '$ GLUCOSE 132* 144* 124* 145*  BUN 34* 37* 36* 37*  CREATININE 1.34* 1.38* 1.10*  0.94  CALCIUM 8.8* 8.8* 8.7* 9.3  MG  --  1.4* 1.9  --    Liver Function Tests: Recent Labs  Lab 12/14/2021 2336 01/07/22 0434 01/08/22 0325 01/09/22 0410  AST 32 33 20 22  ALT '15 17 11 11  '$ ALKPHOS 128* 117 55 47  BILITOT 6.3* 6.5* 6.2* 6.1*  PROT 6.0* 5.7* 5.2* 5.5*  ALBUMIN 2.6* 2.6* 3.4* 4.1   CBG: Recent Labs  Lab 01/07/22 2116 01/08/22 0722 01/08/22 1139 01/09/22 0743 01/09/22 1118  GLUCAP 133* 113* 123* 152* 177*    Discharge time spent: greater than 30 minutes.  Signed: Orson Eva, MD [facility] 01/09/2022

## 2022-01-10 DIAGNOSIS — G9341 Metabolic encephalopathy: Secondary | ICD-10-CM | POA: Diagnosis not present

## 2022-01-10 DIAGNOSIS — J9601 Acute respiratory failure with hypoxia: Secondary | ICD-10-CM | POA: Diagnosis not present

## 2022-01-10 DIAGNOSIS — E7220 Disorder of urea cycle metabolism, unspecified: Secondary | ICD-10-CM | POA: Diagnosis not present

## 2022-01-10 DIAGNOSIS — K7581 Nonalcoholic steatohepatitis (NASH): Secondary | ICD-10-CM | POA: Diagnosis not present

## 2022-01-10 LAB — PATHOLOGIST SMEAR REVIEW

## 2022-01-11 LAB — CULTURE, BLOOD (ROUTINE X 2)
Culture: NO GROWTH
Culture: NO GROWTH
Special Requests: ADEQUATE

## 2022-01-13 LAB — CBC
HCT: 22.1 % — ABNORMAL LOW (ref 36.0–46.0)
Hemoglobin: 7.4 g/dL — ABNORMAL LOW (ref 12.0–15.0)
MCH: 34.6 pg — ABNORMAL HIGH (ref 26.0–34.0)
MCHC: 33.5 g/dL (ref 30.0–36.0)
MCV: 103.3 fL — ABNORMAL HIGH (ref 80.0–100.0)
Platelets: 69 10*3/uL — ABNORMAL LOW (ref 150–400)
RBC: 2.14 MIL/uL — ABNORMAL LOW (ref 3.87–5.11)
RDW: 18.7 % — ABNORMAL HIGH (ref 11.5–15.5)
WBC: 3.1 10*3/uL — ABNORMAL LOW (ref 4.0–10.5)
nRBC: 0 % (ref 0.0–0.2)

## 2022-01-13 LAB — CULTURE, BODY FLUID W GRAM STAIN -BOTTLE: Culture: NO GROWTH

## 2022-01-15 ENCOUNTER — Ambulatory Visit (HOSPITAL_COMMUNITY): Payer: Medicare Other

## 2022-02-07 NOTE — Progress Notes (Signed)
PROGRESS NOTE  Brooke Gutierrez QHU:[id number] DOB: 03/28/1935 DOA: 12/31/2021 PCP: Neale Burly, MD  Brief History:  86 y.o. female with medical history significant of CHF, NASH cirrhosis, COPD, diabetes mellitus type 2, hyperlipidemia, Hodgkin's disease (2008) presents the ED with a chief complaint of altered mental status.  Patient is not able to provide any history.  EMS reported the patient was sent from home for evaluation of altered mental status.    Patient's husband had reported the patient was confused and disoriented.  Review patient says she does not know why she is here initially.   Patient was alert and oriented x1 initially.  Initial labs showed ammonia 136 with pyuria.  Her sodium was 131 with a serum creatinine 1.34.  The patient was admitted for acute metabolic encephalopathy that was multifactorial.  She was started on lactulose with improvement of her sensorium.  During her hospital stay, the patient had soft blood pressures.  As result, her spironolactone and furosemide were discontinued.  Palliative medicine was consulted to assist with management.  Notably, the patient is ready being followed by hospice at home.  Paracentesis was requested, but fluid removal was limited secondary to the patient's soft blood pressures and hypotension.  On 01/08/2022, paracentesis was performed and 2 L were is only able to be removed secondary to the patient developing hypotension.  Palliative medicine continue to follow patient. I had numerous discussions with the patient and family, the patient's mental status returned back to baseline as her ammonia improved and the UTI was treated. After family meeting on 01/09/2022 where goals of care were discussed once again, the patient understood her poor prognosis in the setting of recurrent ascites and decompensated liver cirrhosis.  She made informed decision to go home with a focus on comfort measures care only.  TOC was consulted to assist in  transition home with hospice and the appropriate DME.   Assessment/Plan:  Acute metabolic encephalopathy -Multifactorial including hepatic encephalopathy, AKI, UTI, hyponatremia -Sensorium improving with lactulose -01/08/2022--mental status continues to improve 01/09/22-mental status improved and back to baseline -no longer active issue as patient's focus of care has been transitioned to focus on comfort measures only   Decompensated liver cirrhosis -Unfortunately, fluid removal by paracentesis is limited secondary to the patient's soft blood pressure and hypotension -Holding furosemide and spironolactone (would likely result in more rapid reaccumulation of fluid) -Low-sodium diet -01/08/2022 paracentesis--2L  -no longer active issue as patient's focus of care has been transitioned to focus on comfort measures only   Hepatic encephalopathy -Presented with ammonia 136>>104>>37 -lactulose started -After family meeting on 01/09/2022 where goals of care were discussed once again, the patient understood her poor prognosis in the setting of recurrent ascites and decompensated liver cirrhosis.  She made informed decision to go home with a focus on comfort measures care only.    Acute kidney injury Baseline creatinine 0.7-0.9 -Presented with serum creatinine 1.34 -Secondary to third spacing -Patient was given 1 L fluid bolus in the ED -Give albumin numerous doses -Judicious IV fluid -no longer active issue as patient's focus of care has been transitioned to focus on comfort measures only     UTI -UA 21-50 WBC -Continue empiric ceftriaxone>>merram -Patient received 3 days of intravenous antibiotics prior to discharge After family meeting on 01/09/2022 where goals of care were discussed once again, the patient understood her poor prognosis in the setting of recurrent ascites and decompensated liver cirrhosis.  She  made informed decision to go home with a focus on comfort measures care only.     Lactic acidosis -Secondary to decreased hepatic clearance -Sepsis ruled out   Controlled diabetes mellitus type 2 -Allow for liberal glycemic control at this point -12/05/2021 hemoglobin A1c 4.9 -Discontinue NovoLog sliding scale -Plan to discharge home with comfort care measures   COPD -Stable presently   Chronic respiratory failure with hypoxia -Patient uses 2 L nasal cannula as needed   Hypomagnesemia -Repleted   Hyponatremia -Secondary to liver cirrhosis -Overall stable and improving      Family Communication:   Family at bedside updated 11/3  Consultants:  palliative, GI  Code Status:  FULL COMFORT  DVT Prophylaxis:  FULL COMFORT   Procedures: As Listed in Progress Note Above  Antibiotics: None       Subjective: Patient is encephalopathic.  ROS not possible  Objective: Vitals:   01/09/22 1503 01/09/22 2104 01-22-2022 0112 22-Jan-2022 0930  BP:  (!) 119/95  (!) 74/48  Pulse: 99 (!) 50 62 (!) 133  Resp:  20  (!) 22  Temp:  98.3 F (36.8 C)  97.8 F (36.6 C)  TempSrc:  Oral  Axillary  SpO2:  (!) 78% 96% 95%  Weight:      Height:       No intake or output data in the 24 hours ending 2022/01/22 1747 Weight change:  Exam:  General:  Pt is alert, does not follow commands appropriately, not in acute distress HEENT: No icterus, No thrush, No neck mass, [place]/AT Cardiovascular: RRR, S1/S2, no rubs, no gallops Respiratory: bibasilar rales. No wheeze Abdomen: Soft/diffusely tender, non distended, no guarding Extremities: trace LE edema, No lymphangitis, No petechiae, No rashes, no synovitis   Data Reviewed: I have personally reviewed following labs and imaging studies Basic Metabolic Panel: Recent Labs  Lab 12/08/2021 2336 01/07/22 0434 01/08/22 0325 01/09/22 0410  NA 131* 131* 136 139  K 3.6 3.9 3.6 3.3*  CL 96* 96* 103 104  CO2 '23 23 24 25  '$ GLUCOSE 132* 144* 124* 145*  BUN 34* 37* 36* 37*  CREATININE 1.34* 1.38* 1.10* 0.94  CALCIUM 8.8* 8.8*  8.7* 9.3  MG  --  1.4* 1.9  --    Liver Function Tests: Recent Labs  Lab 01/05/2022 2336 01/07/22 0434 01/08/22 0325 01/09/22 0410  AST 32 33 20 22  ALT '15 17 11 11  '$ ALKPHOS 128* 117 55 47  BILITOT 6.3* 6.5* 6.2* 6.1*  PROT 6.0* 5.7* 5.2* 5.5*  ALBUMIN 2.6* 2.6* 3.4* 4.1   No results for input(s): "LIPASE", "AMYLASE" in the last 168 hours. Recent Labs  Lab 12/25/2021 2336 01/07/22 0434 01/08/22 0802 01/09/22 0410  AMMONIA 139* 136* 104* 37*   Coagulation Profile: Recent Labs  Lab 12/24/2021 2336 01/08/22 1331 01/09/22 0410  INR 1.5* 2.3* 2.3*   CBC: Recent Labs  Lab 12/11/2021 2336 01/07/22 0434 01/09/22 0410  WBC 6.1 6.4 3.1*  NEUTROABS 5.0 5.0  --   HGB 10.3* 9.8* 7.4*  HCT 29.9* 29.0* 22.1*  MCV 98.4 101.0* 103.3*  PLT 97* 90* 69*   Cardiac Enzymes: No results for input(s): "CKTOTAL", "CKMB", "CKMBINDEX", "TROPONINI" in the last 168 hours. BNP: Invalid input(s): "POCBNP" CBG: Recent Labs  Lab 01/07/22 2116 01/08/22 0722 01/08/22 1139 01/09/22 0743 01/09/22 1118  GLUCAP 133* 113* 123* 152* 177*   HbA1C: No results for input(s): "HGBA1C" in the last 72 hours. Urine analysis:    Component Value Date/Time   COLORURINE AMBER (  A) 01/07/2022 0126   APPEARANCEUR CLOUDY (A) 01/07/2022 0126   LABSPEC 1.017 01/07/2022 0126   PHURINE 8.0 01/07/2022 0126   GLUCOSEU NEGATIVE 01/07/2022 0126   HGBUR NEGATIVE 01/07/2022 0126   BILIRUBINUR NEGATIVE 01/07/2022 0126   KETONESUR NEGATIVE 01/07/2022 0126   PROTEINUR >=300 (A) 01/07/2022 0126   NITRITE NEGATIVE 01/07/2022 0126   LEUKOCYTESUR MODERATE (A) 01/07/2022 0126   Sepsis Labs: '@LABRCNTIP'$ (procalcitonin:4,lacticidven:4) ) Recent Results (from the past 240 hour(s))  Culture, body fluid w Gram Stain-bottle     Status: None   Collection Time: 01/01/22  1:16 PM   Specimen: Peritoneal Washings  Result Value Ref Range Status   Specimen Description PERITONEAL  Final   Special Requests BOTTLES DRAWN AEROBIC  AND ANAEROBIC 10CC  Final   Culture   Final    NO GROWTH 5 DAYS Performed at [facility], [address] [place], [place] [address]    Report Status 01/05/2022 FINAL  Final  Gram stain     Status: None   Collection Time: 01/01/22  1:16 PM   Specimen: Peritoneal Washings  Result Value Ref Range Status   Specimen Description PERITONEAL  Final   Special Requests NONE  Final   Gram Stain   Final    WBC PRESENT,BOTH PMN AND MONONUCLEAR NO ORGANISMS SEEN CYTOSPIN SMEAR Performed at [facility], [address] [place], [place] [address]    Report Status 01/01/2022 FINAL  Final  Blood Culture (routine x 2)     Status: None (Preliminary result)   Collection Time: 12/27/2021 11:36 PM   Specimen: BLOOD LEFT ARM  Result Value Ref Range Status   Specimen Description Peripheral BLOOD LEFT ARM  Final   Special Requests   Final    BOTTLES DRAWN AEROBIC AND ANAEROBIC Blood Culture results may not be optimal due to an excessive volume of blood received in culture bottles   Culture   Final    NO GROWTH 4 DAYS Performed at [facility], [address], [place], [place] [address]    Report Status PENDING  Incomplete  Blood Culture (routine x 2)     Status: None (Preliminary result)   Collection Time: 01/03/2022 11:36 PM   Specimen: BLOOD LEFT HAND  Result Value Ref Range Status   Specimen Description Peripheral BLOOD LEFT HAND  Final   Special Requests   Final    BOTTLES DRAWN AEROBIC AND ANAEROBIC Blood Culture adequate volume   Culture   Final    NO GROWTH 4 DAYS Performed at [facility], [address], [place], [place] [address]    Report Status PENDING  Incomplete  Urine Culture     Status: Abnormal   Collection Time: 01/07/22  1:26 AM   Specimen: Urine, Clean Catch  Result Value Ref Range Status   Specimen Description   Final    URINE, CLEAN CATCH Performed at [facility], [address], [place], [place] [address]    Special Requests   Final    NONE Performed at [facility], [address], [place], [place] [address]    Culture >=100,000 COLONIES/mL PROVIDENCIA RETTGERI (A)  Final   Report Status 01/09/2022 FINAL  Final   Organism ID, Bacteria PROVIDENCIA RETTGERI (A)  Final      Susceptibility   Providencia rettgeri - MIC*    AMPICILLIN RESISTANT Resistant     CEFAZOLIN >=64 RESISTANT Resistant     CEFEPIME <=0.12 SENSITIVE Sensitive     CEFTRIAXONE <=0.25 SENSITIVE Sensitive  CIPROFLOXACIN <=0.25 SENSITIVE Sensitive     GENTAMICIN <=1 SENSITIVE Sensitive     IMIPENEM 1 SENSITIVE Sensitive     NITROFURANTOIN 128 RESISTANT Resistant     TRIMETH/SULFA <=20 SENSITIVE Sensitive     AMPICILLIN/SULBACTAM <=2 SENSITIVE Sensitive     PIP/TAZO <=4 SENSITIVE Sensitive     * >=100,000 COLONIES/mL PROVIDENCIA RETTGERI  MRSA Next Gen by PCR, Nasal     Status: None   Collection Time: 01/07/22  8:31 AM   Specimen: Nasal Mucosa; Nasal Swab  Result Value Ref Range Status   MRSA by PCR Next Gen NOT DETECTED NOT DETECTED Final    Comment: (NOTE) The GeneXpert MRSA Assay (FDA approved for NASAL specimens only), is one component of a comprehensive MRSA colonization surveillance program. It is not intended to diagnose MRSA infection nor to guide or monitor treatment for MRSA infections. Test performance is not FDA approved in patients less than 35 years old. Performed at [facility], [address] [place], [place] [address]   Culture, body fluid w Gram Stain-bottle     Status: None (Preliminary result)   Collection Time: 01/08/22  9:30 AM   Specimen: Peritoneal Washings  Result Value Ref Range Status   Specimen Description PERITONEAL  Final   Special Requests NONE  Final   Culture   Final    NO GROWTH 2 DAYS Performed at [facility], [address] [place], [place] [address]    Report Status PENDING  Incomplete  Gram stain     Status: None   Collection Time: 01/08/22  9:30 AM   Specimen: Peritoneal Washings  Result Value Ref Range Status   Specimen  Description PERITONEAL  Final   Special Requests NONE  Final   Gram Stain   Final    NO ORGANISMS SEEN WBC PRESENT,BOTH PMN AND MONONUCLEAR CYTOSPIN SMEAR PERFORMED AT [facility] Performed at [facility], [address] [place], [place] [address]    Report Status 01/08/2022 FINAL  Final     Scheduled Meds:  Chlorhexidine Gluconate Cloth  6 each Topical Daily   lactulose  30 g Oral TID   pravastatin  20 mg Oral QPM   Continuous Infusions:  meropenem (MERREM) IV 1 g (2022/01/31 0832)    Procedures/Studies: US Paracentesis  Result Date: 01/08/2022 INDICATION: Recurrent ascites, therapeutic paracentesis EXAM: ULTRASOUND GUIDED RLQ PARACENTESIS MEDICATIONS: None. COMPLICATIONS: None immediate. PROCEDURE: Informed written consent was obtained from the patient after a discussion of the risks, benefits and alternatives to treatment. A timeout was performed prior to the initiation of the procedure. Initial ultrasound scanning demonstrates a large amount of ascites within the right lower abdominal quadrant. The right lower abdomen was prepped and draped in the usual sterile fashion. 1% lidocaine was used for local anesthesia. Following this, a 19 gauge, 7-cm, Yueh catheter was introduced. An ultrasound image was saved for documentation purposes. The paracentesis was performed. The catheter was removed and a dressing was applied. The patient tolerated the procedure well without immediate post procedural complication. FINDINGS: A total of approximately 2L of ascitic fluid was removed. Samples were sent to the laboratory as requested by the clinical team. IMPRESSION: Successful ultrasound-guided paracentesis yielding 2 liters of peritoneal fluid. Additional ascites remain. Due to blood pressure, opted for conservative volume removal. Can readdress and remove additional fluid as needed on another day. Read and performed by: Alexandria Lodge, PA-C Electronically Signed   By: Lavonia Dana M.D.   On: 01/08/2022  11:10   [place] ASCITES (ABDOMEN  LIMITED)  Result Date: 01/07/2022 CLINICAL DATA:  Cirrhosis, ascites EXAM: LIMITED ABDOMEN ULTRASOUND FOR ASCITES TECHNIQUE: Limited ultrasound survey for ascites was performed in all four abdominal quadrants. COMPARISON:  01/01/2022 FINDINGS: Significant ascites present in the abdomen. Volume of ascites is sufficient for paracentesis. Initially, paracentesis was delayed due to hypotension. With improvement in blood pressure, attempted paracentesis was then considered but patient declined procedure, preferring to wait until tomorrow. IMPRESSION: Sufficient ascites for paracentesis. Electronically Signed   By: Lavonia Dana M.D.   On: 01/07/2022 15:07   CT Head Wo Contrast  Result Date: 12/15/2021 CLINICAL DATA:  Mental status change, unknown cause EXAM: CT HEAD WITHOUT CONTRAST TECHNIQUE: Contiguous axial images were obtained from the base of the skull through the vertex without intravenous contrast. RADIATION DOSE REDUCTION: This exam was performed according to the departmental dose-optimization program which includes automated exposure control, adjustment of the mA and/or kV according to patient size and/or use of iterative reconstruction technique. COMPARISON:  None Available. FINDINGS: Brain: No acute intracranial abnormality. Specifically, no hemorrhage, hydrocephalus, mass lesion, acute infarction, or significant intracranial injury. Vascular: No hyperdense vessel or unexpected calcification. Skull: No acute calvarial abnormality. Sinuses/Orbits: No acute findings Other: None IMPRESSION: No acute intracranial abnormality. Electronically Signed   By: Rolm Baptise M.D.   On: 12/09/2021 23:54   DG Chest Port 1 View  Result Date: 12/16/2021 CLINICAL DATA:  Questionable sepsis - evaluate for abnormality EXAM: PORTABLE CHEST 1 VIEW COMPARISON:  Chest radiograph 10/31/2021, CT 09/09/2021 FINDINGS: Right chest port remains in place. Lung volumes are low. The heart is normal in  size. Mild bibasilar atelectasis without confluent airspace disease. No pneumothorax or large pleural effusion. No acute osseous findings. IMPRESSION: Low lung volumes with mild bibasilar atelectasis. Electronically Signed   By: Keith Rake M.D.   On: 01/05/2022 23:54   US Paracentesis  Result Date: 01/01/2022 INDICATION: Cirrhosis; recurrent ascites EXAM: ULTRASOUND GUIDED LLQ PARACENTESIS MEDICATIONS: 10 cc 1% lidocaine COMPLICATIONS: None immediate. PROCEDURE: Informed written consent was obtained from the patient after a discussion of the risks, benefits and alternatives to treatment. A timeout was performed prior to the initiation of the procedure. Initial ultrasound scanning demonstrates a large amount of ascites within the left lower abdominal quadrant. The left lower abdomen was prepped and draped in the usual sterile fashion. 1% lidocaine was used for local anesthesia. Following this, a Yueh catheter was introduced. An ultrasound image was saved for documentation purposes. The paracentesis was performed. The catheter was removed and a dressing was applied. The patient tolerated the procedure well without immediate post procedural complication. Patient received post-procedure intravenous albumin; see nursing notes for details. FINDINGS: A total of approximately 2.6 liters of yellow fluid was removed. Samples were sent to the laboratory as requested by the clinical team. IMPRESSION: Successful ultrasound-guided paracentesis yielding 2.6 liters of peritoneal fluid. The patient has previously been formally evaluated by the [facility] and is being actively followed for potential future intervention. Patient states Hospice has evaluated her and is now transitioning to their service. She says they have discussed possible PleurX catheter placement in near future. Read by Lavonia Drafts [facility] Electronically Signed   By: Lavonia Dana M.D.   On: 01/01/2022  14:01   US Paracentesis  Result Date: 12/26/2021 INDICATION: Cirrhosis; recurrent ascites EXAM: ULTRASOUND GUIDED LLQ PARACENTESIS MEDICATIONS: 10 cc 1% lidocaine. COMPLICATIONS: None immediate. PROCEDURE: Informed written consent was obtained from the patient after a discussion of the risks, benefits and  alternatives to treatment. A timeout was performed prior to the initiation of the procedure. Initial ultrasound scanning demonstrates a large amount of ascites within the left lower abdominal quadrant. The left lower abdomen was prepped and draped in the usual sterile fashion. 1% lidocaine was used for local anesthesia. Following this, a Yueh catheter was introduced. An ultrasound image was saved for documentation purposes. The paracentesis was performed. The catheter was removed and a dressing was applied. The patient tolerated the procedure well without immediate post procedural complication. Patient received post-procedure intravenous albumin; see nursing notes for details. FINDINGS: A total of approximately 5.6 liters of dark yellow fluid was removed. Samples were sent to the laboratory as requested by the clinical team. IMPRESSION: Successful ultrasound-guided paracentesis yielding 5.6 liters of peritoneal fluid. PLAN: The patient has previously been formally evaluated by the [facility] and is being actively followed for potential future intervention. Patient states Hospice has evaluated her and is now transitioning to their service. She says they have discussed possible PleurX catheter placement in near future. Read by Lavonia Drafts [facility] Electronically Signed   By: San Morelle M.D.   On: 12/26/2021 14:56   US Paracentesis  Result Date: 12/19/2021 INDICATION: Cirrhosis, ascites EXAM: ULTRASOUND GUIDED DIAGNOSTIC AND THERAPEUTIC PARACENTESIS MEDICATIONS: None. COMPLICATIONS: None immediate. PROCEDURE: Informed written consent was obtained  from the patient after a discussion of the risks, benefits and alternatives to treatment. A timeout was performed prior to the initiation of the procedure. Initial ultrasound scanning demonstrates a large amount of ascites within the right lower abdominal quadrant. The right lower abdomen was prepped and draped in the usual sterile fashion. 1% lidocaine was used for local anesthesia. Following this, a 19 gauge, 7-cm, Yueh catheter was introduced. An ultrasound image was saved for documentation purposes. The paracentesis was performed. The catheter was removed and a dressing was applied. The patient tolerated the procedure well without immediate post procedural complication. Patient received post-procedure intravenous albumin; see nursing notes for details. FINDINGS: A total of approximately 4.3 L of yellow ascitic fluid was removed. Samples were sent to the laboratory as requested by the clinical team. IMPRESSION: Successful ultrasound-guided paracentesis yielding 4.3 liters of peritoneal fluid. Electronically Signed   By: Lavonia Dana M.D.   On: 12/19/2021 14:34    Orson Eva, DO  [facility]  If 7PM-7AM, please contact night-coverage [email or web address] Password [email or web address] 01-20-2022, 5:47 PM   LOS: 3 days

## 2022-02-07 NOTE — TOC Progression Note (Signed)
Transition of Care [facility]) - Progression Note    Patient Details  Name: MAILANI DEGROOTE MRN: [id number] Date of Birth: 05-Feb-1936  Transition of Care [facility]) CM/SW Contact  Shade Flood, LCSW Phone Number: 01/20/22, 10:29 AM  Clinical Narrative:     TOC following. Late yesterday, family and [facility] determined pt would benefit from care at [facility]. Today, pt is too low to make a move per MD. Hospice team states pt does not qualify for GIP since she was already on hospice status at home prior to admission. MD anticipating in hospital death.   Expected Discharge Plan: Home w Hospice Care Barriers to Discharge: Other (must enter comment) (not stable for transfer to [facility])  Expected Discharge Plan and Services Expected Discharge Plan: [place] In-house Referral: Clinical Social Work   Post Acute Care Choice: Resumption of Svcs/PTA Provider Living arrangements for the past 2 months: Single Family Home Expected Discharge Date: 01/09/22                           [place] Determinants of Health (SDOH) Interventions    Readmission Risk Interventions    01/07/2022   10:51 AM  Readmission Risk Prevention Plan  Transportation Screening Complete  HRI or [place] Complete  Social Work Consult for [place] Planning/Counseling Complete  Medication Review Press photographer) Complete

## 2022-02-07 NOTE — Death Summary Note (Signed)
DEATH SUMMARY   Patient Details  Name: Brooke Gutierrez MRN: [id number] DOB: [birth year] CHY:IFOYDXA, Samul Dada, MD Admission/Discharge Information   Admit Date:  01-15-22  Date of Death: Date of Death: 01/19/22  Time of Death: Time of Death: May 30, 1834  Length of Stay: 3   Principle Cause of death: Decompensated Liver Cirrhosis    Hospital Course: 86 y.o. female with medical history significant of CHF, NASH cirrhosis, COPD, diabetes mellitus type 2, hyperlipidemia, Hodgkin's disease 30-May-2006) presents the ED with a chief complaint of altered mental status.  Patient is not able to provide any history.  EMS reported the patient was sent from home for evaluation of altered mental status.    Patient's husband had reported the patient was confused and disoriented.  Review patient says she does not know why she is here initially.   Patient was alert and oriented x1 initially.  Initial labs showed ammonia 136 with pyuria.  Her sodium was 131 with a serum creatinine 1.34.  The patient was admitted for acute metabolic encephalopathy that was multifactorial.  She was started on lactulose with improvement of her sensorium.  During her hospital stay, the patient had soft blood pressures.  As result, her spironolactone and furosemide were discontinued.  Palliative medicine was consulted to assist with management.  Notably, the patient is ready being followed by hospice at home.  Paracentesis was requested, but fluid removal was limited secondary to the patient's soft blood pressures and hypotension.  On 01/08/2022, paracentesis was performed and 2 L were is only able to be removed secondary to the patient developing hypotension.  Palliative medicine continue to follow patient. I had numerous discussions with the patient and family, the patient's mental status returned back to baseline as her ammonia improved and the UTI was treated. After family meeting on 01/09/2022 where goals of care were discussed once again, the  patient understood her poor prognosis in the setting of recurrent ascites and decompensated liver cirrhosis.  She made informed decision to go home with a focus on comfort measures care only.  TOC was consulted to assist in transition home with hospice and the appropriate DME.  On the evening 01/09/22, I was notified by patient's daughter that they felt they could properly care for the patient at home.  They requested for a place at residential hospice.  Unfortunately, the patient worsen symptomatically and comfort measures with IV opioids and benzodiazepines were started.  On 01-19-22, it was felt that the patient was not stable enough to transfer to [facility].  Therefore, comfort care measures were continued for the duration of her hospitalization.  Assessment and Plan: * Acute metabolic encephalopathy - Likely multifactorial - The biggest contributor is likely pneumonia 139 - UTI likely also contributing - Patient was hypoxic and required 2 L nasal cannula, hypoxia likely also contributing - CT head shows no acute findings - Chest x-ray shows low lung volumes - pH 7.48, PCO2 34 - Urine culture and blood culture pending - Continue to treat UTI with Rocephin, continue treat hyperammonemia with lactulose  Liver cirrhosis secondary to NASH (nonalcoholic steatohepatitis) (HCC) - Continue spironolactone  AKI (acute kidney injury) (HCC) - Creatinine at baseline 0.73 - Creatinine today 1.54 - With little history, it is hard to know and the main contributors - Hold nephrotoxic agents when possible - Trend creatinine in the a.m. - Patient was given 1 L bolus in the ED - Continue monitor  UTI (urinary tract infection) - UA indicative of UTI -  Urine culture and blood culture pending - Patient technically meets SIRS criteria but I think that has more to do with her hepatic encephalopathy than with her UTI - Lactic acid elevated, but has been downtrending - Continue to trend - Continue  monitor  Hyperammonemia (HCC) - Ammonia 139 - Continue lactulose 30 g daily - Ammonia holding stable at 136 - Continue to monitor  Acute respiratory failure with hypoxia (HCC) - Hypoxic at presentation - Chest x-ray shows low lung volumes - Patient did improve and become alert enough to answer some questions -Continue O2 supplementation -PRN albuterol -Continue to monitor  Hypoalbuminemia - Albumin is 2.6 - Likely liver cirrhosis and diet contributing - Encourage nutrient dense food choices when patient is able to tolerate diet - Continue to monitor  Type 2 diabetes mellitus (HCC) - Glucose well controlled at 132 - Sliding scale ordered - Continue to monitor         Procedures: paracentesis  Consultations: palliative med  The results of significant diagnostics from this hospitalization (including imaging, microbiology, ancillary and laboratory) are listed below for reference.   Significant Diagnostic Studies: US Paracentesis  Result Date: 01/08/2022 INDICATION: Recurrent ascites, therapeutic paracentesis EXAM: ULTRASOUND GUIDED RLQ PARACENTESIS MEDICATIONS: None. COMPLICATIONS: None immediate. PROCEDURE: Informed written consent was obtained from the patient after a discussion of the risks, benefits and alternatives to treatment. A timeout was performed prior to the initiation of the procedure. Initial ultrasound scanning demonstrates a large amount of ascites within the right lower abdominal quadrant. The right lower abdomen was prepped and draped in the usual sterile fashion. 1% lidocaine was used for local anesthesia. Following this, a 19 gauge, 7-cm, Yueh catheter was introduced. An ultrasound image was saved for documentation purposes. The paracentesis was performed. The catheter was removed and a dressing was applied. The patient tolerated the procedure well without immediate post procedural complication. FINDINGS: A total of approximately 2L of ascitic fluid was  removed. Samples were sent to the laboratory as requested by the clinical team. IMPRESSION: Successful ultrasound-guided paracentesis yielding 2 liters of peritoneal fluid. Additional ascites remain. Due to blood pressure, opted for conservative volume removal. Can readdress and remove additional fluid as needed on another day. Read and performed by: Alexandria Lodge, PA-C Electronically Signed   By: Lavonia Dana M.D.   On: 01/08/2022 11:10   [place] ASCITES (ABDOMEN LIMITED)  Result Date: 01/07/2022 CLINICAL DATA:  Cirrhosis, ascites EXAM: LIMITED ABDOMEN ULTRASOUND FOR ASCITES TECHNIQUE: Limited ultrasound survey for ascites was performed in all four abdominal quadrants. COMPARISON:  01/01/2022 FINDINGS: Significant ascites present in the abdomen. Volume of ascites is sufficient for paracentesis. Initially, paracentesis was delayed due to hypotension. With improvement in blood pressure, attempted paracentesis was then considered but patient declined procedure, preferring to wait until tomorrow. IMPRESSION: Sufficient ascites for paracentesis. Electronically Signed   By: Lavonia Dana M.D.   On: 01/07/2022 15:07   CT Head Wo Contrast  Result Date: 12/22/2021 CLINICAL DATA:  Mental status change, unknown cause EXAM: CT HEAD WITHOUT CONTRAST TECHNIQUE: Contiguous axial images were obtained from the base of the skull through the vertex without intravenous contrast. RADIATION DOSE REDUCTION: This exam was performed according to the departmental dose-optimization program which includes automated exposure control, adjustment of the mA and/or kV according to patient size and/or use of iterative reconstruction technique. COMPARISON:  None Available. FINDINGS: Brain: No acute intracranial abnormality. Specifically, no hemorrhage, hydrocephalus, mass lesion, acute infarction, or significant intracranial injury. Vascular: No hyperdense vessel or unexpected  calcification. Skull: No acute calvarial abnormality.  Sinuses/Orbits: No acute findings Other: None IMPRESSION: No acute intracranial abnormality. Electronically Signed   By: Rolm Baptise M.D.   On: 01/05/2022 23:54   DG Chest Port 1 View  Result Date: 12/24/2021 CLINICAL DATA:  Questionable sepsis - evaluate for abnormality EXAM: PORTABLE CHEST 1 VIEW COMPARISON:  Chest radiograph 10/31/2021, CT 09/09/2021 FINDINGS: Right chest port remains in place. Lung volumes are low. The heart is normal in size. Mild bibasilar atelectasis without confluent airspace disease. No pneumothorax or large pleural effusion. No acute osseous findings. IMPRESSION: Low lung volumes with mild bibasilar atelectasis. Electronically Signed   By: Keith Rake M.D.   On: 12/26/2021 23:54   US Paracentesis  Result Date: 01/01/2022 INDICATION: Cirrhosis; recurrent ascites EXAM: ULTRASOUND GUIDED LLQ PARACENTESIS MEDICATIONS: 10 cc 1% lidocaine COMPLICATIONS: None immediate. PROCEDURE: Informed written consent was obtained from the patient after a discussion of the risks, benefits and alternatives to treatment. A timeout was performed prior to the initiation of the procedure. Initial ultrasound scanning demonstrates a large amount of ascites within the left lower abdominal quadrant. The left lower abdomen was prepped and draped in the usual sterile fashion. 1% lidocaine was used for local anesthesia. Following this, a Yueh catheter was introduced. An ultrasound image was saved for documentation purposes. The paracentesis was performed. The catheter was removed and a dressing was applied. The patient tolerated the procedure well without immediate post procedural complication. Patient received post-procedure intravenous albumin; see nursing notes for details. FINDINGS: A total of approximately 2.6 liters of yellow fluid was removed. Samples were sent to the laboratory as requested by the clinical team. IMPRESSION: Successful ultrasound-guided paracentesis yielding 2.6 liters of  peritoneal fluid. The patient has previously been formally evaluated by the [facility] and is being actively followed for potential future intervention. Patient states Hospice has evaluated her and is now transitioning to their service. She says they have discussed possible PleurX catheter placement in near future. Read by Lavonia Drafts [facility] Electronically Signed   By: Lavonia Dana M.D.   On: 01/01/2022 14:01   US Paracentesis  Result Date: 12/26/2021 INDICATION: Cirrhosis; recurrent ascites EXAM: ULTRASOUND GUIDED LLQ PARACENTESIS MEDICATIONS: 10 cc 1% lidocaine. COMPLICATIONS: None immediate. PROCEDURE: Informed written consent was obtained from the patient after a discussion of the risks, benefits and alternatives to treatment. A timeout was performed prior to the initiation of the procedure. Initial ultrasound scanning demonstrates a large amount of ascites within the left lower abdominal quadrant. The left lower abdomen was prepped and draped in the usual sterile fashion. 1% lidocaine was used for local anesthesia. Following this, a Yueh catheter was introduced. An ultrasound image was saved for documentation purposes. The paracentesis was performed. The catheter was removed and a dressing was applied. The patient tolerated the procedure well without immediate post procedural complication. Patient received post-procedure intravenous albumin; see nursing notes for details. FINDINGS: A total of approximately 5.6 liters of dark yellow fluid was removed. Samples were sent to the laboratory as requested by the clinical team. IMPRESSION: Successful ultrasound-guided paracentesis yielding 5.6 liters of peritoneal fluid. PLAN: The patient has previously been formally evaluated by the [facility] and is being actively followed for potential future intervention. Patient states Hospice has evaluated her and is now  transitioning to their service. She says they have discussed possible PleurX catheter placement in near future. Read by Lavonia Drafts [facility] Electronically Signed  By: San Morelle M.D.   On: 12/26/2021 14:56   US Paracentesis  Result Date: 12/19/2021 INDICATION: Cirrhosis, ascites EXAM: ULTRASOUND GUIDED DIAGNOSTIC AND THERAPEUTIC PARACENTESIS MEDICATIONS: None. COMPLICATIONS: None immediate. PROCEDURE: Informed written consent was obtained from the patient after a discussion of the risks, benefits and alternatives to treatment. A timeout was performed prior to the initiation of the procedure. Initial ultrasound scanning demonstrates a large amount of ascites within the right lower abdominal quadrant. The right lower abdomen was prepped and draped in the usual sterile fashion. 1% lidocaine was used for local anesthesia. Following this, a 19 gauge, 7-cm, Yueh catheter was introduced. An ultrasound image was saved for documentation purposes. The paracentesis was performed. The catheter was removed and a dressing was applied. The patient tolerated the procedure well without immediate post procedural complication. Patient received post-procedure intravenous albumin; see nursing notes for details. FINDINGS: A total of approximately 4.3 L of yellow ascitic fluid was removed. Samples were sent to the laboratory as requested by the clinical team. IMPRESSION: Successful ultrasound-guided paracentesis yielding 4.3 liters of peritoneal fluid. Electronically Signed   By: Lavonia Dana M.D.   On: 12/19/2021 14:34    Microbiology: Recent Results (from the past 240 hour(s))  Blood Culture (routine x 2)     Status: None   Collection Time: 01/05/2022 11:36 PM   Specimen: BLOOD LEFT ARM  Result Value Ref Range Status   Specimen Description Peripheral BLOOD LEFT ARM  Final   Special Requests   Final    BOTTLES DRAWN AEROBIC AND ANAEROBIC Blood Culture results may not be optimal due to an excessive volume of blood  received in culture bottles   Culture   Final    NO GROWTH 5 DAYS Performed at [facility], [address], [place], [place] [address]    Report Status 01/11/2022 FINAL  Final  Blood Culture (routine x 2)     Status: None   Collection Time: 12/26/2021 11:36 PM   Specimen: BLOOD LEFT HAND  Result Value Ref Range Status   Specimen Description Peripheral BLOOD LEFT HAND  Final   Special Requests   Final    BOTTLES DRAWN AEROBIC AND ANAEROBIC Blood Culture adequate volume   Culture   Final    NO GROWTH 5 DAYS Performed at [facility], [address] [place], [place] [address]    Report Status 01/11/2022 FINAL  Final  Urine Culture     Status: Abnormal   Collection Time: 01/07/22  1:26 AM   Specimen: Urine, Clean Catch  Result Value Ref Range Status   Specimen Description   Final    URINE, CLEAN CATCH Performed at [facility], [address], [place], [place] [address]    Special Requests   Final    NONE Performed at [facility], [address], [place], [place] [address]    Culture >=100,000 COLONIES/mL PROVIDENCIA RETTGERI (A)  Final   Report Status 01/09/2022 FINAL  Final   Organism ID, Bacteria PROVIDENCIA RETTGERI (A)  Final      Susceptibility   Providencia rettgeri - MIC*    AMPICILLIN RESISTANT Resistant     CEFAZOLIN >=64 RESISTANT Resistant     CEFEPIME <=0.12 SENSITIVE Sensitive     CEFTRIAXONE <=0.25 SENSITIVE Sensitive     CIPROFLOXACIN <=0.25 SENSITIVE Sensitive     GENTAMICIN <=1 SENSITIVE Sensitive     IMIPENEM 1 SENSITIVE Sensitive     NITROFURANTOIN 128 RESISTANT Resistant     TRIMETH/SULFA <=20 SENSITIVE Sensitive  AMPICILLIN/SULBACTAM <=2 SENSITIVE Sensitive     PIP/TAZO <=4 SENSITIVE Sensitive     * >=100,000 COLONIES/mL PROVIDENCIA RETTGERI  MRSA Next Gen by PCR, Nasal     Status: None   Collection Time: 01/07/22  8:31 AM   Specimen: Nasal Mucosa; Nasal Swab  Result Value Ref Range Status   MRSA by PCR Next Gen NOT DETECTED NOT DETECTED Final     Comment: (NOTE) The GeneXpert MRSA Assay (FDA approved for NASAL specimens only), is one component of a comprehensive MRSA colonization surveillance program. It is not intended to diagnose MRSA infection nor to guide or monitor treatment for MRSA infections. Test performance is not FDA approved in patients less than 57 years old. Performed at [facility], [address] [place], [place] [address]   Culture, body fluid w Gram Stain-bottle     Status: None (Preliminary result)   Collection Time: 01/08/22  9:30 AM   Specimen: Peritoneal Washings  Result Value Ref Range Status   Specimen Description PERITONEAL  Final   Special Requests NONE  Final   Culture   Final    NO GROWTH 3 DAYS Performed at [facility], [address], [place], [place] [address]    Report Status PENDING  Incomplete  Gram stain     Status: None   Collection Time: 01/08/22  9:30 AM   Specimen: Peritoneal Washings  Result Value Ref Range Status   Specimen Description PERITONEAL  Final   Special Requests NONE  Final   Gram Stain   Final    NO ORGANISMS SEEN WBC PRESENT,BOTH PMN AND MONONUCLEAR CYTOSPIN SMEAR PERFORMED AT [facility] Performed at [facility], [address] [place], [place] [address]    Report Status 01/08/2022 FINAL  Final    Time spent: 30 minutes  Signed: Orson Eva, MD 2022-01-31

## 2022-02-07 DEATH — deceased

## 2022-02-17 ENCOUNTER — Ambulatory Visit: Payer: Medicare Other | Admitting: Gastroenterology

## 2022-02-27 ENCOUNTER — Ambulatory Visit: Payer: Medicare Other | Admitting: Gastroenterology

## 2022-06-05 ENCOUNTER — Encounter (INDEPENDENT_AMBULATORY_CARE_PROVIDER_SITE_OTHER): Payer: Medicare Other | Admitting: Ophthalmology

## 2022-11-20 IMAGING — DX DG CHEST 2V
2 series · 2 of 2 positions shown · non-contrast
Comparison: Chest radiograph 09/19/2020

CLINICAL DATA: Shortness of breath. Bilateral lower extremity
edema. Cirrhosis.

EXAM:
CHEST - 2 VIEW

[chest lat]
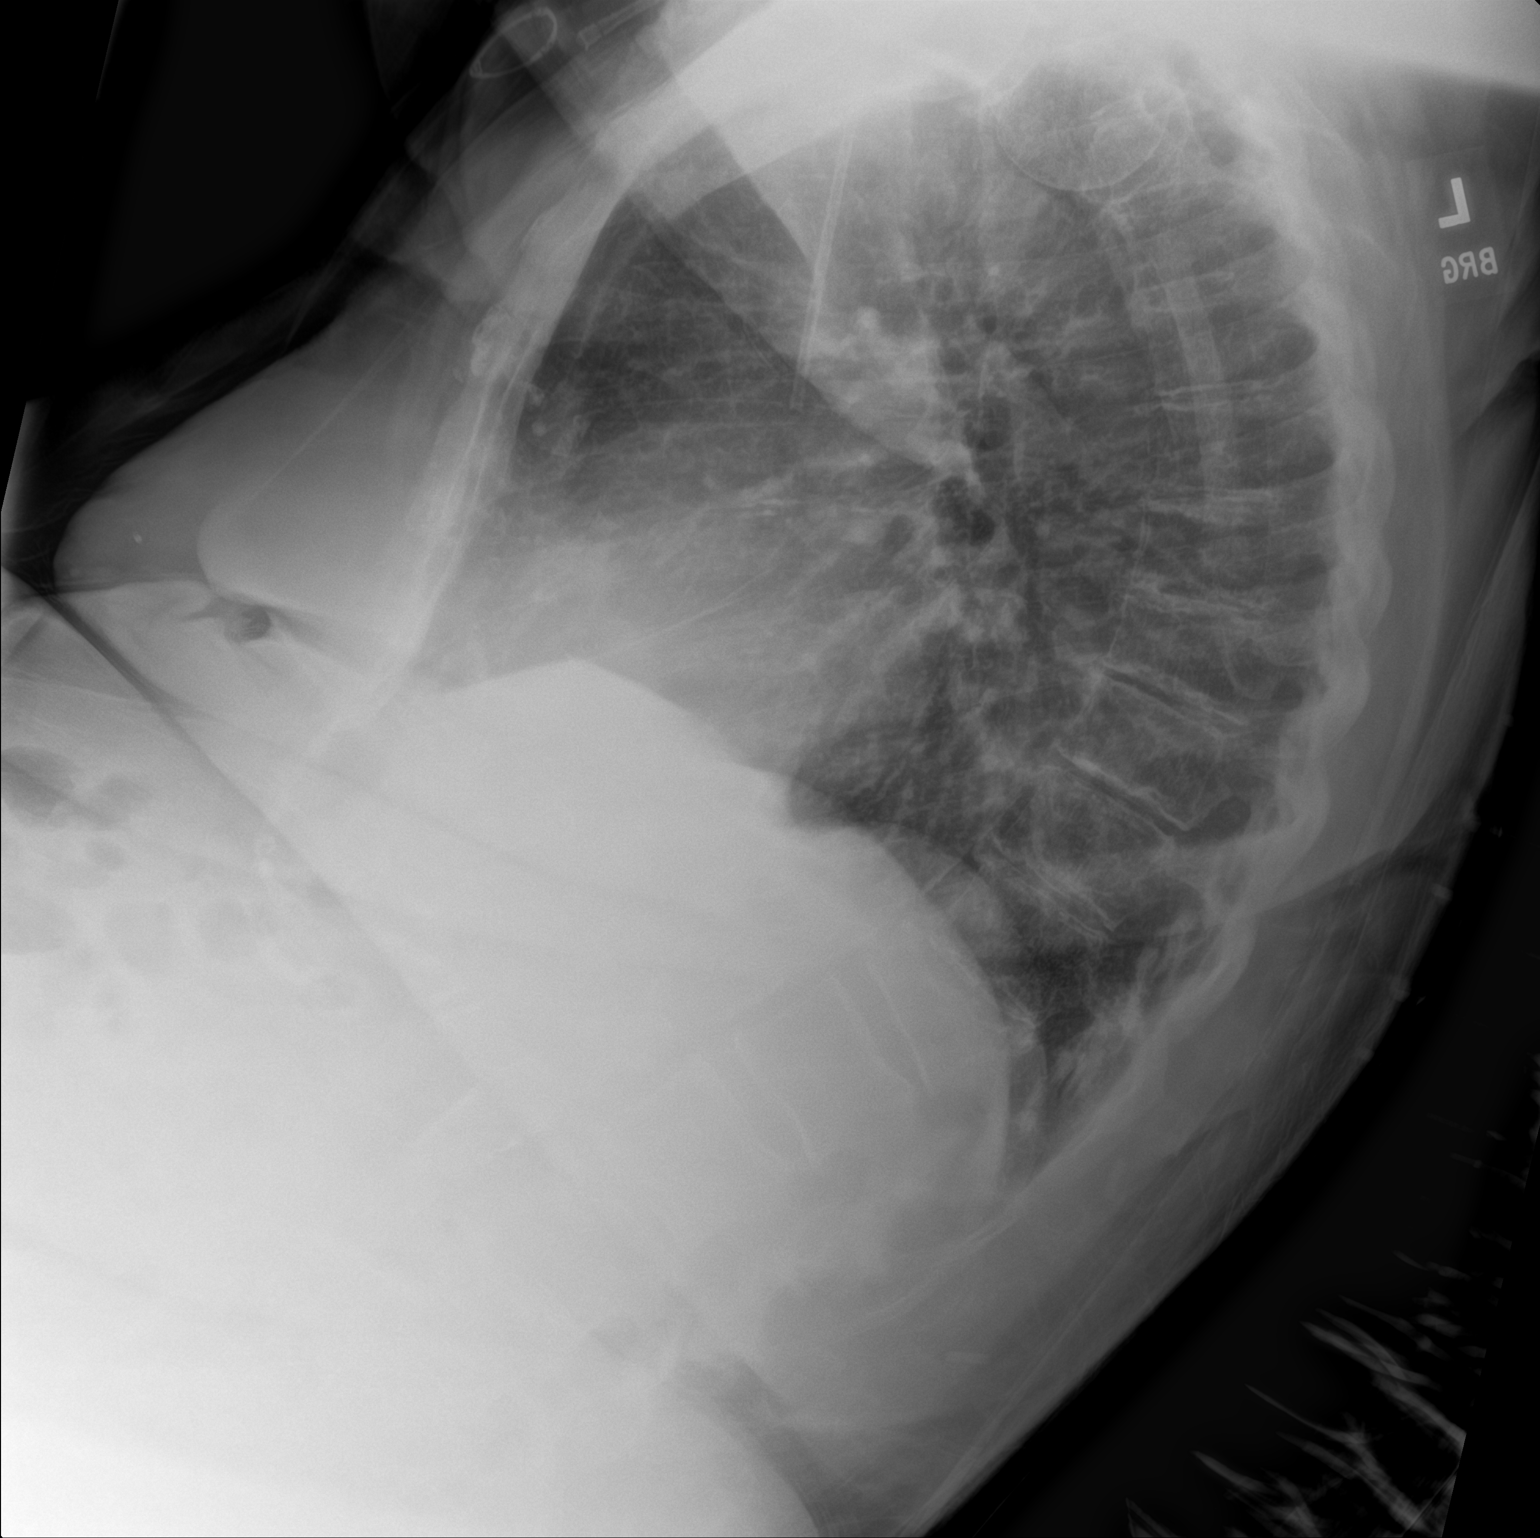

[chest ap]
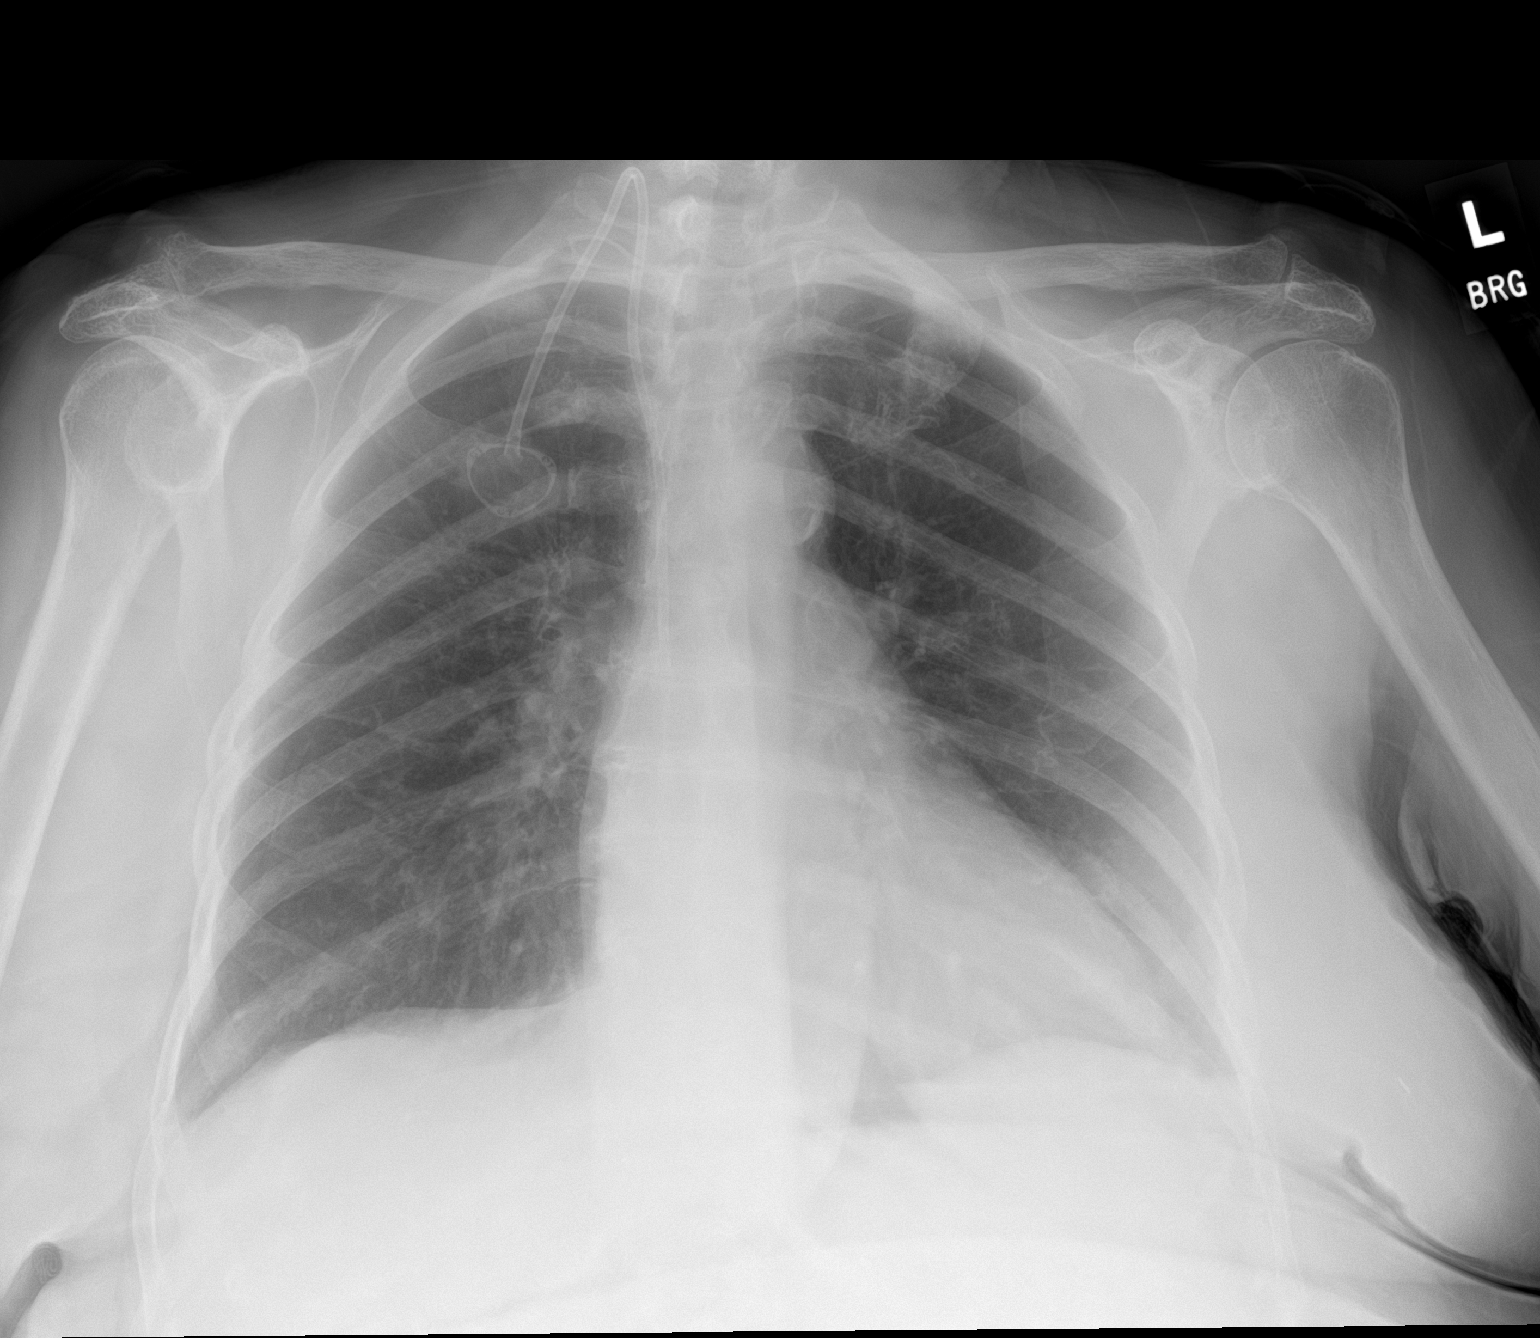

[2 of 2 positions shown; findings below may reference images not displayed]

FINDINGS: Right chest port with tip in the mid SVC. The heart is normal in
size. Unchanged mediastinal contours, aortic atherosclerosis. There
may be a trace bilateral pleural effusions. No pulmonary edema. No
focal airspace disease. No pneumothorax. No acute osseous
abnormalities are seen.
IMPRESSION: Possible trace bilateral pleural effusions.

## 2022-11-21 IMAGING — US US EXTREM LOW VENOUS*R*
1 series · 14 of 24 positions shown · non-contrast
Comparison: None.

CLINICAL DATA: RIGHT lower extremity swelling

EXAM:
RIGHT LOWER EXTREMITY VENOUS DOPPLER ULTRASOUND
TECHNIQUE: Gray-scale sonography with compression, as well as color and duplex
ultrasound, were performed to evaluate the deep venous system(s)
from the level of the common femoral vein through the popliteal and
proximal calf veins.

[Series 1: us venous img lower uni right (dvt) · portal-venous · 14 of 46 slices shown]
[im 1/46]
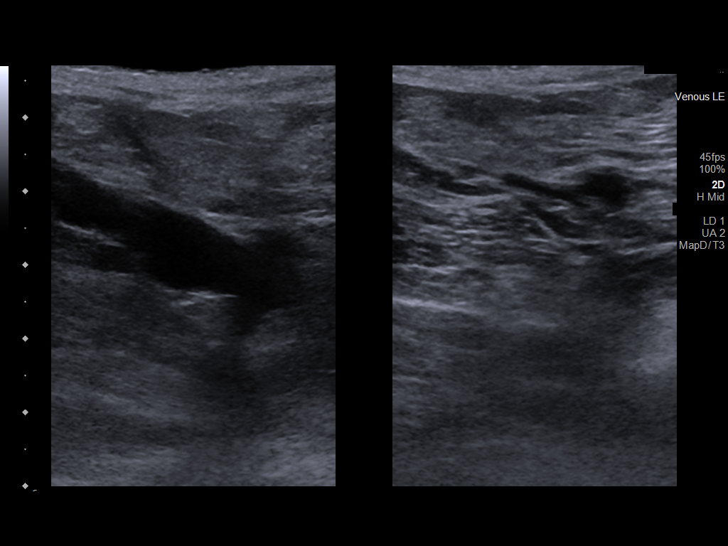
[im 4/46]
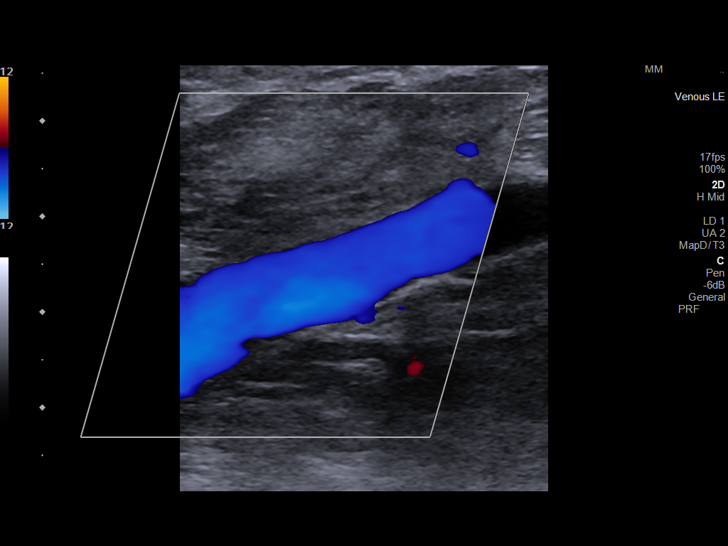
[im 8/46]
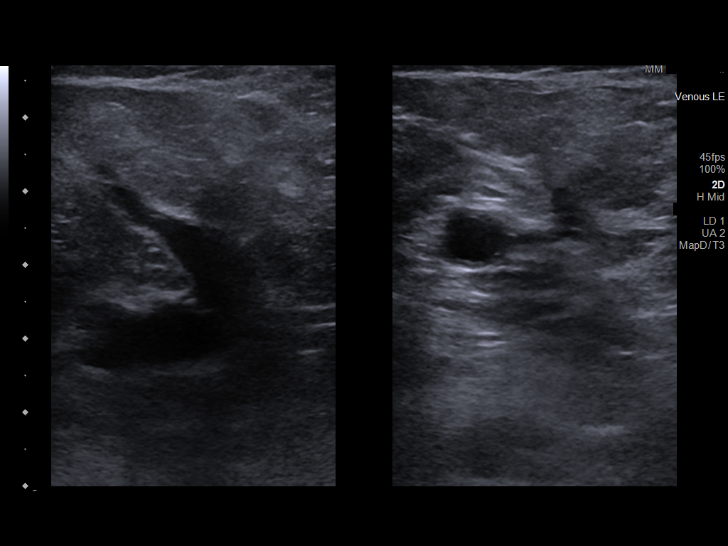
[im 12/46]
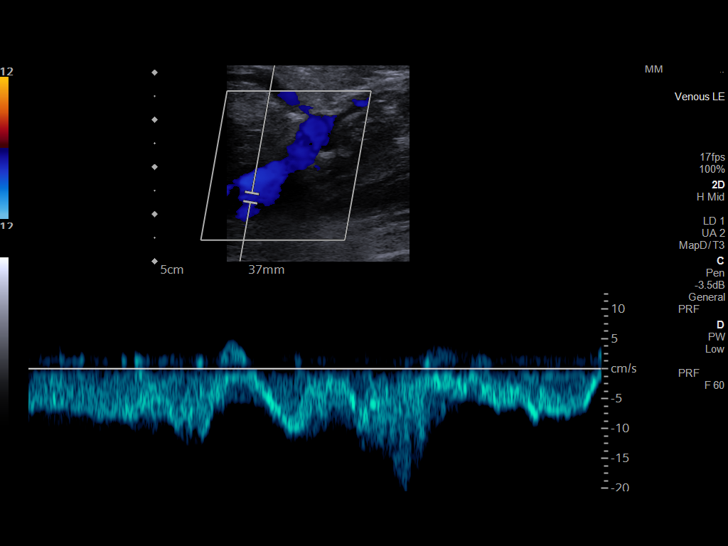
[im 14/46]
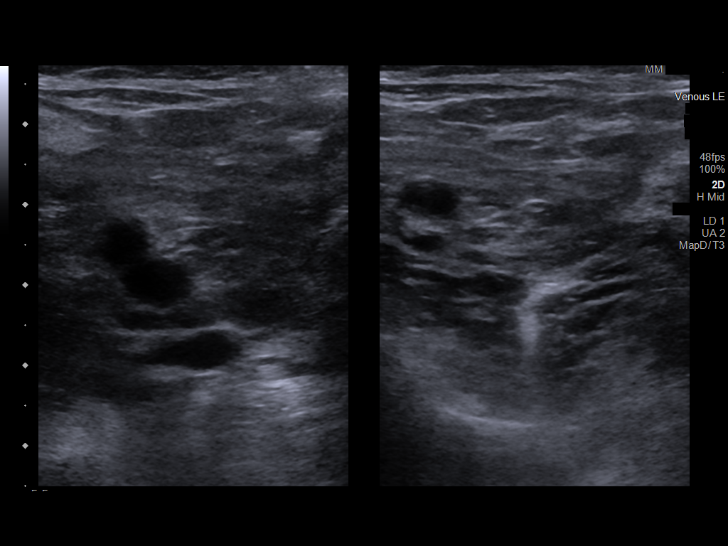
[im 18/46]
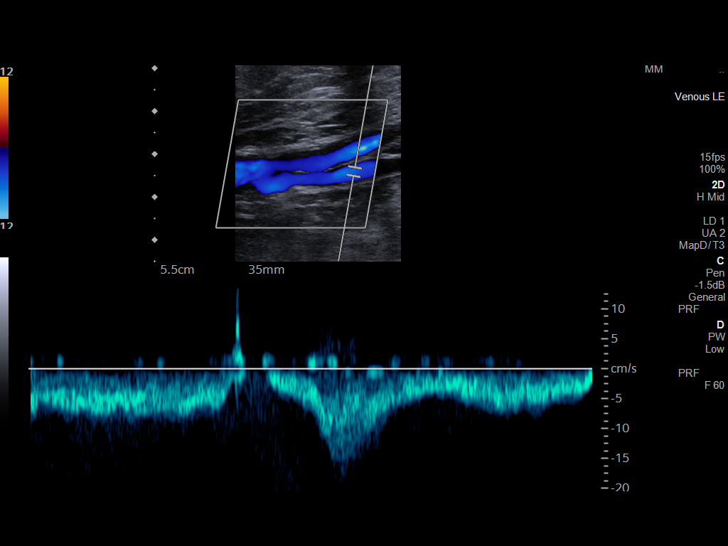
[im 22/46]
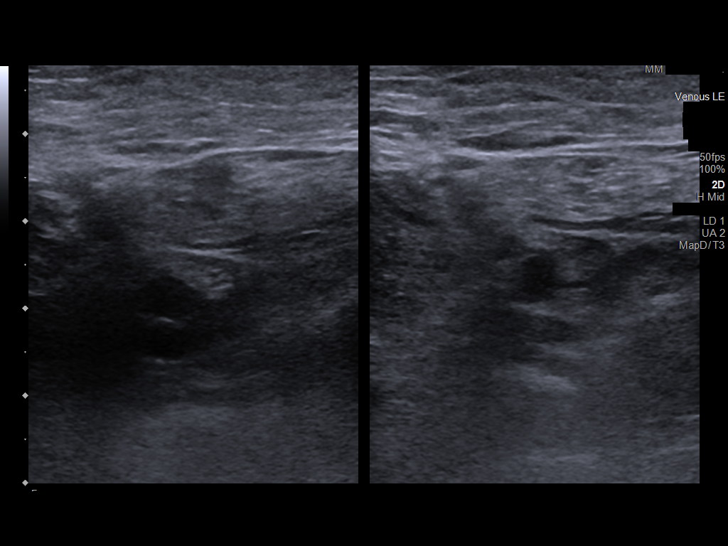
[im 24/46]
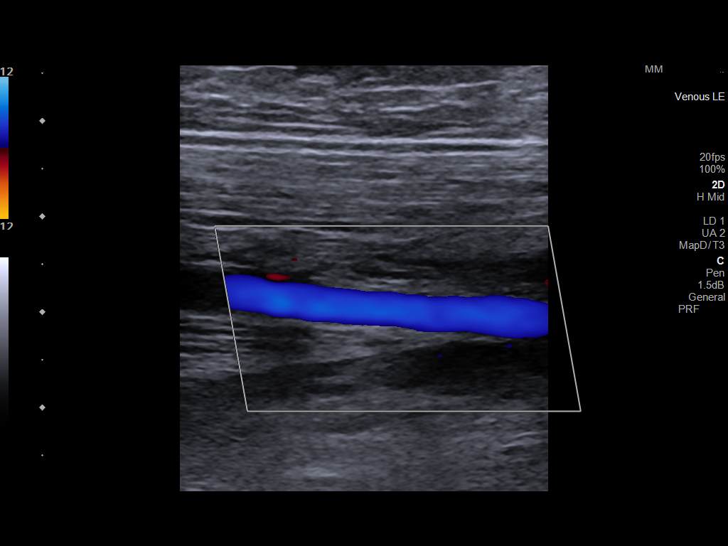
[im 28/46]
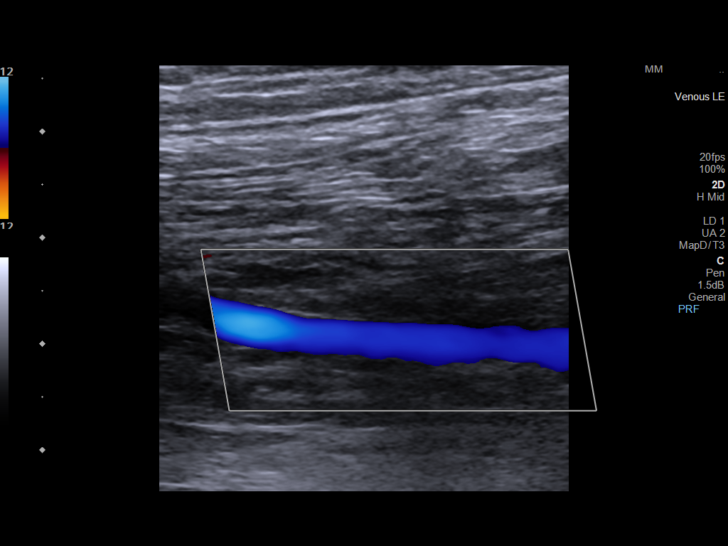
[im 32/46]
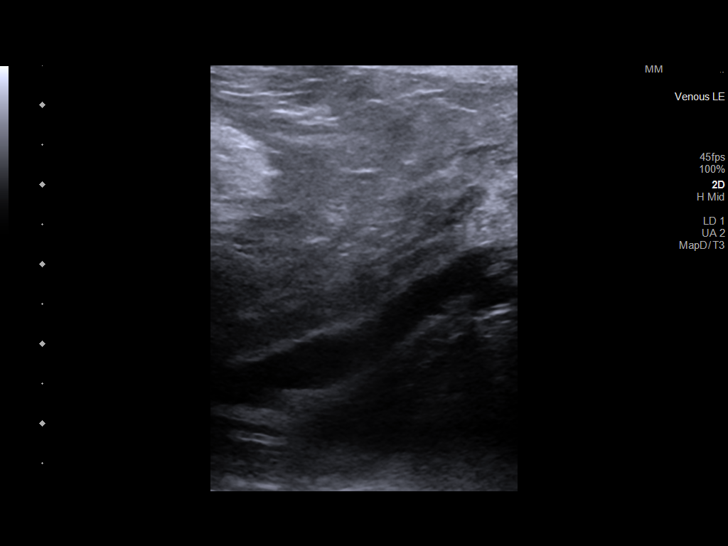
[im 36/46]
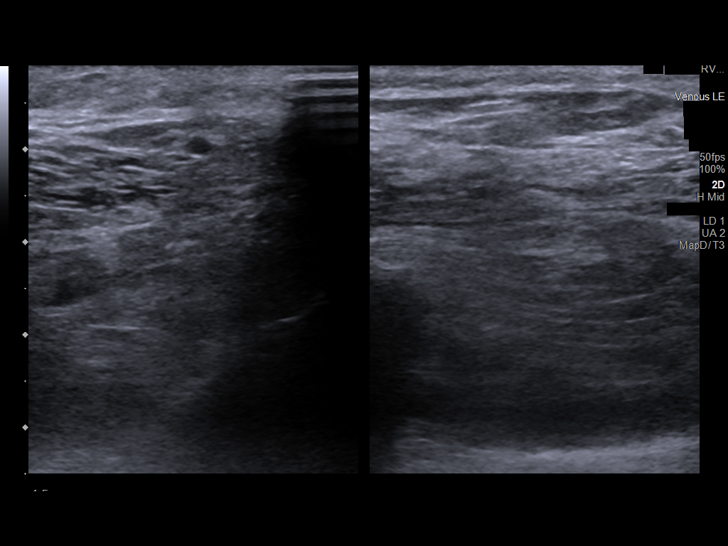
[im 38/46]
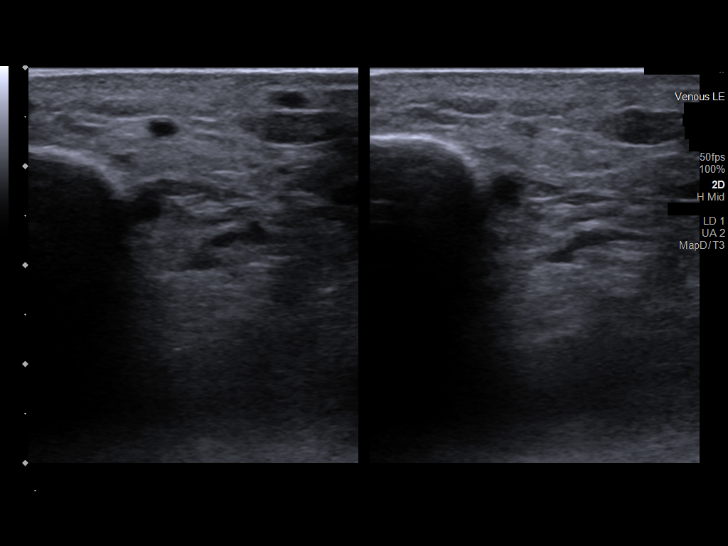
[im 42/46]
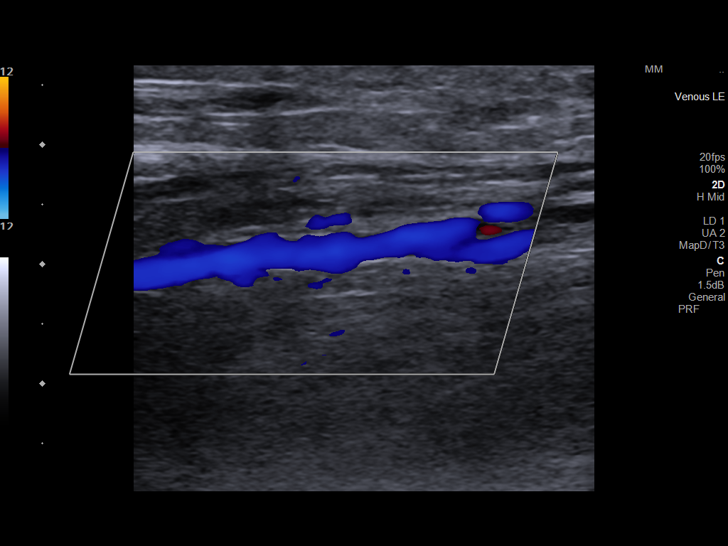
[im 46/46]
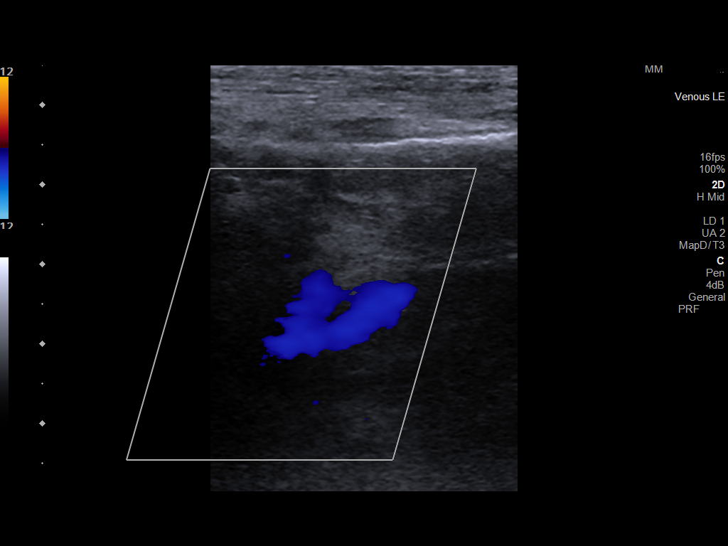

[14 of 24 positions shown; findings below may reference images not displayed]

FINDINGS: VENOUS

Normal compressibility of the common femoral, superficial femoral,
and popliteal veins, as well as the visualized calf veins.
Visualized portions of profunda femoral vein and great saphenous
vein unremarkable. No filling defects to suggest DVT on grayscale or
color Doppler imaging. Doppler waveforms show normal direction of
venous flow, normal respiratory plasticity and response to
augmentation.

Limited views of the contralateral common femoral vein are
unremarkable.

OTHER

None.

Limitations: none
IMPRESSION: No evidence of RIGHT lower extremity deep venous thrombosis.
